# Patient Record
Sex: Female | Born: 1940
Health system: Southern US, Community
[De-identification: ages and names within clinical notes are randomized; demographics above are authoritative.]

## PROBLEM LIST (undated history)

## (undated) DIAGNOSIS — M1990 Unspecified osteoarthritis, unspecified site: Secondary | ICD-10-CM

## (undated) DIAGNOSIS — M109 Gout, unspecified: Secondary | ICD-10-CM

## (undated) DIAGNOSIS — I82409 Acute embolism and thrombosis of unspecified deep veins of unspecified lower extremity: Secondary | ICD-10-CM

## (undated) DIAGNOSIS — J45909 Unspecified asthma, uncomplicated: Secondary | ICD-10-CM

## (undated) DIAGNOSIS — I4891 Unspecified atrial fibrillation: Secondary | ICD-10-CM

## (undated) DIAGNOSIS — I639 Cerebral infarction, unspecified: Secondary | ICD-10-CM

## (undated) DIAGNOSIS — I739 Peripheral vascular disease, unspecified: Secondary | ICD-10-CM

## (undated) DIAGNOSIS — I219 Acute myocardial infarction, unspecified: Secondary | ICD-10-CM

## (undated) HISTORY — DX: Unspecified osteoarthritis, unspecified site: M19.90

## (undated) HISTORY — PX: FOOT SURGERY: SHX648

## (undated) HISTORY — DX: Peripheral vascular disease, unspecified: I73.9

## (undated) HISTORY — DX: Gout, unspecified: M10.9

## (undated) HISTORY — PX: JOINT REPLACEMENT: SHX530

## (undated) HISTORY — PX: SHOULDER SURGERY: SHX246

---

## 1981-12-26 HISTORY — PX: BREAST EXCISIONAL BIOPSY: SUR124

## 1998-04-16 ENCOUNTER — Encounter: Admission: RE | Admit: 1998-04-16 | Discharge: 1998-04-16 | Payer: Self-pay | Admitting: Obstetrics

## 1998-04-23 ENCOUNTER — Ambulatory Visit (HOSPITAL_COMMUNITY): Admission: RE | Admit: 1998-04-23 | Discharge: 1998-04-23 | Payer: Self-pay | Admitting: Obstetrics

## 1998-09-04 ENCOUNTER — Ambulatory Visit (HOSPITAL_COMMUNITY): Admission: RE | Admit: 1998-09-04 | Discharge: 1998-09-04 | Payer: Self-pay | Admitting: Obstetrics

## 1998-10-01 ENCOUNTER — Other Ambulatory Visit: Admission: RE | Admit: 1998-10-01 | Discharge: 1998-10-01 | Payer: Self-pay | Admitting: Obstetrics

## 1998-10-01 ENCOUNTER — Encounter: Admission: RE | Admit: 1998-10-01 | Discharge: 1998-10-01 | Payer: Self-pay | Admitting: Obstetrics

## 1998-10-01 ENCOUNTER — Ambulatory Visit (HOSPITAL_COMMUNITY): Admission: RE | Admit: 1998-10-01 | Discharge: 1998-10-01 | Payer: Self-pay | Admitting: Obstetrics

## 1998-11-05 ENCOUNTER — Encounter: Admission: RE | Admit: 1998-11-05 | Discharge: 1998-11-05 | Payer: Self-pay | Admitting: Obstetrics

## 1999-01-18 ENCOUNTER — Inpatient Hospital Stay (HOSPITAL_COMMUNITY): Admission: AD | Admit: 1999-01-18 | Discharge: 1999-01-20 | Payer: Self-pay | Admitting: Cardiology

## 1999-01-18 ENCOUNTER — Encounter: Payer: Self-pay | Admitting: Cardiology

## 1999-02-18 ENCOUNTER — Encounter: Admission: RE | Admit: 1999-02-18 | Discharge: 1999-02-18 | Payer: Self-pay | Admitting: Obstetrics

## 1999-03-04 ENCOUNTER — Encounter: Admission: RE | Admit: 1999-03-04 | Discharge: 1999-03-04 | Payer: Self-pay | Admitting: Obstetrics

## 1999-03-11 ENCOUNTER — Encounter: Admission: RE | Admit: 1999-03-11 | Discharge: 1999-03-11 | Payer: Self-pay | Admitting: Obstetrics

## 1999-03-18 ENCOUNTER — Encounter: Admission: RE | Admit: 1999-03-18 | Discharge: 1999-03-18 | Payer: Self-pay | Admitting: Obstetrics

## 1999-09-13 ENCOUNTER — Ambulatory Visit (HOSPITAL_COMMUNITY): Admission: RE | Admit: 1999-09-13 | Discharge: 1999-09-13 | Payer: Self-pay | Admitting: Obstetrics

## 1999-09-23 ENCOUNTER — Encounter: Admission: RE | Admit: 1999-09-23 | Discharge: 1999-09-23 | Payer: Self-pay | Admitting: Obstetrics

## 1999-10-21 ENCOUNTER — Encounter: Admission: RE | Admit: 1999-10-21 | Discharge: 1999-10-21 | Payer: Self-pay | Admitting: Obstetrics

## 1999-11-09 ENCOUNTER — Encounter: Payer: Self-pay | Admitting: Obstetrics

## 1999-11-09 ENCOUNTER — Ambulatory Visit (HOSPITAL_COMMUNITY): Admission: RE | Admit: 1999-11-09 | Discharge: 1999-11-09 | Payer: Self-pay | Admitting: Obstetrics

## 2000-03-09 ENCOUNTER — Encounter: Admission: RE | Admit: 2000-03-09 | Discharge: 2000-03-09 | Payer: Self-pay | Admitting: Obstetrics

## 2000-03-16 ENCOUNTER — Encounter: Admission: RE | Admit: 2000-03-16 | Discharge: 2000-03-16 | Payer: Self-pay | Admitting: Obstetrics

## 2000-03-23 ENCOUNTER — Encounter: Admission: RE | Admit: 2000-03-23 | Discharge: 2000-03-23 | Payer: Self-pay | Admitting: Obstetrics

## 2000-09-14 ENCOUNTER — Ambulatory Visit (HOSPITAL_COMMUNITY): Admission: RE | Admit: 2000-09-14 | Discharge: 2000-09-14 | Payer: Self-pay | Admitting: Obstetrics

## 2000-09-21 ENCOUNTER — Other Ambulatory Visit: Admission: RE | Admit: 2000-09-21 | Discharge: 2000-09-21 | Payer: Self-pay | Admitting: Obstetrics

## 2000-09-21 ENCOUNTER — Encounter: Admission: RE | Admit: 2000-09-21 | Discharge: 2000-09-21 | Payer: Self-pay | Admitting: Obstetrics

## 2000-10-26 ENCOUNTER — Encounter: Payer: Self-pay | Admitting: Obstetrics

## 2000-10-26 ENCOUNTER — Ambulatory Visit (HOSPITAL_COMMUNITY): Admission: RE | Admit: 2000-10-26 | Discharge: 2000-10-26 | Payer: Self-pay | Admitting: Obstetrics

## 2001-01-09 ENCOUNTER — Encounter: Admission: RE | Admit: 2001-01-09 | Discharge: 2001-01-09 | Payer: Self-pay | Admitting: Obstetrics & Gynecology

## 2001-02-27 ENCOUNTER — Ambulatory Visit (HOSPITAL_COMMUNITY): Admission: RE | Admit: 2001-02-27 | Discharge: 2001-02-27 | Payer: Self-pay | Admitting: Gastroenterology

## 2001-06-04 ENCOUNTER — Ambulatory Visit (HOSPITAL_COMMUNITY): Admission: RE | Admit: 2001-06-04 | Discharge: 2001-06-04 | Payer: Self-pay | Admitting: Cardiology

## 2001-06-04 ENCOUNTER — Encounter: Payer: Self-pay | Admitting: Cardiology

## 2001-09-18 ENCOUNTER — Ambulatory Visit (HOSPITAL_COMMUNITY): Admission: RE | Admit: 2001-09-18 | Discharge: 2001-09-18 | Payer: Self-pay | Admitting: Obstetrics

## 2001-10-04 ENCOUNTER — Encounter: Admission: RE | Admit: 2001-10-04 | Discharge: 2001-10-04 | Payer: Self-pay | Admitting: Obstetrics

## 2001-12-26 HISTORY — PX: BACK SURGERY: SHX140

## 2002-01-24 ENCOUNTER — Encounter: Admission: RE | Admit: 2002-01-24 | Discharge: 2002-01-24 | Payer: Self-pay | Admitting: Obstetrics

## 2002-02-01 ENCOUNTER — Ambulatory Visit (HOSPITAL_COMMUNITY): Admission: RE | Admit: 2002-02-01 | Discharge: 2002-02-01 | Payer: Self-pay | Admitting: *Deleted

## 2002-02-26 ENCOUNTER — Encounter: Payer: Self-pay | Admitting: Orthopedic Surgery

## 2002-03-01 ENCOUNTER — Inpatient Hospital Stay (HOSPITAL_COMMUNITY): Admission: EM | Admit: 2002-03-01 | Discharge: 2002-03-02 | Payer: Self-pay | Admitting: Orthopedic Surgery

## 2002-03-06 ENCOUNTER — Encounter: Admission: RE | Admit: 2002-03-06 | Discharge: 2002-06-04 | Payer: Self-pay | Admitting: Orthopedic Surgery

## 2002-06-05 ENCOUNTER — Encounter: Admission: RE | Admit: 2002-06-05 | Discharge: 2002-08-20 | Payer: Self-pay | Admitting: Orthopedic Surgery

## 2002-06-22 ENCOUNTER — Inpatient Hospital Stay (HOSPITAL_COMMUNITY): Admission: EM | Admit: 2002-06-22 | Discharge: 2002-06-25 | Payer: Self-pay | Admitting: General Surgery

## 2002-10-22 ENCOUNTER — Encounter: Admission: RE | Admit: 2002-10-22 | Discharge: 2002-11-18 | Payer: Self-pay | Admitting: Orthopedic Surgery

## 2003-01-27 ENCOUNTER — Encounter: Payer: Self-pay | Admitting: Obstetrics

## 2003-01-27 ENCOUNTER — Ambulatory Visit (HOSPITAL_COMMUNITY): Admission: RE | Admit: 2003-01-27 | Discharge: 2003-01-27 | Payer: Self-pay | Admitting: Obstetrics

## 2003-03-03 ENCOUNTER — Ambulatory Visit (HOSPITAL_COMMUNITY): Admission: RE | Admit: 2003-03-03 | Discharge: 2003-03-03 | Payer: Self-pay | Admitting: Cardiology

## 2003-03-03 ENCOUNTER — Encounter: Payer: Self-pay | Admitting: Cardiology

## 2003-03-17 ENCOUNTER — Encounter: Payer: Self-pay | Admitting: Orthopedic Surgery

## 2003-03-19 ENCOUNTER — Encounter: Payer: Self-pay | Admitting: Orthopedic Surgery

## 2003-03-19 ENCOUNTER — Inpatient Hospital Stay (HOSPITAL_COMMUNITY): Admission: RE | Admit: 2003-03-19 | Discharge: 2003-03-24 | Payer: Self-pay | Admitting: Orthopedic Surgery

## 2003-04-14 ENCOUNTER — Encounter: Admission: RE | Admit: 2003-04-14 | Discharge: 2003-06-06 | Payer: Self-pay | Admitting: Orthopedic Surgery

## 2003-09-30 ENCOUNTER — Encounter: Admission: RE | Admit: 2003-09-30 | Discharge: 2003-10-30 | Payer: Self-pay | Admitting: Orthopedic Surgery

## 2003-12-01 ENCOUNTER — Ambulatory Visit (HOSPITAL_COMMUNITY): Admission: RE | Admit: 2003-12-01 | Discharge: 2003-12-01 | Payer: Self-pay | Admitting: Cardiology

## 2004-01-13 ENCOUNTER — Inpatient Hospital Stay (HOSPITAL_COMMUNITY): Admission: RE | Admit: 2004-01-13 | Discharge: 2004-01-17 | Payer: Self-pay | Admitting: Orthopedic Surgery

## 2004-03-02 ENCOUNTER — Encounter: Admission: RE | Admit: 2004-03-02 | Discharge: 2004-05-31 | Payer: Self-pay | Admitting: Orthopedic Surgery

## 2004-07-07 ENCOUNTER — Ambulatory Visit (HOSPITAL_COMMUNITY): Admission: RE | Admit: 2004-07-07 | Discharge: 2004-07-07 | Payer: Self-pay | Admitting: Obstetrics

## 2004-07-28 ENCOUNTER — Ambulatory Visit (HOSPITAL_COMMUNITY): Admission: RE | Admit: 2004-07-28 | Discharge: 2004-07-28 | Payer: Self-pay | Admitting: Cardiology

## 2004-08-13 ENCOUNTER — Inpatient Hospital Stay (HOSPITAL_COMMUNITY): Admission: RE | Admit: 2004-08-13 | Discharge: 2004-08-25 | Payer: Self-pay | Admitting: Specialist

## 2005-02-17 ENCOUNTER — Encounter: Admission: RE | Admit: 2005-02-17 | Discharge: 2005-02-17 | Payer: Self-pay | Admitting: Specialist

## 2005-04-14 ENCOUNTER — Encounter: Admission: RE | Admit: 2005-04-14 | Discharge: 2005-04-14 | Payer: Self-pay | Admitting: Nephrology

## 2005-07-13 ENCOUNTER — Encounter: Admission: RE | Admit: 2005-07-13 | Discharge: 2005-07-13 | Payer: Self-pay | Admitting: Unknown Physician Specialty

## 2005-10-13 ENCOUNTER — Ambulatory Visit (HOSPITAL_COMMUNITY): Admission: RE | Admit: 2005-10-13 | Discharge: 2005-10-13 | Payer: Self-pay | Admitting: Obstetrics

## 2006-08-09 ENCOUNTER — Ambulatory Visit (HOSPITAL_COMMUNITY): Admission: RE | Admit: 2006-08-09 | Discharge: 2006-08-09 | Payer: Self-pay | Admitting: Obstetrics

## 2006-08-17 ENCOUNTER — Encounter: Admission: RE | Admit: 2006-08-17 | Discharge: 2006-08-17 | Payer: Self-pay | Admitting: Obstetrics

## 2006-11-13 ENCOUNTER — Ambulatory Visit: Admission: RE | Admit: 2006-11-13 | Discharge: 2006-11-13 | Payer: Self-pay | Admitting: Orthopedic Surgery

## 2007-05-11 ENCOUNTER — Ambulatory Visit: Payer: Self-pay | Admitting: Vascular Surgery

## 2007-05-11 ENCOUNTER — Ambulatory Visit: Admission: RE | Admit: 2007-05-11 | Discharge: 2007-05-11 | Payer: Self-pay | Admitting: Orthopedic Surgery

## 2007-06-22 ENCOUNTER — Ambulatory Visit (HOSPITAL_COMMUNITY): Admission: RE | Admit: 2007-06-22 | Discharge: 2007-06-22 | Payer: Self-pay | Admitting: Orthopedic Surgery

## 2007-08-03 ENCOUNTER — Ambulatory Visit (HOSPITAL_COMMUNITY): Admission: RE | Admit: 2007-08-03 | Discharge: 2007-08-03 | Payer: Self-pay | Admitting: Orthopedic Surgery

## 2007-10-12 ENCOUNTER — Ambulatory Visit (HOSPITAL_COMMUNITY): Admission: RE | Admit: 2007-10-12 | Discharge: 2007-10-12 | Payer: Self-pay | Admitting: Internal Medicine

## 2007-11-07 ENCOUNTER — Ambulatory Visit (HOSPITAL_COMMUNITY): Admission: RE | Admit: 2007-11-07 | Discharge: 2007-11-07 | Payer: Self-pay | Admitting: Internal Medicine

## 2007-11-26 ENCOUNTER — Inpatient Hospital Stay (HOSPITAL_COMMUNITY): Admission: RE | Admit: 2007-11-26 | Discharge: 2007-11-29 | Payer: Self-pay | Admitting: Orthopedic Surgery

## 2008-11-07 ENCOUNTER — Encounter: Admission: RE | Admit: 2008-11-07 | Discharge: 2008-11-07 | Payer: Self-pay | Admitting: Internal Medicine

## 2009-08-21 ENCOUNTER — Emergency Department (HOSPITAL_COMMUNITY): Admission: EM | Admit: 2009-08-21 | Discharge: 2009-08-21 | Payer: Self-pay | Admitting: Emergency Medicine

## 2009-11-25 ENCOUNTER — Encounter: Admission: RE | Admit: 2009-11-25 | Discharge: 2009-11-25 | Payer: Self-pay | Admitting: Internal Medicine

## 2009-12-22 ENCOUNTER — Inpatient Hospital Stay (HOSPITAL_BASED_OUTPATIENT_CLINIC_OR_DEPARTMENT_OTHER): Admission: RE | Admit: 2009-12-22 | Discharge: 2009-12-22 | Payer: Self-pay | Admitting: Cardiology

## 2010-07-30 ENCOUNTER — Emergency Department (HOSPITAL_COMMUNITY): Admission: EM | Admit: 2010-07-30 | Discharge: 2010-07-30 | Payer: Self-pay | Admitting: Emergency Medicine

## 2010-09-23 ENCOUNTER — Encounter
Admission: RE | Admit: 2010-09-23 | Discharge: 2010-12-15 | Payer: Self-pay | Source: Home / Self Care | Attending: Orthopedic Surgery | Admitting: Orthopedic Surgery

## 2010-12-17 ENCOUNTER — Ambulatory Visit (HOSPITAL_COMMUNITY)
Admission: RE | Admit: 2010-12-17 | Discharge: 2010-12-17 | Payer: Self-pay | Source: Home / Self Care | Attending: Obstetrics | Admitting: Obstetrics

## 2010-12-31 ENCOUNTER — Ambulatory Visit: Admit: 2010-12-31 | Payer: Self-pay | Attending: Vascular Surgery | Admitting: Vascular Surgery

## 2010-12-31 ENCOUNTER — Ambulatory Visit
Admission: RE | Admit: 2010-12-31 | Discharge: 2010-12-31 | Payer: Self-pay | Source: Home / Self Care | Attending: Vascular Surgery | Admitting: Vascular Surgery

## 2011-01-04 ENCOUNTER — Ambulatory Visit
Admission: RE | Admit: 2011-01-04 | Discharge: 2011-01-04 | Payer: Self-pay | Source: Home / Self Care | Attending: Vascular Surgery | Admitting: Vascular Surgery

## 2011-01-13 ENCOUNTER — Ambulatory Visit (HOSPITAL_COMMUNITY)
Admission: RE | Admit: 2011-01-13 | Discharge: 2011-01-14 | Payer: Self-pay | Source: Home / Self Care | Attending: Vascular Surgery | Admitting: Vascular Surgery

## 2011-01-16 ENCOUNTER — Encounter: Payer: Self-pay | Admitting: Obstetrics

## 2011-01-16 ENCOUNTER — Encounter: Payer: Self-pay | Admitting: Unknown Physician Specialty

## 2011-01-16 NOTE — Op Note (Addendum)
[name], [name]         ACCOUNT NO.:  [id number]  MEDICAL RECORD NO.:  [id number]          PATIENT TYPE:  OIB  LOCATION:  6531                         FACILITY:  [facility]  PHYSICIAN:  Fransisco Hertz, MD       DATE OF BIRTH:  [birth year]  DATE OF PROCEDURE:  01/13/2011 DATE OF DISCHARGE:                              OPERATIVE REPORT   PROCEDURES: 1. Left common femoral artery cannulation under ultrasound guidance. 2. Aortogram. 3. Bilateral leg runoff. 4. Third-order arterial selection. 5. Angioplasty x2 of the right tibioperoneal trunk with 1.5-mm x 20-mm     balloon and a 2.5-mm x 18-mm balloon.  PREOPERATIVE DIAGNOSIS:  Right greater than left toe ischemia.  POSTOPERATIVE DIAGNOSIS:  Right greater than left toe ischemia.  SURGEON:  Fransisco Hertz, MD  ANESTHESIA:  Conscious sedation.  CONTRAST:  185 mL.  FINDINGS: 1. Patent aorta. 2. Patent bilateral renal arteries with bilateral nephrograms. 3. Patent bilateral common internal and external iliac arteries. 4. Patent bilateral common superficial and profunda arteries and also     patent bilateral popliteal artery. 5. The left trifurcation was patent. 6. The left tibial peroneal trunk was patent. 7. The left anterior tibial artery had a greater than 90% stenosis at     approximately one-third of the way down from its takeoff. 8. The left posterior tibial and peroneal arteries continued onto the     foot as runoff, but are very small.  The dominant runoff is     posterior tibial. 9. The right anterior tibial artery occludes shortly after takeoff. 10.The right tibioperoneal trunk has greater than 90% stenosis     proximal to its bifurcation into the peroneal and posterior tibial     arteries. 11.The right peroneal and posterior tibial arteries continue onto the     foot with the posterior tibial being the dominant runoff, but these     vessels are very small. 12.Resolution of tibial peroneal trunk stenosis after  angioplasty x2.  INDICATIONS:  This is a 70 year old female who presents with right toe wound after toe nail removal that are not healing.  Additionally on the left side, she has some ischemic changes in one of her toes on the left side.  The patient was more symptomatic with the right foot, so we discussed intervening on her right leg first.  She is aware of the risks of these procedure including possible access complication, possible nephrotoxicity, possible embolization, and possible need for emergent surgical intervention. The patient is aware of these risks and agrees to proceed forward with the procedure.  DESCRIPTION OF OPERATION:  After full informed written consent had been obtained from the patient, she was brought back to the angio suite and placed supine upon the angio table.  She was connected to the monitoring equipment and then she was given conscious sedation, the amounts of which were documented in the chart.  She was then prepped and draped in a standard fashion for aortogram and bilateral leg runoff.  I turned first my attention to the left groin under ultrasound guidance.  I identified the left common femoral artery that was cannulated with 18 gauge  needle and a Bentson wire was passed up into the aorta.  The needle was then exchanged for a 5-French sheath.  Then, an Omni flush catheter was loaded over the wire up the level of L1.  The wire was removed.  The catheter was connected to power injector circuit, and a power injector aortogram was completed, the findings of which are listed above.  Based on these findings, I then pulled down the catheter down to just proximal to the iliac bifurcation. Bilateral leg runoffs were then completed in an automated fashion, findings of which are listed above.  Based on the findings, I felt that she does not have a surgical option in her right leg due to the small  size of any target vessels and due to the changes in her toes if  she did  not have an attempt at angioplasty of the tibioperoneal trunk she would possibly require amputation of the right foot, so at this point I decided to make an attempt the intervening on the right tibioperoneal trunk.  At this point, using a Glidewire and the Omni flush catheter I was able to navigate through the right iliac artery system and then I was able to advance the catheter to the level of the external iliac artery.  I, at this point, connected the catheter back to the power injector circuit and did a couple of power injector runs to conform the tibioperoneal findings. At this point, I then disconnected the catheter, put the wire back down, and advanced the catheter down into the superficial femoral artery.  At this point, the wire was exchanged for [facility] wire.  The catheter was removed.  The 5-French sheath was then exchanged for a 6-French Ansel sheath which was advanced down into the superficial femoral artery.   Please note that after placement of the Ansel sheath, I gave 6000 units  of heparin for a therapeutic bolus of anticoagulation.  The end-hole catheter then was loaded over the Rosen wire and advanced the Tarrant wire down to about the mid SFA.  I removed the Rosen wire exchanging it for a 0.018-inch Spartacore wire which I used to navigate down into the posterior tibial artery.  At this point, then I performed a hand injection to confirm the position of the tibioperoneal trunk stenosis.  Initially, I loaded a 1.5-mm x 20-mm angioplasty balloon and centered it around the area of stenosis and inflated it to 10 atmospheres for a minute.  Due to the length of the lesion, I had to do 2 inflations to treat the entirety of this lesion, deflated the balloon, and then repeated a hand injection at this area after pulling the balloon back.  It demonstrated greater than 75% residual stenosis in the proximal portion of this tibial peroneal trunk stenosis, so at this point I  obtained a 2.5-mm x 18-mm angioplasty balloon and advanced it down to the level of this tibioperoneal trunk stenosis and inflated this twice to cover the entire length of this lesion, also up to 10 atmospheres at a minute hold at each inflation.  I deflated the balloon, and completed another hand injection.  There was still greater than 30% stenosis in the proximal segment of this stenosis, so I centered around this area of stenotic tibioperoneal trunk, inflated it to 14 atmospheres, held it for 1 minute, and deflated the balloon, pulled it back. Hand injection of this point demonstrated complete resolution of this tibioperoneal trunk stenosis with no obvious signs of dissection.  Due to  the fragile nature of the tibial arteries and a relatively small size of the peroneal and posterior tibial, I did not think any further intervention was possible at this point.  I pulled out the wire and the balloon.  I pulled back the sheath up into the aorta and down into the external iliac artery.  At this point, we aspirated the sheath and there was no clot, flushed it with heparinized saline.  I planned to remove it in the holding area once anticoagulation had reversed.    COMPLICATIONS:  None.  CONDITION:  Stable.     Fransisco Hertz, MD     BLC/MEDQ  D:  01/13/2011  T:  01/14/2011  Job:  [id number]  Electronically Signed by Leonides Sake MD on 01/16/2011 05:45:57 PM

## 2011-01-17 LAB — POCT I-STAT, CHEM 8
HCT: 47 % — ABNORMAL HIGH (ref 36.0–46.0)
Hemoglobin: 16 g/dL — ABNORMAL HIGH (ref 12.0–15.0)
Sodium: 144 mEq/L (ref 135–145)
TCO2: 34 mmol/L (ref 0–100)

## 2011-01-17 LAB — BASIC METABOLIC PANEL
CO2: 30 mEq/L (ref 19–32)
Calcium: 9.2 mg/dL (ref 8.4–10.5)
Chloride: 106 mEq/L (ref 96–112)
Creatinine, Ser: 0.8 mg/dL (ref 0.4–1.2)
GFR calc non Af Amer: >60 mL/min (ref >60)
Glucose, Bld: 104 mg/dL — ABNORMAL HIGH (ref 70–99)
Potassium: 3.7 mEq/L (ref 3.5–5.1)
Sodium: 142 mEq/L (ref 135–145)

## 2011-01-17 LAB — CBC
HCT: 37.4 % (ref 36.0–46.0)
MCH: 28.3 pg (ref 26.0–34.0)
Platelets: 166 10*3/uL (ref 150–400)
RBC: 4.21 MIL/uL (ref 3.87–5.11)

## 2011-01-17 LAB — GLUCOSE, CAPILLARY
Glucose-Capillary: 79 mg/dL (ref 70–99)
Glucose-Capillary: 83 mg/dL (ref 70–99)

## 2011-01-20 LAB — POCT ACTIVATED CLOTTING TIME
Activated Clotting Time: 288 seconds
Activated Clotting Time: 181 seconds
Activated Clotting Time: 164 seconds
Activated Clotting Time: 152 seconds
Activated Clotting Time: 152 seconds

## 2011-01-25 ENCOUNTER — Ambulatory Visit
Admission: RE | Admit: 2011-01-25 | Discharge: 2011-01-25 | Payer: Self-pay | Source: Home / Self Care | Attending: Vascular Surgery | Admitting: Vascular Surgery

## 2011-01-26 NOTE — Assessment & Plan Note (Signed)
OFFICE VISIT  [name], [name] DOB:  01-06-1941                                       01/25/2011 ZOXWR#:[id number]  The patient was evaluated by me on January 10 for some pre-ischemic changes of the third and fourth toes of the right foot, referred by Dr. Leticia Penna.  She was felt to have primarily tibial occlusive disease.  She did undergo angiography by Dr. Leonides Sake on January 19 and was found to have primarily tibial occlusive disease.  She did have a 90% stenosis in the right tibioperoneal trunk, which was treated with primary angioplasty with a reasonably good result.  Since that time she states that the foot has felt better but she continues to have some discomfort in the right third and fourth toes.  She had no gangrene or drainage, she says.  No symptoms in the left foot at the present time.  CHRONIC MEDICAL PROBLEMS: 1. Coronary artery disease. 2. Hypertension. 3. Previous CVA with right hemiparesis, resolved. 4. Type 2 diabetes mellitus. 5. GERD.  SOCIAL HISTORY:  The patient denies tobacco or alcohol use.  REVIEW OF SYSTEMS:  Negative for chest pain, dyspnea on exertion, PND, orthopnea, chronic bronchitis.  PHYSICAL EXAMINATION:  Blood pressure 147/74, heart rate 77, respirations 20.  General:  She is an elderly female in no apparent distress, alert and oriented x3.  Lungs:  Clear to auscultation. Cardiovascular:  Regular rhythm, no murmurs.  Abdomen:  Soft, nontender, with no masses.  Lower extremity exam reveals 3+ femoral and popliteal pulses bilaterally.  The right third and fourth toes are chronically ischemic but no evidence of any gangrene or ulceration.  She was reassured about the fact that this was treated with balloon angioplasty and that we have no further options available because of her distal tibial disease and small vessels.  She will return to the care Dr. Leticia Penna.  We will follow her on a regular basis in the  vascular lab but no further intervention is anticipated for this patient with severe tibial disease.    [name], M.D. Electronically Signed  JDL/MEDQ  D:  01/25/2011  T:  01/26/2011  Job:  [id number]

## 2011-02-08 ENCOUNTER — Other Ambulatory Visit (HOSPITAL_COMMUNITY): Payer: Self-pay

## 2011-02-08 ENCOUNTER — Ambulatory Visit (HOSPITAL_COMMUNITY): Payer: Medicare Other

## 2011-02-08 ENCOUNTER — Other Ambulatory Visit: Payer: Self-pay | Admitting: Anesthesiology

## 2011-02-08 DIAGNOSIS — Z01812 Encounter for preprocedural laboratory examination: Secondary | ICD-10-CM | POA: Insufficient documentation

## 2011-02-08 LAB — BASIC METABOLIC PANEL
CO2: 33 mEq/L — ABNORMAL HIGH (ref 19–32)
Creatinine, Ser: 0.79 mg/dL (ref 0.4–1.2)
GFR calc Af Amer: >60 mL/min (ref >60)
Glucose, Bld: 90 mg/dL (ref 70–99)
Sodium: 142 mEq/L (ref 135–145)

## 2011-02-09 ENCOUNTER — Ambulatory Visit (HOSPITAL_COMMUNITY)
Admission: RE | Admit: 2011-02-09 | Discharge: 2011-02-09 | Disposition: A | Discharge status: Home / Self Care | Payer: Medicare Other | Source: Ambulatory Visit | Attending: Gastroenterology | Admitting: Gastroenterology

## 2011-02-09 ENCOUNTER — Other Ambulatory Visit: Payer: Self-pay | Admitting: Gastroenterology

## 2011-02-09 DIAGNOSIS — D126 Benign neoplasm of colon, unspecified: Secondary | ICD-10-CM | POA: Insufficient documentation

## 2011-02-09 DIAGNOSIS — Z1211 Encounter for screening for malignant neoplasm of colon: Secondary | ICD-10-CM | POA: Insufficient documentation

## 2011-02-09 DIAGNOSIS — I4891 Unspecified atrial fibrillation: Secondary | ICD-10-CM | POA: Insufficient documentation

## 2011-02-09 DIAGNOSIS — I2510 Atherosclerotic heart disease of native coronary artery without angina pectoris: Secondary | ICD-10-CM | POA: Insufficient documentation

## 2011-02-09 DIAGNOSIS — I739 Peripheral vascular disease, unspecified: Secondary | ICD-10-CM | POA: Insufficient documentation

## 2011-02-09 DIAGNOSIS — I69959 Hemiplegia and hemiparesis following unspecified cerebrovascular disease affecting unspecified side: Secondary | ICD-10-CM | POA: Insufficient documentation

## 2011-02-09 DIAGNOSIS — K219 Gastro-esophageal reflux disease without esophagitis: Secondary | ICD-10-CM | POA: Insufficient documentation

## 2011-02-09 DIAGNOSIS — I10 Essential (primary) hypertension: Secondary | ICD-10-CM | POA: Insufficient documentation

## 2011-02-09 DIAGNOSIS — I252 Old myocardial infarction: Secondary | ICD-10-CM | POA: Insufficient documentation

## 2011-02-09 DIAGNOSIS — E119 Type 2 diabetes mellitus without complications: Secondary | ICD-10-CM | POA: Insufficient documentation

## 2011-02-09 DIAGNOSIS — Z01812 Encounter for preprocedural laboratory examination: Secondary | ICD-10-CM | POA: Insufficient documentation

## 2011-03-28 LAB — POCT I-STAT GLUCOSE
Glucose, Bld: 108 mg/dL — ABNORMAL HIGH (ref 70–99)
Operator id: 141321

## 2011-03-28 LAB — PROTIME-INR: Prothrombin Time: 14.2 seconds (ref 11.6–15.2)

## 2011-04-02 LAB — GLUCOSE, CAPILLARY: Glucose-Capillary: 115 mg/dL — ABNORMAL HIGH (ref 70–99)

## 2011-05-10 NOTE — Discharge Summary (Signed)
[name], [name]         ACCOUNT NO.:  [id number]   MEDICAL RECORD NO.:  [id number]          PATIENT TYPE:  INP   LOCATION:  5006                         FACILITY:  [facility]   PHYSICIAN:  Almedia Balls. Ranell Patrick, M.D. DATE OF BIRTH:  12-02-1941   DATE OF ADMISSION:  11/26/2007  DATE OF DISCHARGE:  11/29/2007                               DISCHARGE SUMMARY   ADMISSION DIAGNOSIS:  End-stage osteoarthritis arthritis to the right  knee.   DISCHARGE DIAGNOSIS:  End-stage osteoarthritis arthritis to the right  knee,  status post total knee arthroplasty.   BRIEF HISTORY:  The patient is a did a 70 year old female with worsening  right knee pain.  Patient has elected have a right total knee  arthroplasty by Dr. Malon Kindle   PROCEDURE:  The patient had a right total knee arthroplasty using the  [employer] Rotating Platform System on November 26, 2007.   ATTENDING SURGEON:  Malon Kindle, M.D.   ASSISTANT:  Standley Dakins PA-C.   ANESTHESIA:  General anesthesia was used.   ESTIMATED BLOOD LOSS:  Minimal.   COMPLICATIONS.:  None.   HOSPITAL COURSE:  The patient was admitted on November 26, 2007  and  underwent the above-stated procedure, which she tolerated well. After  adequate time in the post anesthesia care unit she was transferred up to  5000.  Postop day #1 the patient complained about moderate pain to that  right knee.  She was able to work with a CPM but unable work very much  with physical therapy.  The drain was removed.  No signs of occult  infection.  Postop day #2 the patient had a dressing change.  She was  able to progress more with physical therapy.  She did have tough time  with touchdown weightbearing with basic partial weightbearing and the  whole reason, we kept her partial weightbearing was due to an arthrotomy  we had to complete during surgery for exposure. The patient was able to  continue working with physical therapy. On postop day #3 her labs were  within  acceptable limits in regards to blood loss anemia. Dressings were  changed daily.  No signs or symptoms of erythema, infection or rash.  She was intact distally.   DISCHARGE/PLAN:  The patient will be discharged to a skilled nursing  facility on November 29, 2007.  Her condition is stable.  Her diet is low  sodium.   ALLERGIES:  The patient has allergies to PENICILLIN.   DISCHARGE MEDICATIONS:  1. Tenormin 25 mg p.o. daily.  2. Colace 100 mg p.o. b.i.d.  3. Cymbalta 60 mg p.o. daily.  4. Iron 325 mg p.o. t.i.d.  5. Advair 1 puff inhaler b.i.d.  6. Lasix 20 mg p.o. daily.  7. Acular drops 1 drop to each eye b.i.d.  8. Robaxin 500 mg p.o. every 6 hours.  9. Zocor 20 mg p.o. daily.  10.Coumadin per pharmacy protocol.  11.Albuterol 1 puff inhaled every 6 hours p.r.n.  12.Meclizine 25 mg p.o. t.i.d.  13.Percocet 5 mg 1-2 tablets daily for 4-6 hours p.r.n. pain.  14.Ambien 5 mg p.o. q.h.s.  15.Phenergan 25 mg  p.o. every 4 hours p.r.n. nausea.   FOLLOW-UP:  The patient will follow back up with Dr. Malon Kindle in 2  weeks.   ACTIVITY:  The patient is to be touchdown weightbearing or partial  weightbearing on the right lower extremity with a rolling walker with  physical therapy and a CPM until we see her back.  CPM is to be set at 0  to 60 degrees and progressed to 90 degrees and increasing at about 5 to  10 degrees daily per tolerance.      [name] Nora, P.A.      Almedia Balls. Ranell Patrick, M.D.  Electronically Signed    TBD/MEDQ  D:  11/29/2007  T:  11/29/2007  Job:  [id number]

## 2011-05-10 NOTE — H&P (Signed)
NAMETHORA, [name] NO.:  [id number]   MEDICAL RECORD NO.:  [id number]          PATIENT TYPE:  OUT   LOCATION:  [facility]                          FACILITY:  [facility]   PHYSICIAN:  Ralene Ok, M.D.      DATE OF BIRTH:  [birth year]   DATE OF ADMISSION:  11/07/2007  DATE OF DISCHARGE:  11/07/2007                              HISTORY & PHYSICAL   CHIEF COMPLAINT:  Right knee pain.   HISTORY OF PRESENT ILLNESS:  The patient has been having this worsening  right knee pain secondary to osteoarthritis that has been refractory to  conservative treatment.  The patient is elected to have right total knee  arthroplasty by Dr. Malon Kindle.   PAST MEDICAL HISTORY:  Significant for:  1. CVA with hemiparesis.  2. Myocardial infarction x2.  3. Angina.  4. GERD.  5. Osteoarthritis.   FAMILY MEDICAL HISTORY:  Hypertension and coronary artery disease.   SOCIAL HISTORY:  A patient of Dr. Hyacinth Meeker.  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  Does not smoke or use alcohol.   ALLERGIES:  PENICILLIN.   CURRENT MEDICATIONS:  1. Tenormin 25 mg one tab p.o. daily.  2. Lasix 20 mg one tab p.o. daily.  3. Micro potassium 10 mEq one tab p.o. daily.  4. Aspirin 325 mg one tab p.o. daily.  5. Zegerid 40 mg one tab p.o. daily.  6. Cymbalta 60 mg daily.  7. Ascorbic acid 250 mg daily.  8. Vicodin 5 mg one to two every 4-6 hours p.r.n. pain.  9. Ketorolac Acular 2 drops left eye b.i.d.  10.Depakote 250 mg p.o. b.i.d.  11.Risperdal 0.5 mg b.i.d.  12.Albuterol inhaler p.r.n.  13.Zocor 20 mg p.o. daily.  14.Ambien 5 mg nightly.  15.Sublingual nitroglycerin 0.4 mg p.r.n. chest pain.  16.Advair Diskus p.r.n.  17.Phenergan 25 mg every 4-6 hours p.r.n. nausea.  18.Robaxin 500 mg p.o. every 6 hours.  19.Meclizine 25 mg one tab p.o. t.i.d. p.r.n. dizziness.   PHYSICAL EXAMINATION:  VITAL SIGNS:  Pulse 90, respirations 16, blood  pressure 138/88.  GENERAL:  The patient is a 70 year old female,  adequate mood today.  No  signs of distress.  NECK:  Full range of motion without any tenderness.  NEURO:  Cranial nerves II-XII grossly intact.  CHEST:  Active breath sounds bilaterally.  No wheezes, rhonchi, or  rales.  HEART:  Shows regular rate and rhythm.  No murmur.  ABDOMEN:  Nontender, nondistended with active bowel sounds.  EXTREMITIES:  Shows moderate tenderness of the right knee, especially in  the medial joint line with mild crepitus.  Does have some mild pedal  edema bilateral lower extremities.  Capillary refill is less than 2  seconds.  She is demonstrating an antalgic gait.  No signs of rashes.   X-RAYS:  Showed a right knee end-stage osteoarthritis.   IMPRESSION:  Right knee end-stage osteoarthritis.   PLAN:  Plan of action is a right total knee arthroplasty by Dr. Malon Kindle.      Thomas B. Ferne Coe.    ______________________________  Ralene Ok, M.D.    TBD/MEDQ  D:  11/15/2007  T:  11/16/2007  Job:  [id number]

## 2011-05-10 NOTE — Procedures (Signed)
LOWER EXTREMITY ARTERIAL DUPLEX   INDICATION:  Lower extremity ulcer.   HISTORY:  Diabetes:  Yes.  Cardiac:  MI in 1995.  Hypertension:  Yes.  Smoking:  No.  Previous Surgery:  No.   SINGLE LEVEL ARTERIAL EXAM                          RIGHT                LEFT  Brachial:               145                  142  Anterior tibial:        93                   134  Posterior tibial:       112                  141  Peroneal:  Ankle/Brachial Index:   0.77                 0.97.   LOWER EXTREMITY ARTERIAL DUPLEX EXAM   TOE BRACHIAL INDEX RIGHT:  0.46.   TOE BRACHIAL INDEX LEFT:  0.57   DUPLEX:  Patent right lower extremity arterial system with elevated  velocities noted in the posterior tibial artery of 243 cm/second.   IMPRESSION:  1. Moderately diseased right ankle brachial index and toe brachial      index.  2. Left ankle brachial index within normal limits.  3. Patent right arterial system with biphasic waveforms in the thigh      and monophasic waveforms in the calf.   ___________________________________________  Quita Skye. Hart Rochester, M.D.   EM/MEDQ  D:  12/31/2010  T:  12/31/2010  Job:  [id number]

## 2011-05-10 NOTE — Op Note (Signed)
[name], HE         ACCOUNT NO.:  [id number]   MEDICAL RECORD NO.:  [id number]          PATIENT TYPE:  AMB   LOCATION:  SDS                          FACILITY:  [facility]   PHYSICIAN:  Almedia Balls. Ranell Patrick, M.D. DATE OF BIRTH:  21-Dec-1941   DATE OF PROCEDURE:  08/03/2007  DATE OF DISCHARGE:  08/03/2007                               OPERATIVE REPORT   PREOPERATIVE DIAGNOSIS:  Right knee medial meniscus tear.   POSTOPERATIVE DIAGNOSIS:  Right knee lateral meniscus tear and  arthritis.   PROCEDURE PERFORMED:  Right knee arthroscopy with partial lateral  meniscectomy, abrasion chondroplasty lateral femoral condyle.   SURGEON:  Almedia Balls. Ranell Patrick, M.D.   ASSISTANT:  None.   Local anesthesia plus monitored anesthesia care was used.   ESTIMATED BLOOD LOSS:  Minimal.   FLUID REPLACEMENT:  1,200 mL crystalloid.   INSTRUMENT COUNT:  Correct.   No complications.   Preoperative antibiotics were given.   INDICATIONS:  The patient is a 70 year old female with a history of  right lateral and medial knee pain.  The patient was noted to have  finding consistent with a potential meniscus tear.  In the office, the  patient failed conservative management consisting of multiple  injections, presents now for operative treatment for meniscal tear.  Informed consent was obtained.   DESCRIPTION OF PROCEDURE:  After an adequate level of anesthesia was  achieved, the patient was positioned supine on the operating room table.  The lateral post was utilized.  The right leg was sterilely prepped and  draped in the usual manner.  After sterile prep and drape of the leg, we  went ahead and entered the knee arthroscopically with the standard  arthroscopic portals.  Superolateral outflow and anterior lateral scope  and anteromedial working portals were all created in a similar fashion  with infiltration of skin with 0.25% Marcaine with epinephrine followed  by incision with the 11 blade scalpel in  the direction of the cannula in  the joint using blunt obturators.  Diagnostic arthroscopy revealed a  torn lateral meniscus which was a complex tear, nonrepairable.  This was  debrided back to stable meniscal tissue involving removal of the  majority of the posterior horn of the meniscus.  There was a disruption  in the continuity of the meniscus both with the tear and of course with  the meniscectomy.  Abrasion chondroplasty was performed on several deep  areas of partial thickness chondromalacia grade 3.  There was also an  area of grade 4 chondromalacia on the tibial condyle.  We performed an  abrasion chondroplasty in the lateral compartment only.  We performed  extensive anterior synovectomy given there was a lot of hypertrophic  synovium as well as potentially impinging plaque lesion medially, we  removed that.  Upon further inspection, there was some deep trochlea  wear grade 3 chondromalacia present there with some chondroplasty  performed in that area.  Medially she actually looked pretty good with  minimal chondromalacia and no sign of obvious meniscus tear.  ACL was  degenerative but intact.  PCL was intact.  Following the lateral  meniscus tear and  abrasion chondroplasty in the lateral compartment and  patellofemoral compartment, we concluded surgery.  We sutured the wounds  with 3-0 nylon, followed by sterile dressing.  The patient tolerated the  surgery well.      Almedia Balls. Ranell Patrick, M.D.  Electronically Signed     SRN/MEDQ  D:  08/03/2007  T:  08/04/2007  Job:  [id number]

## 2011-05-10 NOTE — Discharge Summary (Signed)
NAMEALEXIS, REBER         ACCOUNT NO.:  [id number]   MEDICAL RECORD NO.:  [id number]          PATIENT TYPE:  INP   LOCATION:  5006                         FACILITY:  [facility]   PHYSICIAN:  Almedia Balls. Ranell Patrick, M.D. DATE OF BIRTH:  Mar 03, 1941   DATE OF ADMISSION:  11/26/2007  DATE OF DISCHARGE:  11/29/2007                               DISCHARGE SUMMARY   ADMISSION DIAGNOSIS:  End-stage arthritis, right knee.   DISCHARGE DIAGNOSIS:  End-stage arthritis, right knee.   PROCEDURE PERFORMED:  Right total knee replacement on November 26, 2007.   CONSULTING SERVICES:  Physical therapy, occupational therapy, and  discharge planning, and pharmacy consult.   HISTORY OF PRESENT ILLNESS:  The patient is a 70 year old female with  end-stage arthritis of the right knee.  The patient had significant  functional disability and worsening pain despite conservative  management.  The patient presents now for operative treatment to restore  function and eliminate pain.  For further details of the patient's past  medical history and physical examination, please see the medical record.   HOSPITAL COURSE:  Patient admitted orthopedic surgery and taken to  surgery on November 26, 2007 for a total knee arthroplasty on the right.  The patient did well postoperatively, remaining afebrile and tolerating  a regular diet.  She was kept on antibiotics for 24 hours.  She was  started on Coumadin per pharmacy protocol for DVT prophylaxis, and  management with mechanical devices.  She was up ambulating with physical  therapy prior to discharge.  She was weightbearing as tolerated.  Discharged on Percocet, Robaxin and Coumadin.  To follow up in  orthopedics in 2 weeks.  She was discharged over to skilled nursing  facility.      Almedia Balls. Ranell Patrick, M.D.  Electronically Signed     SRN/MEDQ  D:  12/17/2007  T:  12/18/2007  Job:  [id number]

## 2011-05-10 NOTE — Op Note (Signed)
NAMEDONALEE, GAUMOND         ACCOUNT NO.:  [id number]   MEDICAL RECORD NO.:  [id number]          PATIENT TYPE:  INP   LOCATION:  5006                         FACILITY:  [facility]   PHYSICIAN:  Almedia Balls. Ranell Patrick, M.D. DATE OF BIRTH:  1941/04/03   DATE OF PROCEDURE:  11/26/2007  DATE OF DISCHARGE:                               OPERATIVE REPORT   PREOPERATIVE DIAGNOSIS:  End-stage arthritis right knee.   POSTOPERATIVE DIAGNOSIS:  End-stage arthritis right knee.   PROCEDURE PERFORMED:  Right knee total knee replacement using DePuy  Sigma rotating platform prosthesis as well as tubercle osteotomy.   SURGEON:  Malon Kindle, M.d.   ASSISTANT:  Modesto Charon, [place]   ANESTHESIA:  General anesthesia plus femoral block anesthesia was used.   ESTIMATED BLOOD LOSS:  Minimal.   TOURNIQUET TIME:  2 hours and 7 minutes at 350 mmHg.   FLUIDS REPLACED:  1200 mL crystalloid.   URINE OUTPUT:  250 mL.   INDICATIONS:  The patient is a 70 year old female with history of prior  knee arthroscopy, indicating end-stage arthritis to her knee.  The  patient has failed conservative treatment including arthroscopy and has  had worsening pain to the point where it is affecting her activities of  daily living and her mobility.  She presents now for total knee  arthroplasty to remove pain and restore function and informed consent  was obtained.   DESCRIPTION OF PROCEDURE:  After adequate local anesthesia achieved,  patient placed supine on operating table.  Nonsterile tourniquet was  placed her proximal thigh. Right leg sterilely prepped and draped usual  manner.  We exsanguinated the limb using Esmarch bandage, elevated the  tourniquet to 350 mmHg.  Longitudinal incision was created with the knee  in flexion.  Median parapatellar arthrotomy was created.  The patient's  patella could not be  everted.  We attempted to sublux the patella and  because there appeared to be a little patella baja and  some tethering  despite releases the lateral patellofemoral ligament, we simply could  not gain adequate exposure.  Thus decision was made to performing  tubercle osteotomy using osteotomes and predrilling with a 3.2 drill bit  for 45 AO screw.  Once we performed the osteotomy, we were easily able  to access the distal femur, performed distal femoral cut removing 12 mm  distal femur.  We made additional 10 mm cut which did not get any of the  lateral femoral condyle.  We thus revised that to 2 mm additional  resection giving [place] 12 mm resection 5 degrees right. Then we went ahead  and cut our tibia, removing the ACL, PCL subluxing tibia anteriorly and  then performing a 90 degree perpendicular cut to the long axis of the  tibia.  We resected 2 to 4 mm off the affected side, checked with both  extension and flexion blocks and were happy with our resection, went  ahead and performed our 4 in 1 cut for the distal femur with both  anterior-posterior cuts.  We were nicely down on the anterior femoral  cortex with no notching and the posterior section appeared  appropriate.  Her flexion block was then checked and was appropriate 10 mm.  Balancing  with our extension block at 10, the chamfer cuts were made as well.  We  then went ahead and performed box cut for the posterior cruciate  substitute prosthesis and then sized our tibia to size 3, made our keel  punch and then went ahead and resurfaced our patella.  This was 25 mm  thick prior to resurfacing, we resected down to 15 and 16 and then  placed a 38 patellar button on, had nice patellar tracking and we had  removed all trial components.  We plugged the end of the femur with  available bone.  We then went ahead and cemented our components into  place, tibia first followed by the femur and followed by the patella.  We had resected posterior osteophytes off the distal femur and once the  cement was allowed to harden with the 10 mm insert in, we  trialed the 10  insert with the patella and we had excellent patellar tracking.  We went  ahead and removed the trial insert and inserted a real 10 posterior  stabilized size 3 polyethylene insert.  At this point we went ahead and  closed our tubercle left osteotomy back down and we secured that with  two 4.5 AO screws with washers, gaining good purchase.  We bone grafted  up onto the tray proximally where there was a little bit of gap between  the tubercle osteotomy.  Once that was done we went ahead and closed our  parapatellar arthrotomy with interrupted PDS suture #1 in a figure-of-  eight fashion followed by 0 Vicryl, then 2-0 Vicryl subcutaneous closure  and staples for the skin.  Sterile dressing applied followed by knee  immobilizer with knee in extension.  The patient tolerated surgery well.      Almedia Balls. Ranell Patrick, M.D.  Electronically Signed     SRN/MEDQ  D:  11/26/2007  T:  11/27/2007  Job:  [id number]

## 2011-05-10 NOTE — Consult Note (Signed)
NEW PATIENT CONSULTATION   [name], [name]  DOB:  [birth year]                                       01/04/2011  JWJXB#:[id number]   Patient is a 70 year old female referred by Dr. Illene Bolus for  evaluation of circulation in both lower extremities, right worse than  left.  This patient developed some discomfort in her toes (third and  fourth toe of the right foot) and had what she describes as removal of  the toenails of those 2 toes about 3-4 weeks ago in Dr. Illene Regulus  office.  This has not healed, and she has had increasing pain.  The  patient has never had a vascular evaluation and was referred to see if  she has adequate circulation to heal the toes.  She also had some mild  problem with the second toe of the left foot to a much lesser degree.  She does not ambulate much and denies severe claudication symptoms.   CHRONIC MEDICAL PROBLEMS:  1. Coronary artery disease, previous cardiac cath December 2010.  2. Hypertension.  3. Previous CVA with right hemiparesis, resolved.  4. Type 2 diabetes mellitus.  5. GERD.  6. Obesity.   SOCIAL HISTORY:  The patient denies tobacco or alcohol use.  No drug  abuse.  Lived in a nursing home in the past.  States she has moved out  of that now.   FAMILY HISTORY:  Negative for coronary artery disease, diabetes, and  stroke.   REVIEW OF SYSTEMS:  The patient had previous right hemiparesis, which  resolved, following her stroke in the past.  Does occasionally have  chest pain, dyspnea on exertion.  Denies any swallowing problems.  She  has had weight gain.  This is all of the review of systems that she  could recall.  All other systems are negative.   PHYSICAL EXAMINATION:  Blood pressure 141/82, heart rate 54,  respirations 14, temperature 97.9.  General:  She is an obese female  patient who is in no acute distress, alert and oriented x3.  HEENT:  Normal for age.  EOMs intact.  Pulmonary:  No rhonchi or  wheezing.  Cardiovascular:  Regular rhythm.  No murmurs.  Carotid pulses are 3+.  No bruits audible.  Abdomen:  Obese.  No palpable mass.  Musculoskeletal:  Free of major deformities.  Neurologic:  Normal.  Skin:  Free of rashes.  Lower extremity exam reveals 3+ femoral and  popliteal pulses.  There are no palpable distal pulses.  Right foot has  what appears to be removal of the third and fourth toenails with some  darkness in this area.  This is mildly tender.  There is no fluctuance  or cellulitis in the foot.  The left foot has some ischemic changes at  the tip of the second toe.   The patient had lower extremity arterial Doppler studies, which I  reviewed and interpreted.  She has a patent system on the right down to  the ankle with some high velocity in the distal posterior tibial artery.  ABI on the right is 0.77 with the toe brachial index of 0.46.  Left side  is 0.97 with a toe brachial index of 0.57.   I do not think this patient has any major occlusive disease with  primarily distal tibial disease and in the foot, and I  doubt that any  intervention will be helpful.  Because the toes are ischemic in  appearance, we will obtain an angiogram, which Dr. Imogene Burn will performed  on Thursday, the 19th of January, to see if she is a candidate for any  percutaneous intervention.  If not, then she will be referred back to  Dr. Leticia Penna for further follow-up.     [name], M.D.  Electronically Signed   JDL/MEDQ  D:  01/04/2011  T:  01/04/2011  Job:  [id number]   cc:   Sherlynn Stalls

## 2011-05-13 NOTE — Discharge Summary (Signed)
NAME:  [name], [name]                   ACCOUNT NO.:  [id number]   MEDICAL RECORD NO.:  [id number]                   PATIENT TYPE:  INP   LOCATION:  0457                                 FACILITY:  [facility]   PHYSICIAN:  Almedia Balls. Ranell Patrick, M.D.              DATE OF BIRTH:  10/09/1941   DATE OF ADMISSION:  03/19/2003  DATE OF DISCHARGE:  03/24/2003                                 DISCHARGE SUMMARY   ADMISSION DIAGNOSES:  1. Severe osteoarthritis of the left knee.  2. Hypertension.  3. History of myocardial infarction x2.  4. Coronary artery disease.  5. Anxiety (mild).  6. Gastroesophageal reflux disease.   DISCHARGE DIAGNOSES:  1. Severe osteoarthritis of the left knee.  2. Hypertension.  3. History of myocardial infarction x2.  4. Coronary artery disease.  5. Anxiety (mild).  6. Gastroesophageal reflux disease.  7. Mild postoperative anemia.   OPERATION:  On March 19, 2003 the patient underwent a left total knee  replacement arthroplasty, all three components cemented, Ollen Gross, M.D.  primary assistant, Clarene Reamer, P.A.-C. secondary assistant.   BRIEF HISTORY:  This is a 70 year old black female who has a long history of  left knee pain.  She has increased pain with activity but also pain with  rest.  We tried her on nonsteroidal anti-inflammatories as well as ice and  therapy as well as intra-articular cortisone injections and unfortunately  she has not responded to that treatment.  X-rays have shown a deteriorating  joint in the presence of osteoarthritis.  After much discussion and  consideration, it is felt this patient would benefit with surgical  intervention and was admitted for the above procedure.   HOSPITAL COURSE:  The patient tolerated the surgical procedure quite well.  She was placed on Coumadin protocol for postoperative prevention of DVT and  continued that during her hospitalization and will do so four weeks after  the date of surgery.  We  were able to discontinue the Hemovac on the first  postop day without difficulty.  Also weaned from PCA as well as discontinued  the Foley.  The patient progressed with physical therapy, eventually  achieving 85degrees in CPM machine, ambulating 200 feet in the hall.  We had  anticipated that she may have needed an inpatient rehabilitation stay,  consult was ordered; however, she was doing so well it was felt that she  could be maintained at home with a caregiver which she has arranged to stay  with her 24 hours a day for the next few days (a friend).  We will also  arrange for home health with [facility] with Mr. Albertine Patricia  coordinating those activities.   On the day of discharge, the patient was cheerful, had a minimal amount of  discomfort, p.o. meds were controlling her overall discomfort, wound was  dry, all staples were intact.  Neurovascularly intact to left lower  extremity and her calf was soft.  Negative Homan's.  She was afebrile.  She  had some mild anemia with her last blood count on March 27th with a  hemoglobin of 9.3, hematocrit was 27.6.  She had no dizziness.  She actually  was getting out of bed independently, using a rolling walker to go to the  bathroom and back to bed without assistance.   There will be some difficulty in her transportation.  Her daughter may be  able to take her home during the day; however, her daughter works until 6  p.m.  This is her primary mode of transportation and she will be discharged  when transportation available.  It might be noted that her surgery ended at  5:30 p.m. on the day of admission.   LABORATORY DATA:  Laboratory values in the hospital hematologically showed a  preoperative CBC of essentially within normal limits.  The RBC was slightly  high at 5.17.  The final hemoglobin was 9.3, hematocrit was 27.6.  Blood  chemistries remained stable.  Urinalysis negative for urinary tract  infection.   IMAGING STUDIES:   The preoperative chest x-ray showed mild cardiomegaly with  no active disease read by Ronaldo Miyamoto A. Maple Hudson, M.D.  Postoperative left knee films  showed left total knee arthroplasty without complication.  Electrocardiogram  showed normal sinus rhythm.   CONDITION ON DISCHARGE:  Improved, stable.   PLAN:  The patient is discharged to her home in the care of her family and  caregivers.  She is to have Home Health with [place].  Continue on Coumadin  protocol four weeks after the date of surgery.  Continue with home  medications and diet and we recommend that she contact her family physician.  Eduardo Osier. Sharyn Lull, M.D. is her cardiologist and Bing Neighbors. Clearance Coots, M.D. is  her gynecologist.   DISCHARGE MEDICATIONS FROM [place]:  1. Percocet 5/325 mg, dispense #50, 1-2 q.4-6 h. p.r.n. pain.  2. Robaxin 500 mg , dispense #30 with a refill, one q.6 h. p.r.n. muscle     spasm.  3. Vioxx 25 mg one daily, dispense #31 with no refill.  4. Restoril 15 mg, dispense #15, one h.s. p.r.n. sleep (I told her that we     would only give her about two weeks of this medication so that she will     not become dependent on it for sleep).  5. Coumadin per pharmacy.   FOLLOW UP:  Return to the center in approximately 2 weeks.   DISCHARGE INSTRUCTIONS:  Use dry dressing p.r.n., use ice p.r.n., may  continue weightbearing as tolerated with home physical therapy and  occupational therapy.  Call if any problems.     Dooley L. Cherlynn June.                 Almedia Balls. Ranell Patrick, M.D.    DLU/MEDQ  D:  03/24/2003  T:  03/24/2003  Job:  [id number]

## 2011-05-13 NOTE — Op Note (Signed)
[facility]  Patient:    [name], [name] Visit Number: [id number] MRN: [id number]          Service Type: SUR Location: 3W 0343 01 Attending Physician:  Delsa Bern Dictated by:   Lorne Skeens. Hoxworth, M.D. Proc. Date: 06/21/02 Admit Date:  06/21/2002                             Operative Report  PREOPERATIVE DIAGNOSIS:  Primary epigastric hernia.  POSTOPERATIVE DIAGNOSIS:  Primary epigastric hernia.  SURGICAL PROCEDURE:  Laparoscopic repair of ventral hernia.  SURGEON:  Lorne Skeens. Hoxworth, M.D.  ANESTHESIA:  General.  BRIEF HISTORY:  Ms. Levander Campion is a 70 year old female, over the past 5-6 years has had a gradually enlarging bulge in her upper abdomen.  She describes a knot which changes in size.  It gets quite large.  She has some occasional cramping abdominal pain associated with this that can become severe.  Physical exam has revealed a good-sized epigastric hernia, essentially diastasis recti but feeling more discrete, extending from just below the xiphoid to several centimeters above the umbilicus.  This is tender.  After discussion of options, we elected to proceed with repair.  The nature of the procedure, its indications, and risks of bleeding, infection, recurrence, and intestinal injury were discussed and understood.  She is now brought to the operating room for this procedure.  DESCRIPTION OF OPERATION:  The patient was brought to the operating room and placed in the supine position on the operating table, and general endotracheal anesthesia was induced.  She had been given preoperative broad-spectrum antibiotics.  The abdomen was sterilely prepped after placing of a Foley and orogastric tube.  Trocar sites were infiltrated with local anesthesia prior to the incisions.  A 1 cm incision was made in the left upper quadrant, dissection carried down to the anterior fascia which was elevated with a Kocher and the  abdomen entered with a Veress needle and pneumoperitoneum established easily.  The Optiview direct view trocar 12 mm was then placed through this site.  There was no bleeding.  The underlying omentum appeared normal.  Under direct vision, a 5 mm trocar was placed in the left flank and two 5 mm trocars in the right flank.  There appeared to be a fairly diffuse epigastric weakness as described above.  Using the harmonic scalpel, I took down the falciform ligament from the mid epigastrium up to the diaphragm to allow good seating of the mesh against the anterior abdominal wall.  I chose a piece of Bard dual layer mesh, measuring 22 x 17 cm, to widely cover the epigastrium from the xiphoid to just below the umbilicus.  Six sites on the anterior abdominal wall were marked for suture placement, corresponding to six 0 Prolene sutures placed circumferentially around the mesh.  The mesh was introduced into the abdomen through the initial trocar site and the trocar replaced, the mesh unfurled, and oriented.  Using the Endoclose device, each stitch was then brought up through the anterior abdominal wall, pulling the mesh out tautly in all directions, and the sutures were secured.  The mesh was then tacked around its periphery with the endo-tacker again, making sure the mesh was brought out tautly on all sides.  When this was accomplished with a nice placement of the mesh, the trocars were removed under direct vision.  The initial 12 mm trocar site was also closed with two interrupted 0  Vicryl sutures placed with the Endoclose.  All CO2 was evacuated from the peritoneal cavity.  Skin incisions were closed with interrupted subcuticular 4-0 Monocryl and Steri-Strips.  Sponge, needle, and instrument counts were correct.  Dry sterile dressings were applied and the patient taken to recovery in good condition. Dictated by:   Lorne Skeens. Hoxworth, M.D. Attending Physician:  Delsa Bern DD:   06/21/02 TD:  06/23/02 Job: [id number]

## 2011-05-13 NOTE — Discharge Summary (Signed)
NAME:  [name], [name]                   ACCOUNT NO.:  [id number]   MEDICAL RECORD NO.:  [id number]                   PATIENT TYPE:  INP   LOCATION:  0478                                 FACILITY:  [facility]   PHYSICIAN:  Almedia Balls. Ranell Patrick, M.D.              DATE OF BIRTH:  12-Mar-1941   DATE OF ADMISSION:  01/13/2004  DATE OF DISCHARGE:  01/17/2004                                 DISCHARGE SUMMARY   ADMISSION DIAGNOSES:  1. Left shoulder partial thickness rotator cuff tear.  2. Chronic impingement syndrome.  3. Hypertension.  4. Asthma.   DISCHARGE DIAGNOSES:  1. Left shoulder partial thickness rotator cuff tear.  2. Chronic impingement syndrome.  3. Hypertension.  4. Asthma.  5. Superior labia tear, anterior and posterior biceps tear.   PROCEDURES:  Left shoulder arthroscopy and biceps tendinotomy and open DCR.  Also a left knee arthrotomy and excision bone spurs laterally. The surgeon  was Almedia Balls. Ranell Patrick, M.D. The assistant was Standley Dakins, P.A. The  anesthesia was general and interscalene block on the left. Blood loss was  minimal. Hemovac to the left knee times one. The patient was given  perioperative antibiotics of Ancef.   BRIEF HISTORY:  The patient is a 70 year old female who presented to the  office with left shoulder and left knee complaints. The patient had lateral  gutter pain in the left knee following a total knee replacement that was  done on March 19, 2003.  The patient had x-rays which demonstrated  persistent bone spur present in the lateral aspect of the knee adjacent to  the lateral tibial tray. This was localized to where the patient was having  pain. The patient was otherwise doing fine regarding her knee replacement  with excellent motion and good point control; no effusions. She had no  fevers or chills. Her shoulders were bothering her as well. MRI scan of the  shoulders demonstrated a partial thickness rotator cuff tear versus full  thickness  cuff tear at the bleeding edge. The patient has a large  subacromial spur closing down the coracromial arch. The patient also had  advanced acromioclavicular joint arthritis. After failing conservative  management consisting of several injections, activity modification, and pain  medications, the patient desired surgical management to improve her quality  of life and decrease her pain.   LABORATORY DATA:  Initial chemistries postoperatively were all within normal  limits, except her glucose was elevated to 138. Otherwise, lab results were  negative.   HOSPITAL COURSE:  On January 03, 2004, patient was taken to the [facility]  operating room and underwent the procedures on her left shoulder and left  knee. She was than transferred to postanesthesia care unit and after  adequate time there and pain controlled, she was transferred up to 478-B.  The patient did well postoperatively. On postoperative day one, she was  complaining of about 8/10 pain. Physical therapy and occupational therapy  were begun.  On postoperative day two, she was complaining about 8/10 pain.  Her drain was pulled.  Physical therapy and occupational therapy stated that  she was improving and her range of motion was increasing, although her pain  still remained about the same.  Laboratory work was negative at this time.  Vital signs were stable. No fevers were noted. The wound site was healing  extremely well. No cellulitis or erythema. No numbness or tingling was  noted. Sensation was grossly intact. On postoperative day three, the patient  felt like she was doing a lot better. The pain was 3/10. Vital signs were  stable.  She was taking p.o. fluids and able to walk with assistance and a  knee immobilizer greater than 100 feet. Physical therapy and occupational  therapy released her at this point with all goals being met before stated  discharge. The patient had adequate range of motion of shoulder and of the  left  knee, and was under adequate pain control. The patient was discharged  on postoperative day four with no complications with home medications.  Again, on the last day, postoperative day four, wound site was healing well,  no cellulitis or erythema were noted, and vital signs were all stable with T-  max of 98.4.   DISCHARGE PLAN:  The patient will be discharged on January 17, 2004.   DISCHARGE DIAGNOSES:  Same as above.   DISCHARGE MEDICATIONS:  1. Percocet 5/325 mg as needed.  2. Robaxin 500 mg as needed.  3. She is to continue on home medications.   DIET:  Regular diet.   FOLLOWUP:  The patient is to call the office next week for a follow-up  appointment in seven to ten days.   ACTIVITY:  She is to be weightbearing as tolerated with an knee immobilizer  for her left knee and home health physical therapy.   DISPOSITION:  Home.   CONDITION ON DISCHARGE:  Improving.     [name] Nora, P.A.                     Almedia Balls. Ranell Patrick, M.D.    TBD/MEDQ  D:  01/20/2004  T:  01/20/2004  Job:  [id number]

## 2011-05-13 NOTE — Op Note (Signed)
NAMEASTIN, RAPE NO.:  [id number]   MEDICAL RECORD NO.:  [id number]          PATIENT TYPE:  AMB   LOCATION:  DFTL                         FACILITY:  [facility]   PHYSICIAN:  Madelynn Done, MD  DATE OF BIRTH:  03-28-1941   DATE OF PROCEDURE:  11/13/2006  DATE OF DISCHARGE:                               OPERATIVE REPORT   PREOPERATIVE DIAGNOSIS:  Left wrist de Quervain tenosynovitis.   POSTOPERATIVE DIAGNOSIS:  Left wrist de Quervain tenosynovitis.   ATTENDING SURGEON:  Madelynn Done, M.D., who was scrubbed and  present for the entire procedure.   ASSISTANT:  None.   PROCEDURE:  Left wrist first dorsal compartment release, tendon sheath  incision.   SURGICAL INDICATIONS:  Ms. Harless Litten is a 70 year old female with  persistent pain over the dorsal aspect of her left wrist.  Signs and  symptoms and objective findings consistent with de Quervain  tenosynovitis.  The patient has failed nonsurgical management to include  injection and splinting.  The patient elected to undergo the above  procedure.  The risks, benefits and alternatives were discussed in  detail with the patient, and signed informed consent was obtained.   INTRAOPERATIVE FINDINGS:  The patient did have separate multiple slips  of the APL and a separate subsheath to the EPB.   ANESTHESIA:  Local with IV sedation, MAC.   DESCRIPTION OF PROCEDURE:  The patient was properly identified in the  preoperative holding area and a mark with a permanent marker was made on  the left wrist to indicate the correct operative site.  The patient was  then brought back to the operating room, placed supine on the anesthesia  room table where IV sedation was administered.  The patient received  preoperative antibiotics, clindamycin secondary to a penicillin allergy.  Marcaine 0.25% and 1% lidocaine local block was then performed over the  first dorsal compartment.  She tolerated this procedure well, 10  mL  total.  A forearm tourniquet was then placed and the left upper  extremity was then prepped with Hibiclens and then sterilely draped.  A  several centimeter longitudinal incision was then made directly over the  first dorsal compartment.  Dissection was carried down through the skin  and the subcutaneous tissue.  The small subcutaneous veins were  cauterized using the bipolar.  The superficial radial nerve was  identified and protected throughout the entire case, just radial to the  incision.  The blunt dissection was carried down through to the  overlying first dorsal compartment where it was identified, and sharply  incised under direct vision.  The APL tendon was identified and then  just radially along the radial styloid the second compartment of the EPB  was identified.  These were both incised both proximally and distally  for a release of the first dorsal compartment.  The wound was then  thoroughly irrigated.  Small amounts of tenosynovium were then debrided  off the tendons, as well as a small thickening of the dorsal compartment  was debrided.  The wound was irrigated once again and then the skin was  then closed with  5-0 nylon horizontal mattress sutures.  A sterile  compressive dressing was then applied.  The tourniquet was deflated with  good perfusion of the digits.  The patient was then taken to the  recovery room in good condition.   POSTOPERATIVE PLAN:  The patient will follow up in the office in about  nine days.  She is to keep her bandage on until I see her back in the  office.  I will take the stitches out and then allow her to begin her  activity as tolerated.  Depending on how she does the first postop  visit, we may send her down to therapy.      Madelynn Done, MD  Electronically Signed     FWO/MEDQ  D:  11/13/2006  T:  11/14/2006  Job:  [id number]

## 2011-05-13 NOTE — Discharge Summary (Signed)
NAME:  [name], [name]                   ACCOUNT NO.:  [id number]   MEDICAL RECORD NO.:  [id number]                   PATIENT TYPE:  INP   LOCATION:  5007                                 FACILITY:  [facility]   PHYSICIAN:  Kerrin Champagne, M.D.                DATE OF BIRTH:  1941/06/24   DATE OF ADMISSION:  08/13/2004  DATE OF DISCHARGE:  08/25/2004                                 DISCHARGE SUMMARY   ADMISSION DIAGNOSES:  1.  L5-S1 painful degenerative spondylolisthesis with bilateral L4 and      bilateral L5 nerve root entrapment.  2.  History of asthma.  3.  History of myocardial infarction x2.  4.  Obesity.  5.  Hyperlipidemia.  6.  Anxiety and depression.  7.  History of angina.  8.  History of migraine headaches.  9.  History of cerebrovascular accident.  10. Status post open reduction and internal fixation of the right hip, 1989.   DISCHARGE DIAGNOSES:  1.  L5-S1 painful degenerative spondylolisthesis with bilateral L4 and      bilateral L5 nerve root entrapment as well as right L4-5 dural tear,      less than 3 mm in length, repaired with suture and fibrin glue.  2.  History of asthma.  3.  History of myocardial infarction x2.  4.  Obesity.  5.  Hyperlipidemia.  6.  Anxiety and depression.  7.  History of angina.  8.  History of migraine headaches.  9.  History of cerebrovascular accident.  10. Status post open reduction and internal fixation of the right hip, 1989.  11. Posthemorrhagic shock and anemia requiring transfusion during hospital      stay.  12. Ventilator-dependent respiratory failure.  13. Postoperative hypotension.  14. Elevated cardiac enzymes postoperatively, felt to be not related to      cardiac event, no ischemia on electrocardiogram.  15. Postoperative fever and mild leukocytosis, treated with Tequin x5 days.  16. Urinary tract infection with Escherichia coli 100,000 colonies, treated      with Tequin.  17. Hyponatremia, resolved.  18.  Deconditioning.  19. Constipation.   PROCEDURE:  On August 13, 2004, the patient underwent central decompressive  laminectomy at L4-5, right L4-5 facetectomy, repair of right L4-5 dural tear  using suture and fibrin glue technique.  Right iliac crest bone graft  harvested through a separate fascial incision.  Right L4-5 DePuy Leopard  Care T-LIF and posterolateral fusion, L4-5, utilizing combination of local  and Symphony bone graft and instrumentation of L4-5 using pedicle screws and  rod fixation.  Those procedures were performed by Kerrin Champagne, M.D.,  assisted by Wende Neighbors, P.A.-C., under general anesthesia.   CONSULTATIONS:  1.  Michael B. Sherene Sires, M.D., pulmonary medicine.  2.  Ellwood Dense, M.D., physical medicine and rehabilitation.   BRIEF HISTORY:  The patient is a 70 year old female who has been  experiencing discomfort in her back with radiation  into her right leg since  spring of this year.  She relates this to a fall that happened in the  spring.  She notes worsening with standing and ambulation with weakness in  her right lower extremity.  Studies have indicated spondylolisthesis at the  L4-5 level with nerve root entrapment of the L4 nerve roots.  Notable on x-  ray was a significant increase in shift at the L4 segment, reducing with  extension and lying down. The patient was felt to require surgical  intervention for definitive treatment.  She was admitted for the procedure  as stated above.   BRIEF HOSPITAL COURSE:  The patient tolerated the procedure under general  anesthetic.  Postoperatively she did develop hypotension and was unable to  be extubated.  It was noted that the patient did have a difficult  intubation.  Pulmonary consult was obtained to assist with ventilation-  dependent respiratory failure.  She was placed in the ICU and monitored on  ventilator for two days.  She was noted to have hypotension most likely due  to hemorrhagic shock.   Extensive blood loss occurred during the surgery.  The patient did receive two units of Cell Saver blood intraoperatively.  Her  total EBL was noted to be 1500.  She required another two units of packed  red blood cells postoperatively following notation of a hemoglobin of 8.6.  She returned with a value of 10.3.  The patient was noted to have elevated  cardiac enzymes postoperatively.  These were monitored throughout the  hospitalization and noted to be trending downward.  No obvious signs of  ischemia.  The patient placed on Protonix for GI prophylaxis.  DVT  prophylaxis was treated with PAS hose and mobilization as soon as patient  was stable.  The patient was able to be extubated on August 14, 2004.  Following extubation she required no sedation and was placed on oxygen.  The  patient was noted to have some upper airway wheezing and did have a history  of asthma.  She was treated with inhalers.  The patient did require NG tube  during the time in which she was intubated and in the ICU.  She developed  hypokalemia, which was repleted with oral supplementation.  Once the NG tube  was discontinued, she was able to begin a clear diet and advance slowly to  diet as tolerated.  Once she was stable from a medical standpoint, she was  transferred to the orthopedic unit, at which time she was started on  physical therapy for ambulation and gait training.  Her Hemovac drain was  discontinued on the second postoperative day.  Dressing changes were done  daily thereafter, and her wound was noted to be healing well.  The patient  complained of mild tenderness in the epigastric area but began tolerating a  regular diet soon after the discontinuation of her NG tube.  She did have an  episode of chest pain during the hospital stay, which was quickly relieved  with nitroglycerin.  Once physical therapy was initiated, the patient was very slow with her progress.  A brace was applied at bedside seated.   She  was found a couple of times to be ambulating without her brace and was  instructed in strict use of the brace when out of bed.  She slowly was able  to ambulate in the hallway with the physical therapist.  A walker was  utilized.  The patient received a rehab consult,  initially was thought to be  possible suitable candidate for inpatient rehabilitation; however, she began  to progress quite well with her physical therapy and was ambulating as much  as 120 feet with close supervision and a rolling walker.  She was also  tolerating her brace better.  She also received occupational therapy for  ADLs.  The patient was able to do much of her ADLs independently.  The  patient had some difficulty with pain control.  Initially IV PCA morphine  was utilized in addition to OxyContin and OxyIR.  Her OxyContin was  increased and her pain was better controlled once the IV morphine was  discontinued.  Muscle relaxers were also used.  The patient was felt to be  stable for discharge to her home on August 23, 2004. Unfortunately, a social  situation arose in which the patient was advised by her roommate that she  would not be allowed to return to her home.  The patient had no other  relatives or place to reside and certainly was not independent enough or  well enough to be out seeking a suitable home or moving.  At that time she  was seen by the discharge planning nurse for nursing home placement.  The  patient was progressing fairly well with physical therapy.  She was  tolerating p.o. analgesics.  The patient was tolerating a regular diet.  The  patient's hyponatremia was resolved, and she was instructed in increasing  her p.o. intake.  She was saturating 100% on room air.  Hemoglobin and  hematocrit at discharge were 10.6 and 32.7.  Fortunately, a bed was  available at [facility] on August 25, 2004, and she was stable for  transfer there for continued care.   Pertinent laboratory values:   The patient required blood transfusion through  the hospital stay.  She dropped to the lowest value during the hospital stay  of 8.4.  At discharge, hemoglobin and hematocrit were 10.6 and 32.7,  respectively.  The patient had hyponatremia and hypokalemia through much of  the hospital stay.  At discharge sodium was 137 with potassium 4.2.  Cardiac  enzymes were elevated postoperatively but trending downward during the  hospital stay.  On August 15, 2004, CK 823, CK-MB 4.0, and relative index  0.5, as well as troponin I  0.01 with no indication of myocardial injury.  The patient did have a urinary tract infection postoperatively.  Cultures  showed E. coli UTI, treated with Tequin.  Chest x-ray on August 15, 2004,  showed mild residual right basilar atelectasis post extubation with no  evidence of acute disease otherwise.  EKG on August 16 showed normal sinus rhythm, cannot rule out old anterior infarct, no significant change since  the last tracing.  Repeat on August 24 showed normal sinus rhythm,  nonspecific T-wave abnormality.   PLAN:  The patient is being discharged to [facility].  There she  should continue to receive physical therapy on a daily basis.  The patient  should have ambulation training.  She does have an Aspen LSO brace, and this  should be applied while seated at the bedside.  She should have the brace on  at all times when she is out of bed, including when she is in the seated  position in a chair.  She is only allowed the brace off when she is sleeping  at night in her bed.  The patient should also undergo strengthening  exercises for the lower extremities.  She may also have strengthening  exercises for the upper extremities.  No range of motion of the back.  No  lifting over five pounds.  No bending, squatting, or stooping.  She should  receive occupational therapy for ADLs until she is independent with such.  The patient will continue on a regular diet.  She is  encouraged to have good  p.o. intake.  Dressing change may be done daily on the back.  She does have  Steri-Strips, and these should be allowed to remove spontaneously.  The  patient has been dealing with constipation at the latter part of her  hospital stay, and Dulcolax p.o. and suppository has been tried.  Should she  continue with constipation, she should also receive an enema.  She should be  on a stool softener daily.   Other medications at discharge include:  1.  Protonix 40 mg p.o. daily.  2.  Trinsicon one p.o. daily.  3.  OxyContin 10 mg three tablets p.o. q.12h.  4.  Potassium chloride 20 mEq p.o. b.i.d.  5.  OxyIR one to two every four to six hours as needed for pain.  6.  Senokot one p.o. b.i.d.  7.  Albuterol inhaler q.6h.  8.  Robaxin 500 mg one every eight hours as needed for pain.  9.  Nitroglycerin 0.4 mg sublingual every five minutes x3 for chest pain.   The patient should follow up with Dr. Otelia Sergeant in the office in approximately  two weeks.  Arrangements will need to be made for the patient to be  transferred via ambulance, and the appointment may be made by calling [phone].  The patient should be allowed to shower at the nursing facility with  no coverage of her sound.  The Steri-Strips may be removed two weeks from  the date of surgery if there is no further drainage, and dressing changes  may be stopped at that time.  If there are questions or concerns regarding  the patient's orthopedic status, please notify Dr. Barbaraann Faster office.  The  patient's primary care physician is usually Dr. Alwyn Ren.  Her cardiologist is  Dr. Sharyn Lull.  Should  medical problems arise, she may be seen by these physicians; otherwise, she  will be cared for by the staff physician at the nursing facility.   CONDITION ON DISCHARGE:  Stable.      Wende Neighbors, P.A.                    Kerrin Champagne, M.D.   SMV/MEDQ  D:  08/25/2004  T:  08/25/2004  Job:  [id number]

## 2011-05-13 NOTE — Op Note (Signed)
[facility]  Patient:    [name], [name] Visit Number: [id number] MRN: [id number]          Service Type: SUR Location: 4W [id number] [id number] Attending Physician:  Verlee Rossetti. Dictated by:   [name], M.D. Proc. Date: 02/28/02 Admit Date:  02/28/2002                             Operative Report  PREOPERATIVE DIAGNOSIS:  Right shoulder rotator cuff tear.  POSTOPERATIVE DIAGNOSES:  1. Right shoulder rotator cuff tear.  2. Right shoulder superior labrum anterior posterior tear.  3. Right shoulder subscapularis tear.  PROCEDURE:  Right shoulder arthroscopy with debridement of flap lesion followed by open acromioplasty, rotator cuff repair and subscapularis repair.  ATTENDING SURGEON:  [name], M.D.  ASSISTANTJill Side P. Mahar, P.A.  ANESTHESIA:  General.  ESTIMATED BLOOD LOSS:  Minimal.  FLUID REPLACEMENT:   1800 cc.  INSTRUMENT COUNT:  Correct.  COMPLICATIONS:  None.  Perioperative antibiotics were given.  INDICATIONS FOR PROCEDURE:  The patient is a 70 year old female who presents with plenty of significant right shoulder pain. She sustained an injury to her shoulder in November 2002. She complained of significant pain in her right shoulder preventing her from sleeping on that side and also preventing her from participating in overhead activities. The patient has failed trials of conservative management to include activity modifications, physical therapy, and subacromial injections. She continues to have persistent impingement type symptoms. Her findings on physical examination included significant weakness to internal and external rotation with her arm at her side and positive impingement test.  DESCRIPTION OF PROCEDURE:   After an adequate level of general anesthesia was achieved and 1 gm of Ancef was given preoperatively, the patient was positioned in the modified beach chair position. All of her neurovascular structures  were padded appropriately. Examination under anesthesia was performed and this revealed forward flexion to 120 degrees, abduction to 100 degrees, external rotation to 50 degrees and internal rotation to 60 degrees. Negative sulcus, negative anterior posterior drawer. After completion of the UA, the right shoulder was prepped and draped in its entirety in the usual sterile fashion. Diagnostic arthroscopy was performed through standard posterior and anterior arthroscopic portals. These were created in a similar fashion with infiltration of skin with 0.5% Marcaine with epinephrine followed by incision with an 11 blade scalpel in the direction of the cannula in the joint using blunt obturator. Diagnostic arthroscopy revealed fraying and tearing of the superior labrum at its junction with the glenoid. There was on instability of the biceps anchor. There was normal anterior inferior labrum, the subscapularis was notably torn. There were no intact fibers of the subscapularis visualized in addition there was a large supraspinatus rotator cuff tear which may have been involving some of the infraspinatus insertion. The teres minor was normal. Glenohumeral articular cartilage was normal. The pouch was normal in size with no loose bodies. The posterior labrum was intact. A 4-5 full radius resector was used to debride the superior labral tear back to stable labral tissue. Again there was no evidence of instability of the biceps anchor and the biceps tendon appeared normal in its entirety. After this, the scope was removed. A saber incision was created just lateral to the [facility] joint extending anteriorly in laying her skin lines. This was done using a 10 blade scalpel. The dissection was carried sharply down to the subcutaneous tissue. The deltotrapezial fascia was  identified. This was divided in line with the distal clavicle and extending over the acromion and then down the raphe between the anterior and  middle portions of the deltoid. Dissection was carried sharply down through this tissue utilizing a needle-tip Bovie such that full-thickness flaps were elevated and subperiosteal dissection was performed in the distal clavicle and the acromion. A completely ossified CA ligament was identified and this was excised utilizing a rongeur and Cobb elevator. One centimeter of distal clavicle was removed. The disk joint was noted to be arthritic on preoperative films with significant inferior osteophytes, osteolysis and joint space narrowing. An anterior acromioplasty was then performed followed by a standard anterior to posterior acromioplasty to flatten the acromion creating a type 1 acromion and decompressing the subacromial space. The biceps was noted to be in its groove and in good shape, however, the ______ and subscapularis tendons were completely torn. These tendons were identified and freed up from the surrounding tissue and mobilized and then repaired back to their anatomic footprints utilizing a combination of 5-0 cork screw anchors and bone tunnels created with an awl. Two such anchors were utilized for the rotator cuff repair (supraspinatus). A single anchor was utilized for the superior portion of the subscapularis. There was noted to be approximately a third of the ______. Suture anchors were brought up through the rotator cuff tissue and tied in a mattress fashion. A single Mason-Allen suture was placed in the leading edge of the supraspinatus tendon and brought out through bone tunnels which were created using an awl and the supraspinatus was sutured to the subscapularis over the top of the biceps closing the rotator interval and providing additional strength to the repair site. An anatomic repair of the rotator cuff was thus performed. The shoulder was taken through a full range of motion, no impingement was noted. The deltotrapezial interval was closed utilizing #0 Ethibond  pop-off sutures in a figure-of-eight fashion. After thorough irrigation, 2-0 silk closure was performed followed by 4-0 running  monocryl which was also used to close the posterior portal. Sterile dressings applied followed by a shoulder immobilizer. The patient was awakened and taken to the PACU in stable condition having tolerated the surgery well. Dictated by:   [name], M.D. Attending Physician:  [name] R. DD:  03/04/02 TD:  03/04/02 Job: [id number]

## 2011-05-13 NOTE — Discharge Summary (Signed)
[facility]  Patient:    [name], [name] Visit Number: [id number] MRN: [id number]          Service Type: SUR Location: 3W [id number] [id number] Attending Physician:  Delsa Bern Dictated by:   Lorne Skeens. Hoxworth, M.D. Admit Date:  06/21/2002 Disc. Date: 06/25/02                             Discharge Summary  DISCHARGE DIAGNOSIS: Primary ventral abdominal hernia.  SURGICAL PROCEDURE: Laparoscopic repair of ventral hernia on June 21, 2002.  HISTORY OF PRESENT ILLNESS: The patient is a 70 year old black female, who over the past 5 or 6 years has had gradually increasing bulge and discomfort in her epigastrium. She describes a knot which changes in size and occasionally get quite large. This is associated with some crampy abdominal pain that is occasionally severe. No nausea, vomiting or other GI systems. Bowel movements are regular. She has not had any previous abdominal surgery.  PAST MEDICAL HISTORY: She is followed by Dr. Clearance Coots and Dr. Sharyn Lull. She is treated for hypertension and mild coronary artery disease. Also followed for anxiety/depression, osteoarthritis, mild asthma, and GERD.  MEDICATIONS:  1. Atenolol 25 mg a day.  2. Vioxx 25 mg a day.  3. Nexium 40 mg daily.  4. ______ XL 30 mg a day.  5. Paxil 20 mg daily.  6. Skelaxin 400 mg a day.  7. Nitrol tab 0.4 sublingual p.r.n.  8. Temazepam 30 mg daily.  9. Isosorbide mononitrates 60 mg a day. 10. Sucralfate 1 g a.c. and h.s. 11. Lasix 40 mg a day. 12. Albuterol inhaler p.r.n.  ALLERGIES: She is allergic to PENICILLIN.  For social history, family history, and review of systems, see detailed history and physical.  PERTINENT PHYSICAL EXAMINATION:  VITAL SIGNS: She is 5 feet 4 inches, 232 pounds, afebrile. Vital signs all within normal limits.  ABDOMEN: Examination was unremarkable except for the abdomen which revealed a good sized hernia in the epigastrium extending from  below the xiphoid to several centimeters below the umbilicus. This is essentially a diastasis recti but more discrete and definitely tender on examination.  ADMISSION LABORATORY DATA: Potassium was low at 3.2, electrolytes otherwise normal. CBC normal.  HOSPITAL COURSE: The patient was admitted on the morning of her procedure and underwent laparoscopic repair with a large piece of mesh placed in the upper abdomen. Postoperatively, she had expected incisional pain but otherwise did well. She was started on a clear liquid diet immediately postoperatively and was able to be advanced to a regular diet without difficulty and tolerated oral medications. Pain control gradually improved over 24-72 hours. She was felt ready for discharge on June 24, 2002, but placement was an issue as she had recently lost her apartment and had essentially no where to go. Therefore, social work became involved to find assisted living situation for her for discharge, and she is felt ready for discharge to this care on June 25, 2002.  DISCHARGE MEDICATIONS:  1. Atenolol 25 mg a day.  2. Vioxx 25 mg a day.  3. Nexium 40 mg daily.  4. ______ XL 30 mg a day.  5. Paxil 20 mg daily.  6. Skelaxin 400 mg a day.  7. Nitrol tab 0.4 sublingual p.r.n.  8. Temazepam 30 mg daily.  9. Isosorbide mononitrates 60 mg a day. 10. Sucralfate 1 g a.c. and h.s. 11. Lasix 40 mg a day. 12. Albuterol inhaler p.r.n.  13. Tylox as needed for pain.  The abdomen is benign. The wound is healing well.  ACTIVITY: To include no heavy lifting or strenuous activity. She may bathe or shower.  FOLLOWUP: To be in my office in approximately two weeks. Dictated by:   Lorne Skeens. Hoxworth, M.D. Attending Physician:  Delsa Bern DD:  06/25/02 TD:  06/25/02 Job: [id number]

## 2011-05-13 NOTE — Op Note (Signed)
NAME:  [name], [name]                   ACCOUNT NO.:  [id number]   MEDICAL RECORD NO.:  [id number]                   PATIENT TYPE:  INP   LOCATION:  0457                                 FACILITY:  [facility]   PHYSICIAN:  Almedia Balls. Ranell Patrick, M.D.              DATE OF BIRTH:  04-Sep-1941   DATE OF PROCEDURE:  03/19/2003  DATE OF DISCHARGE:                                 OPERATIVE REPORT   PREOPERATIVE DIAGNOSIS:  Left knee osteoarthritis, severe.   POSTOPERATIVE DIAGNOSIS:  Left knee osteoarthritis, severe.   PROCEDURE:  Left total knee replacement.   SURGEON:  Almedia Balls. Ranell Patrick, M.D.   FIRST ASSISTANT:  Ollen Gross, M.D.   SECOND ASSISTANT:  Clarene Reamer, P.A.-C.   ANESTHESIA:  Spinal anesthesia plus MAC.   ESTIMATED BLOOD LOSS:  500 mL.   FLUID REPLACEMENT:  2000 mL LR.   INSTRUMENT COUNT:  Correct.   COMPLICATIONS:  None.   URINE OUTPUT:  250 mL.   INDICATIONS:  The patient is a 70 year old female who presents with  increasing left knee pain.  The patient has failed conservative management  consisting of activity modification, corticosteroid injections, and pain  medications.  The patient presents with chronic pain to include pain with  activity, weightbearing, as well as pain at night.  The patient has advanced  osteoarthritis in the lateral compartment as well as the patellofemoral  compartment and has a valgus deformity to her knee.  After discussing  options with the patient, she would like to proceed with knee replacement  surgery.  Informed consent was obtained.   DESCRIPTION OF PROCEDURE:  After an adequate level of anesthesia was  achieved, the patient was positioned supine on the operating table.  A  nonsterile tourniquet was placed on the left proximal leg.  The left leg was  then prepped and draped in its entirety in the usual sterile fashion.   After exsanguination of the limb and elevation of the tourniquet to 350  mmHg, a longitudinal skin  incision was created overlying the knee.  This was  done using a 10 blade scalpel.  Dissection was carried sharply down through  the subcutaneous tissues using Bovie electrocautery.  The patellar  retinaculum was identified, and a medial parapatellar arthrotomy was created  utilizing a knife.  The knee was then flexed and the patella everted.  Patellofemoral ligaments were released to allow easy patellar eversion.  Some of the fat pad was removed as well.  The cruciate ligaments were  excised as well as menisci.   At this point, a step-cut drill was used to cannulate the distal femur for  introduction of an intramedullary distal femoral guide.  A five degree left  setting was selected with 10 mm of resection of the distal femur, and this  was done with an oscillating saw.  Next, the distal femur was sized to a  size 3 and marked, referencing off the epicondylar axis.  Next,  the distal  four-in-one cutting guide was applied to the distal femur and utilized to  resect anterior and posterior bone.  Chamfer cuts were left for later.  Posterior osteophytes were then removed as well as the posterior aspect of  the meniscus.  A trochlear elevator was placed behind the tibia and used to  sublux the tibia forward in order to gain excellent access to the proximal  tibia.   At this point, an external alignment jig was used on the tibia to resect 4  mm off the involved side.  Once this was done, trial reduction was performed  which was noted to be too tight.  Thus, an additional 4 mm of resection was  performed, doing the external alignment guide as well.  Again, a trial  reduction was performed.  Excellent soft tissue balancing and alignment were  noted as well as appropriate tension.  At this point, the remainder of the  tibia was prepared including the recessed portion for the keel of the tibial  prosthesis.  Alignment was marked with the trial in place.  The posterior  cruciate substituting box  cut was then made followed by chamfer cuts,  utilizing the guide for the distal femur.  The patella was resurfaced.  This  measured 25 mm in thickness, and 10 mm were resected to be replaced by 10 mm  prosthesis, and this was sized to a size 38 mm oval dome __________ patella.   At this point, trial components were removed.  Cement was mixed on the back  table, and then the real components which were size 3 DePuy PFC Sigma  rotating platform components, both size 3 on the femur, size 3 on the tibia,  were cemented into place with care taken towards removing excess cement.  In  addition, the 38 mm patellar button was cemented into place as well.  Once  the cement was allowed to harden, a trial reduction was performed.  The 12.5  mm tibial poly trial was noted to have the best soft tissue balance and  tension.  We were able to get full extension and full flexion and hands-free  patellar retracting without any subluxation.  No lateral release was  performed.  The trial poly was removed, and the 12.5 real poly was inserted  with the posterior cruciate substituting post.  The knee was reduced and  taken through a full range of motion.  Outstanding soft tissue balance was  obtained.   At this point, a drain was placed in the knee joint, and the medial  parapatellar arthrotomy was closed using #1 PDS suture in an interrupted  figure-of-eight fashion, followed by 2-0 Vicryl for the subcu, and the skin  with staples.  Sterile dressing was applied followed by a knee immobilizer.   The patient was taken to the recovery room in stable condition.                                               Almedia Balls. Ranell Patrick, M.D.    SRN/MEDQ  D:  03/20/2003  T:  03/20/2003  Job:  [id number]

## 2011-05-13 NOTE — Op Note (Signed)
NAME:  [name], [name]                   ACCOUNT NO.:  [id number]   MEDICAL RECORD NO.:  [id number]                   PATIENT TYPE:  INP   LOCATION:  0484                                 FACILITY:  [facility]   PHYSICIAN:  Almedia Balls. Ranell Patrick, M.D.              DATE OF BIRTH:  [birth year]   DATE OF PROCEDURE:  01/13/2004  DATE OF DISCHARGE:                                 OPERATIVE REPORT   PREOPERATIVE DIAGNOSES:  Left shoulder partial thickness rotator cuff tear,  chronic impingement syndrome, subacromial spur formation and  acromioclavicular joint arthritis. Left knee pain following total knee  replacement with bone spur.   POSTOPERATIVE DIAGNOSES:  Left shoulder partial thickness rotator cuff tear,  chronic impingement syndrome, subacromial spur formation and  acromioclavicular joint arthritis. Left knee pain following total knee  replacement with bone spur.  Superior labral tear anterior posterior and  biceps tear.   PROCEDURE:  Left shoulder arthroscopy with extensive arthroscopic  debridement followed by arthroscopic biceps tenotomy and debridement of  superior labral tear anterior posterior, arthroscopic subacromial  decompression, bursectomy followed by open distal clavicle excision.  Additionally left knee arthrotomy performed with excision of bone spurs in  the lateral gutter.   SURGEON:  Almedia Balls. Ranell Patrick, M.D.   ASSISTANT:  Alphonsa Overall, P.A.-C.   ANESTHESIA:  General plus interscalene block anesthesia used.   ESTIMATED BLOOD LOSS:  Minimal.   FLUIDS REPLACED:  2100 mL crystalloid.   COMPLICATIONS:  None.   ANTIBIOTICS:  Perioperative antibiotics were given.   INDICATIONS FOR PROCEDURE:  The patient is a 70 year old female who presents  for left shoulder and left knee complaints. The patient had lateral gutter  pain in the left knee following total knee replacement that was done on  March 19, 2003. The patient has x-rays demonstrating a persistent bone spur  present in the lateral aspect of the knee adjacent to the lateral tibial  tray. This was localized to the patient's pain.  The patient otherwise was  doing fine regarding her knee replacement, with excellent motion and good  point control, no effusions. She had no fevers or chills. Her shoulders were  bothering her as well.  MRI scan demonstrates partial thickness rotator cuff  tear versus full thickness cuff tear bleeding edge. The patient has a large  subacromial spur formed closing down the coracoacromial arch. The patient  also has advanced AC joint arthritis.  After failing conservative management  consisting of serial injections, activity modification and pain medications,  the patient desires surgical management to improve her quality of life and  decrease her pain.   DESCRIPTION OF PROCEDURE:  Initial operation would be a knee surgery.  The  nonsterile tourniquet was placed on the left proximal thigh, the left leg  was sterilely prepped and draped.  After exsanguination of the limb using an  Esmarch bandage and elevating the tourniquet to 350 mmHg, the bottom portion  of the patient's midline incision was  opened up using a 10 blade scalpel,  dissection was carried sharply down to the patellar tendon and patellar  retinaculum, a lateral patellar arthrotomy was performed revealing  persistent bone left adjacent to the lateral tibial tray and poly.  This was  removed subperiosteally down to the level of the base plate and 90 degree  tibial cut. This was done all the way down to the posterolateral corner, x-  rays were done in the operating room to assure appropriate bone removal.  Once this was completed, the Hemovac drain was placed in the joint, lateral  parapatellar arthrotomy was closed using interrupted #1 PDS suture followed  by 2-0 Vicryl subcutaneous and staples for the skin. A sterile dressing was  applied, tourniquet was deflated. Attention was directed towards the   shoulder, the patient was positioned into the modified beach chair position,  all neurovascular structures were padded appropriately.  The patient was  secured appropriately to the table.  The patient's shoulder was examined  under anesthesia and there was noted to be no undue stiffness with forward  elevation of 180 degrees, abduction 120, external rotation 60, internal  rotation well behind her back, no evidence of adhesive capsulitis. After  this, the shoulder was sterilely prepped and draped in the usual sterile  fashion. Diagnostic arthroscopy was performed through standard arthroscopic  portals.  An incision was created in a similar fashion with infiltration of  the skin with 0.25% Marcaine with epinephrine followed by incision  ___________ cannula. There was noted to be extensive tearing in the superior  labrum and biceps anchor with partial detachment of biceps and damage to the  biceps tendon. It was decided that this biceps was the cause of this  patient's shoulder pain. Based on its extensive degeneration, it was decided  to proceeded tenotomy.  This was done arthroscopically with the ArthroCare  unit. The remainder of the torn superior labrum was debrided, anterior  inferior labrum, posterior labrum were normal. The rotator cuff was noted to  be completely torn in the region of the supraspinatus and this was well  retracted and not repairable.  The free edges were debrided back, the scope  was placed in the subacromial space and a thorough bursectomy was performed  followed by acromioplasty and removed a large anterior hook to the acromion  which was converging on the bleeding edge of the rotator cuff.  The  patient's subscapularis had been noted to be normal.  At this point  following removal of the patient's acromial spur, final inspection of the  joint was performed. Again the cuff tear was noted anteriorly at the region of the supraspinatus tendon, infraspinatus and teres  normal and tendons  appeared normal.  Thus the left shoulder was felt to be well balanced with a  nice subscapularis and good posterior cuff.  Now she has a nice smooth  acromial arch.  The CA ligament was recessed but not resected. At this  point, a small sabre incision was created overlying the distal clavicle at  the [facility] joint.  Subcutaneous dissection was performed using Bovie  electrocautery.  Subperiosteal dissection in the distal clavicle was  performed and the distal 1 cm was removed using the oscillating saw.  The [facility]  interval was inspected, the degenerative disk was removed. The patient's  cartilage was noted to be completely arthritic.  At this point, the wound  was clearly irrigated, bone wax applied to the distal end of the clavicle  and the deltotrapezial fascia  repaired using interrupted #0 Ethibond suture  with buried knots.  2-0 Vicryl was used to close the subcutaneous layer and  4-0 running Monocryl for the skin and the portals. A sterile dressing was  applied followed by a shoulder splint. The patient was taken to the recovery  room in stable condition. Therapy will start tomorrow.                                               Almedia Balls. Ranell Patrick, M.D.    SRN/MEDQ  D:  01/13/2004  T:  01/13/2004  Job:  [id number]

## 2011-05-13 NOTE — H&P (Signed)
NAME:  [name], [name]                   ACCOUNT NO.:  [id number]   MEDICAL RECORD NO.:  [id number]                   PATIENT TYPE:  INP   LOCATION:  NA                                   [facility]ACILITY:  [facility]   PHYSICIAN:  Almedia Balls. Ranell Patrick, M.D.              DATE OF BIRTH:  April 22, 1941   DATE OF ADMISSION:  DATE OF DISCHARGE:                                HISTORY & PHYSICAL   HISTORY:  Ms. [name] is a 70 year old African-American female who comes to the  [facility] complaining about continued left shoulder pain and  left knee pain laterally.  This has been going on for sometime.  The patient  has failed conservative management, and we discussed surgical approach to  this.  Radiographs have been obtained and shown that she has a left rotator  cuff and also, continued left lateral knee pain that was not alleviated by  any physical therapy.   PAST MEDICAL HISTORY:  Ms. [name] has a history of hypertension,  coronary artery disease, hypercholesterolemia, hyperlipidemia, morbid  obesity, CVA.   PRIMARY CARE PHYSICIAN:  [name], M.D.  He performed a medical  clearance on her on December 01, 2003, which showed a normal Cardiolite with  no evidence of ischemia or infarct with ejection fraction of 62% at that  time.   CURRENT MEDICATIONS:  1. Atenolol 25 mg 1 tab daily.  2. Imdur 60 mg 1 tab daily.  3. Enteric-coated aspirin, 1 tab daily.  There are several other medications which the patient brought with her to  exam, are in the office notes also.  This list will be provided.   SOCIAL HISTORY:  Negative.   FAMILY HISTORY:  History of high blood pressure and coronary artery disease  in her family.   PHYSICAL EXAMINATION:  VITAL SIGNS:  Temperature not obtained, respirations  18 per minute, blood pressure 132/92, pulse 78 and regular.  GENERAL:  Healthy, obese African-American female with pleasant affect and  mood today.  Glasgow coma scale 15.  HEENT:  Eyes  normal to inspection, PERRL, no lymphadenopathy, pharynx  normal.  LUNGS:  Breath sounds were normal in bilateral lungs.  CHEST:  Nontender.  CARDIOVASCULAR:  Regular rate and rhythm. No murmur, gallop, S3 or S4, no  friction rub.  ABDOMEN:  Nontender in all quadrants, bowel sounds active in all quadrants.  No tenderness, guarding or rebound. No hepatosplenomegaly or splenomegaly  noted.  SKIN:  No obvious rash, color is good, no diaphoresis or pallor, warm and  dry.  EXTREMITIES:  Patient does have mild pedal edema bilaterally that is not  pitting.  Capillary refill is less than 2 seconds. Sensation is equal  bilaterally, both in the hands and the feet. Reflexes were 2+ and brisk,  both at the Achilles and at the brachioradialis. Full range of motion  without any difficulty.  Patient does have some decreased range of motion in  her left shoulder and also  some mild crepitus. Also, she has left lateral  knee pain with palpation, but does have full range of motion with mild  crepitus noted on exam.  Left knee is stable x4 with ligament.  McMurray's  was negative with good range of motion preoperatively.  NEUROLOGIC:  She has a pleasant affect, no weakness or sensory loss noted to  any extremity, right compared to left, both upper and lower extremities.   LABORATORY DATA:  Preoperative labs obtained:  CMET with only pertinent  positive of sodium 149, glucose 130, otherwise negative.  Urinalysis  essentially negative.  Complete blood count with differential showed mildly  elevated hemoglobin and hematocrit of 15.2 and 46.1 respectively.  Otherwise, preoperative labs were negative.   DIAGNOSTIC IMPRESSION:  Surgical approach and management of her torn left  rotator cuff, which will be followed by an open subacromial decompression  with distal clavicle resection with rotator cuff repair.  Also, there will  be a small lateral arthrotomy of her left knee to explore the lateral joint,  seen  on the anteroposterior radiographs in the office, which may be causing  her some impingement in her lateral gutter.  We will be doing both of these  procedures during the same sitting.      [name] Nora, P.A.                     Almedia Balls. Ranell Patrick, M.D.    TBD/MEDQ  D:  01/07/2004  T:  01/07/2004  Job:  [id number]

## 2011-05-13 NOTE — Op Note (Signed)
[name], [name]                   ACCOUNT NO.:  [id number]   MEDICAL RECORD NO.:  [id number]                   PATIENT TYPE:  INP   LOCATION:  2112                                 FACILITY:  [facility]   PHYSICIAN:  Kerrin Champagne, M.D.                DATE OF BIRTH:  Oct 02, 1941   DATE OF PROCEDURE:  08/13/2004  DATE OF DISCHARGE:                                 OPERATIVE REPORT   PREOPERATIVE DIAGNOSIS:  L5-S1 painful degenerative spondylolisthesis with  bilateral L4 and bilateral L5 nerve root entrapment.   POSTOPERATIVE DIAGNOSIS:  1. L5-S1 painful degenerative spondylolisthesis with bilateral L4 and     bilateral L5 nerve root entrapment.  2. Right L4-5 dural tear less than 3 mm in length.  Repaired with three     Nurolon sutures and Tisseel fibrin glue.   OPERATION/PROCEDURE:  1. Central decompressive laminectomy at L4-5.  2. Right L4-5 facetectomy.  3. Repair of right L4-5 dural tear using suture and fibrin glue technique.  4. Right iliac crest bone graft harvested through a separate fascial     incision.  5. Right L4-5 Depuy leopard cage T lift and posterolateral fusion L4-5     utilizing combination of local and Symphony bone graft material,     instrumentation of L4-5 using pedicle screws and rods fixation.   SURGEON:  Kerrin Champagne, M.D.   ASSISTANT:  Wende Neighbors, P.A.-C.   ANESTHESIA:  General orotracheal, Dr. Gypsy Balsam.   ESTIMATED BLOOD LOSS:  1500 mL.   DRAINS:  1. Foley to straight drain.  2. Hemovac x1.   FLUIDS REPLACED:  The patient received two units of Cell Saver blood.  She  has additionally autogenous blood available if necessary.   HISTORY OF PRESENT ILLNESS:  This patient is a 70 year old female who has  been experiencing discomfort and pain in her back with radiation into her  right leg.  This has been present since a fall in the spring of this year.  She was seen and evaluated.  The pain is worsening with standing and  ambulation  with weakness into her right lower extremity.  Studies have  indicated a spondylolisthesis at the L4-5 level with a nerve root entrapment  of the L4 nerve roots.  With flexion there is significant increase in shift  at the L4-5 segment reducing with extension and lying down.  The patient is  brought to the operating room to undergo decompressive laminectomy at L4-5  and 360-degree fusion.   INTRAOPERATIVE FINDINGS:  The patient was found to have a grade 1  spondylolisthesis alignment, sagittally was restored using a T lift  anteriorly measuring 10 mm with posterolateral fusion using pedicle screw  and rod instrumentation, obtaining compression over the construct.   DESCRIPTION OF PROCEDURE:  After adequate general anesthesia, the patient  had pedicle screw monitor system applied to the lower extremities with some  difficulty.  The Foley catheter was placed.  Standard preoperative  antibiotics.  The patient was rolled to a prone position with chest rolls on  pressure points well padded, TED hose to both lower extremities.  The  [facility] fracture table was used for radiolucent effect and the ability to  use C-arms.  The patient was well padded over all pressure areas.  Standard  prep with DuraPrep solution in the lower dorsal spine in the S2-3 level,  draped in the usual manner.  Vi-Drape was used.   Incision in the midline at expected S1 level carried cranially approximately  10-12 cm.  Skin and subcutaneous layers down to the lumbodorsal fascia.  The  area of subcutaneous fat nearly four to five inches in depth.  Self-  retaining retractors used.  Large cerebellar for exposure.  Incision made  over both sides of the spinous process at the expected L4 level.  This was  found to be L5 on the lateral radiograph.  Several radiographs were  necessary to identify the segment and eventually, even with the radiographs,  exposure was made distally, caudally to the L5-S1 level identifying the   sacrum and its segment first.  The L5 spinous process found to be the marked  level.  An additional clamp was placed on the L4 spinous process  posteriorly.  Cobb used to elevate the paralumbar muscles bilaterally.  Exposure then carried out laterally over the lateral aspect of the facet at  the L3-4 level, identifying the L4 transverse process bilaterally, preserved  the L3-4 facet capsule.  Additionally at the L4-5 level, the facet capsule  was resected and exposure obtained out to the tips of the transverse  processes bilaterally.  Some paralumbar muscle was debrided for exposure and  visualization of the transverse processes and posterior eminence to allow  for pedicle screw insertion.  The Leksell rongeur was used to remove the  spinous process of L4 centrally.  Then a central portion of the lamina of L4  carried this superiorly.  About 50% of the spinous process of L5 and the  lower 50% of L3 was resected in addition to that bone over the posterior  aspect of L4.  This was morselized to be used for later bone graft using  Symphony allograft material mixture and platelet-rich Symphony material over  the posterolateral regions infusion site.  A Viper retractor was then  inserted.  Osteotome then used to resect the inferior articular process of  L4, left side and to resect portions of the superior articular process of L5  on the right side, performing lateral recess decompression  foraminotomy  bilateral at the L5.   The Kerrisons were felt to be causing some tearing of bone with this removal  and a small neural bleb occurred over the superior aspect of the lamina of  L5.  This is in an area of hypertrophic bone formation adding to the  foraminal entrapment of the L5 nerve root, right side.  With resection of  the bone, the bleb was found to be present.  It was felt to be secondary to  the small spike of bone occurring with removal of bone using the Kerrison. These Kerrisons were passed  off.  Different Kerrisons were used for the  remainder of the case.   Central laminectomy was carried up to the L3-4 level.  Here the ligamentous  flavum was resected bilaterally, decompressing the recess of both right and  left sides.  This central portion of the canal at the L3-4 level as well  as  L4-5.  Foraminotomies were performed over the L4 nerve roots on both sides  and L4-5 carried out to resect the ligamentous flavum over the medial aspect  of the facet at L4-5, both sides, and resecting additional joint to allow  for complete decompression of the lateral recess at the medial aspect of the  facet at L4-5 to the level of the L5 pedicle bilaterally.  This then was  found to be adequate decompression.  Gelfoam placed over the laminotomy  region.  The right L4-5 facet was nearly completely resected in its entirety  and decompression in the right L4 nerve root at this level.  Exposure then  further obtained over the transverse process at L4 and L5 on the right side.  C-arm fluoroscopy was brought into the field.  An awl used to make an  initial entry point into the area of the intersection of the transverse  process and lateral portion of the superior articular process of L4, at this  entry point and into the pedicle and pedicle finder then passed using C-arm  fluoroscopy to ascertain correct position of the alignment of this pedicle  finder.  Ball tip probe then passed to insure the cortices remained intact.  Tapping was performed using a 5.5 tap.  A 40 mm, 6.25 screw then placed on  the right side of L4.  Similarly this was done at the L5.  Note that  decortication of the transverse process was carried out following the  tapping of the pedicle and Symphony bone graft then placed over the  transverse process prior to placement of the patient's Monarch pedicle  screw.  The __________ at the L5 level on the right side. The left side  pedicle screws were then placed similarly again  exposing the transverse  process of L4-5 lateral aspect of the superior articular process of L4 and  L5.  Additional entry point of the pedicle using an awl and ball tip probe  used to additionally probe the pedicle channel, plus pedicle channel was  made using a pedicle finder.  In each case, a ball tip probe showed that the  probing and pedicle was complete without evidence of broach of the cortex.  Tapping was performed using 5.5 tap and 6.25 screws were placed on the left  side at L4 and at L5.  Curved 45 mm rods were then placed within the pedicle  screws.  Decompression of the spiral canal was then carried out on the right  side of the L4-5 level, thecal sac retracted medially and dural veins  cauterized over the right disk space at L4-5.  A 15-blade scalpel was used  to incise the disk on the right side.  Pituitary rongeurs then used to  resect the disk material.  Laminar spreader used to carefully spread between the spinous process of L3 and the superior aspect of L5.  Dilators were then  inserted in the disk space of L4-5 up to a size 11 mm dilator which provided  an excellent fit; 10 mm was felt to be a better size for this patient's back  as the 11 was felt to be too tight, would require too much nerve retraction.  Bleeders were controlled using bipolar electrocautery in addition to the  Surgicel and thrombin-soaked Gelfoam.  With this then, a trial was performed  using a 10 mm trial, this provided excellent tilt at the disc space at L4-5.  Additional preparation of the disk space was carried out using  pituitary  rongeurs.  Care was taken not to enter too deeply.   Curettage carried out over the superior aspect of the disk space of the  inferior end plate of L4, over the superior end plate of L5; curettaging the  cartilaginous endplates as such.  This was debrided using pituitary  rongeurs.  Then ring curets were used additionally debride the end plates  down to bleeding bone  end plates.  Care was taken to preserve the end plates  at this point.  This completed, 10 mm cage was impacted with bone graft that  was harvested from an incision superficial to be fascia of the area over  lumbodorsal fascia to the right iliac crest, incision on the right iliac  crest using electrocautery and subperiosteal dissection carried lateral and  medial exposing the crest posteriorly.  The 3/4-inch osteotome used to  resect tricortical and bicortical bone off the posterior aspect of the  posterior superior iliac spine and then gouges used to resect cancellous  bone graft that was used for the TLIF cage as well as packed into the  interbody region on the right side.  The TLIF impactors were used to  additionally impact bone graft prior to their insertion.  Once the TLIF was  impacted and subset at least 3 or 4 mm anterior to the posterior aspect of  the disk space, intraoperative floor was obtained to ascertain position and  alignment of the graft and appeared to be quite nice.  Note the bone graft  was placed into the intervertebral disk space prior to placing the leopard  cage.  Cancellous bone graft as well as corticocancellous bone graft was  placed anterior to the expected region of the cage and impacted in place  using a kicker for the cage.  This completed, the cage was impacted in  placed, kicked across the midline into a transverse orientation.  This  completed, the laminar spreader was removed.  Compression was obtained.  The  crossed 45 mm rods are placed through the fasteners and pedicle screws at  the L4 and L5 levels.  Caps were placed appropriately.  These were then  tightened to 80 foot pounds, first at the L4 level and then compression  obtained between L4 and L5 cross rod and the L5 fastener then connected the  rod at 80 foot pounds torsion.   This completed, irrigation was performed.  Tisseel was then placed over the dural tear repair site on the right side of  L4 and this completed the repair  quite nicely.  Valsalva at the end of the suture repair demonstrated no  further leak present.  Tisseel was an additional closure to prevent any leak  postoperatively and allow for immediate physical therapy.   This completed, the platelet-poor Symphony material was then sprayed over  the surgical incision site over the right iliac crest as well.  Thrombin-  soaked Gelfoam was placed in the right iliac crest bone harvest site region.  Irrigation performed.  The lumbodorsal fascia approximated to be proximal  gluteal fascia over the iliac crest posteriorly on the right side using  interrupted and a running stitch of #1 Vicryl.  A subcu stitch used to close  the dead space from the midline to the iliac crest.  The incision was  drained with a single medium Hemovac drain out of the right lower lumbar  region.  The paralumbar muscles were approximated at midline with  interrupted 4-0 Vicryl sutures.  Lumbodorsal fascia approximated  midline  with interrupted #1 figure-of-eight Vicryl sutures.  Deep subcutaneous  layers approximated with interrupted #1, more superficial layers with  interrupted 0 Vicryl sutures.  The most superficial layers with interrupted  2-0 Vicryl sutures and skin closed with running subcuticular stitch of 4-0  Vicryl.   Note that permanent C-arm images were obtained prior to closure of the  incision site.  These were kept for permanent documentation purposes.  The  final incision was closed using a running subcuticular stitch of 4-0 Vicryl,  tincture of Benzoin and Steri-Strips applied, 4 x 4s, ABD affixed to the  skin with Hypafix tape.  The patient then returned to a supine position and  she was returned to the recovery room intubated as she was quite large and  there was difficulty extubating her immediately postoperatively.  The  patient was in satisfactory condition when brought to the recovery room.                                                Kerrin Champagne, M.D.    Myra Rude  D:  08/13/2004  T:  08/14/2004  Job:  [id number]

## 2011-05-13 NOTE — Procedures (Signed)
[place]  Patient:    CRESCENT, GOTHAM                  MRN: [id number] Proc. Date: 02/27/01 Attending:  Anselmo Rod, M.D. CC:         Eduardo Osier. Sharyn Lull, M.D.   Procedure Report  DATE OF BIRTH:  August 16, 1941.  PROCEDURE:  Colonoscopy.  ENDOSCOPIST:  Anselmo Rod, M.D.  INSTRUMENT USED:  Olympus video colonoscope.  INDICATION FOR PROCEDURE:  A 70 year old African-American female with significant abdominal discomfort and a personal history of polyps.  Rule out recurrent polyps.  PREPROCEDURE PREPARATION:  Informed consent was procured from the patient. The patient was fasted for eight hours prior to the procedure and prepped with a bottle of magnesium citrate and a gallon of NuLytely the night prior to the procedure.  PREPROCEDURE PHYSICAL:  VITAL SIGNS:  The patient had stable vital signs.  NECK:  Supple.  CHEST:  Clear to auscultation.  S1, S2 regular.  ABDOMEN:  Soft with normal abdominal bowel sounds.  DESCRIPTION OF PROCEDURE:  The patient was placed in the left lateral decubitus position and sedated with 40 mg of Demerol and 4 mg of Versed intravenously.  Once the patient was adequately sedate and maintained on low-flow oxygen and continuous cardiac monitoring, the Olympus video colonoscope was advanced from the rectum to the cecum with difficulty secondary to a large amount of residual stool in the colon.  Small internal and external hemorrhoids were appreciated on retroflexion and anal inspection. No masses or polyps were seen.  Small lesions could have been missed secondary to inadequate prep.  The procedure was completed to the cecum.  The cecal base was not completely visualized because of a large amount of stool in the colon.  IMPRESSION: 1. Inadequate prep. 2. Small internal and external hemorrhoids.  RECOMMENDATIONS:  The patient has been advised to increase the fluid and fiber in her diet.  Further  workup for her abdominal pain will be done on an outpatient basis. DD:  02/27/01 TD:  02/27/01 Job: [id number]

## 2011-05-13 NOTE — H&P (Signed)
NAME:  [name], [name]                   ACCOUNT NO.:  [id number]   MEDICAL RECORD NO.:  [id number]                   PATIENT TYPE:  INP   LOCATION:  NA                                   [facility]ACILITY:  [facility]   PHYSICIAN:  Almedia Balls. Ranell Patrick, M.D.              DATE OF BIRTH:  [birth year]   DATE OF ADMISSION:  03/19/2003  DATE OF DISCHARGE:                                HISTORY & PHYSICAL   CHIEF COMPLAINT:  Left knee pain.   HISTORY OF PRESENT ILLNESS:  The patient is a 70 year old female with a long  history of left knee pain.  She is complaining of swelling in her left knee  and pain at night as well as pain at rest.  She is taking Vioxx and she has  also undergone an intra-articular cortisone injection.  Neither have helped  with the resolution of the symptoms.  Total knee arthroplasty has been  discussed with the patient and patient feels that it is best to go ahead and  get the arthroplasty done for definitive treatment of her knee.  The risks  and benefits of the surgery have been discussed with the patient and patient  wishes to proceed.   PAST MEDICAL HISTORY:  1. Myocardial infarction x2.  2. Hypertension.  3. Anxiety.  4. Coronary artery disease.   PAST SURGICAL HISTORY:  1. Right rotator cuff repair.  2. Right toe surgery x2.  3. Hiatal hernia repair.  4. Right hip surgery.   MEDICATIONS:  1. Nexium 40 mg one p.o. daily.  2. Furosemide 40 mg one p.o. daily.  3. Isosorbide 60 mg one p.o. daily.  4. Atenolol 25 mg one p.o. daily.  5. Vioxx 25 mg one p.o. daily.  6. Robaxin 500 mg one p.o. q.8h. p.r.n.  7. Vicodin one p.o. q.4-6h. p.r.n.  8. Risperdal 1 mg one p.o. q.h.s.  9. Ambien 10 mg one p.o. q.h.s.  10.      Paxil CR 25 mg one p.o. daily.  11.      _____ oyster shell calcium 500 mg.  12.      Aspirin which she is instructed to take.  13.      Nitroglycerin tablet p.r.n.  14.      Ventolin inhaler p.r.n.   ALLERGIES:  PENICILLIN causes nausea.   SOCIAL HISTORY:  The patient admits to social alcohol, occasional tobacco  use.  She lives in a [place] house with no steps.   FAMILY HISTORY:  Mother deceased, breast cancer.  Father unremarkable.   REVIEW OF SYSTEMS:  GENERAL:  Denies fevers, chills, night sweats, bleeding  tendencies.  CNS:  Positive headache.  Denies blurry/double vision,  seizures, headaches, paralysis.  RESPIRATORY:  Positive shortness of breath  with anxiety and exertion.  Denies productive cough, hemoptysis.  CARDIOVASCULAR:  Denies chest pain, angina, orthopnea.  GASTROINTESTINAL:  Denies nausea, vomiting, diarrhea, constipation, melena, bloody stools.  GENITOURINARY:  Denies dysuria, hematuria,  discharge.  MUSCULOSKELETAL:  Positive tingling in bilateral upper and lower extremities.   PHYSICAL EXAMINATION:  VITAL SIGNS:  Blood pressure 130/75, pulse 92,  respirations 12.  GENERAL:  Well-developed, well-nourished 70 year old female.  HEENT:  Normocephalic, atraumatic.  Pupils are equal, round, and reactive to  light.  NECK:  Supple.  No carotid bruit noted.  CHEST:  Clear to auscultation bilaterally.  No wheezes or crackles.  HEART:  Regular rate and rhythm.  No murmurs, rubs, or gallops.  ABDOMEN:  Soft, nontender, nondistended.  Positive bowel sounds x4.  EXTREMITIES:  Increased valgus alignment of left knee.  Range of motion from  0 to 95 degrees with pain at 95 degrees.  She has lateral greater than  medial joint line tenderness.  She has good distal pulses and  neurovascularly intact distally.  SKIN:  No rashes or lesions.   LABORATORIES:  X-ray reveals significant lateral compartment osteoarthritis  with moderate anterior compartment osteoarthritis with a 10-12 degree valgus  deformity.   IMPRESSION:  1. Osteoarthritis left knee.  2. Hypertension.  3. Myocardial infarction x2.  4. Coronary artery disease.  5. Anxiety.   PLAN:  The patient will be admitted to [facility] on March 19, 2003 to undergo a left total knee arthroplasty by Almedia Balls. Ranell Patrick, M.D.     Clarene Reamer, P.A.-C.                   Almedia Balls. Ranell Patrick, M.D.    SW/MEDQ  D:  03/13/2003  T:  03/13/2003  Job:  [id number]

## 2011-06-15 ENCOUNTER — Encounter (INDEPENDENT_AMBULATORY_CARE_PROVIDER_SITE_OTHER): Payer: Medicare Other

## 2011-06-15 DIAGNOSIS — I70229 Atherosclerosis of native arteries of extremities with rest pain, unspecified extremity: Secondary | ICD-10-CM

## 2011-06-15 DIAGNOSIS — Z48812 Encounter for surgical aftercare following surgery on the circulatory system: Secondary | ICD-10-CM

## 2011-06-16 NOTE — Procedures (Unsigned)
LOWER EXTREMITY ARTERIAL DUPLEX  INDICATION:  Right lower extremity angioplasty.  HISTORY: Diabetes:  Yes Cardiac:  Yes Hypertension:  Yes Smoking:  No Previous Surgery:  Right tibioperoneal trunk angioplasty on 01/13/2011.  SINGLE LEVEL ARTERIAL EXAM                         RIGHT                LEFT Brachial:               158                  160 Anterior tibial:        129                  168 Posterior tibial:       175                  196 Peroneal:               168 Ankle/Brachial Index:   1.09                 1.23  LOWER EXTREMITY ARTERIAL DUPLEX EXAM  DUPLEX: 1. Triphasic Doppler wave forms noted throughout the right lower     extremity arterial system. 2. There is a velocity of 221 cm/sec. noted in the right proximal     posterior tibial artery.  IMPRESSION: 1. Patent right tibioperoneal trunk angioplasty site with velocity of     221 cm/sec. noted in the right proximal posterior tibial artery. 2. Bilateral ankle brachial indices and Doppler waveforms suggest     normal perfusion of the bilateral lower extremities.  There is a     significant increase in the right ankle brachial index when     compared to the previous exam on 12/31/2010 with the left ankle     brachial index remaining stable.  ___________________________________________ Quita Skye. Hart Rochester, M.D.  CH/MEDQ  D:  06/16/2011  T:  06/16/2011  Job:  [id number]

## 2011-08-03 ENCOUNTER — Other Ambulatory Visit: Payer: Self-pay | Admitting: Obstetrics

## 2011-08-03 DIAGNOSIS — N644 Mastodynia: Secondary | ICD-10-CM

## 2011-08-17 ENCOUNTER — Ambulatory Visit
Admission: RE | Admit: 2011-08-17 | Discharge: 2011-08-17 | Disposition: A | Discharge status: Home / Self Care | Payer: Medicare Other | Source: Ambulatory Visit | Attending: Obstetrics | Admitting: Obstetrics

## 2011-08-17 DIAGNOSIS — N644 Mastodynia: Secondary | ICD-10-CM

## 2011-09-08 ENCOUNTER — Emergency Department (HOSPITAL_COMMUNITY): Payer: Medicare Other

## 2011-09-08 ENCOUNTER — Emergency Department (HOSPITAL_COMMUNITY)
Admission: EM | Admit: 2011-09-08 | Discharge: 2011-09-08 | Disposition: A | Discharge status: Other Acute Inpatient Hospital | Payer: Medicare Other | Source: Home / Self Care | Attending: Emergency Medicine | Admitting: Emergency Medicine

## 2011-09-08 ENCOUNTER — Inpatient Hospital Stay (HOSPITAL_COMMUNITY)
Admission: EM | Admit: 2011-09-08 | Discharge: 2011-09-13 | DRG: 026 | Disposition: A | Discharge status: Skilled Nursing Facility | Payer: Medicare Other | Attending: Surgery | Admitting: Surgery

## 2011-09-08 DIAGNOSIS — F329 Major depressive disorder, single episode, unspecified: Secondary | ICD-10-CM | POA: Diagnosis present

## 2011-09-08 DIAGNOSIS — W1809XA Striking against other object with subsequent fall, initial encounter: Secondary | ICD-10-CM | POA: Diagnosis present

## 2011-09-08 DIAGNOSIS — R079 Chest pain, unspecified: Secondary | ICD-10-CM | POA: Insufficient documentation

## 2011-09-08 DIAGNOSIS — F411 Generalized anxiety disorder: Secondary | ICD-10-CM | POA: Diagnosis present

## 2011-09-08 DIAGNOSIS — Z9181 History of falling: Secondary | ICD-10-CM

## 2011-09-08 DIAGNOSIS — Z8673 Personal history of transient ischemic attack (TIA), and cerebral infarction without residual deficits: Secondary | ICD-10-CM

## 2011-09-08 DIAGNOSIS — K219 Gastro-esophageal reflux disease without esophagitis: Secondary | ICD-10-CM | POA: Diagnosis present

## 2011-09-08 DIAGNOSIS — S139XXA Sprain of joints and ligaments of unspecified parts of neck, initial encounter: Secondary | ICD-10-CM | POA: Diagnosis present

## 2011-09-08 DIAGNOSIS — Z96649 Presence of unspecified artificial hip joint: Secondary | ICD-10-CM

## 2011-09-08 DIAGNOSIS — I739 Peripheral vascular disease, unspecified: Secondary | ICD-10-CM | POA: Diagnosis present

## 2011-09-08 DIAGNOSIS — M25569 Pain in unspecified knee: Secondary | ICD-10-CM | POA: Insufficient documentation

## 2011-09-08 DIAGNOSIS — S065X0A Traumatic subdural hemorrhage without loss of consciousness, initial encounter: Principal | ICD-10-CM | POA: Diagnosis present

## 2011-09-08 DIAGNOSIS — J4489 Other specified chronic obstructive pulmonary disease: Secondary | ICD-10-CM | POA: Diagnosis present

## 2011-09-08 DIAGNOSIS — M1990 Unspecified osteoarthritis, unspecified site: Secondary | ICD-10-CM | POA: Diagnosis present

## 2011-09-08 DIAGNOSIS — E785 Hyperlipidemia, unspecified: Secondary | ICD-10-CM | POA: Diagnosis present

## 2011-09-08 DIAGNOSIS — Z96659 Presence of unspecified artificial knee joint: Secondary | ICD-10-CM

## 2011-09-08 DIAGNOSIS — Z9849 Cataract extraction status, unspecified eye: Secondary | ICD-10-CM

## 2011-09-08 DIAGNOSIS — Z87891 Personal history of nicotine dependence: Secondary | ICD-10-CM

## 2011-09-08 DIAGNOSIS — S065X9A Traumatic subdural hemorrhage with loss of consciousness of unspecified duration, initial encounter: Secondary | ICD-10-CM

## 2011-09-08 DIAGNOSIS — I2510 Atherosclerotic heart disease of native coronary artery without angina pectoris: Secondary | ICD-10-CM | POA: Diagnosis present

## 2011-09-08 DIAGNOSIS — F3289 Other specified depressive episodes: Secondary | ICD-10-CM | POA: Diagnosis present

## 2011-09-08 DIAGNOSIS — E119 Type 2 diabetes mellitus without complications: Secondary | ICD-10-CM | POA: Diagnosis present

## 2011-09-08 DIAGNOSIS — M109 Gout, unspecified: Secondary | ICD-10-CM | POA: Diagnosis present

## 2011-09-08 DIAGNOSIS — R296 Repeated falls: Secondary | ICD-10-CM | POA: Insufficient documentation

## 2011-09-08 DIAGNOSIS — I252 Old myocardial infarction: Secondary | ICD-10-CM

## 2011-09-08 DIAGNOSIS — I10 Essential (primary) hypertension: Secondary | ICD-10-CM | POA: Diagnosis present

## 2011-09-08 DIAGNOSIS — J449 Chronic obstructive pulmonary disease, unspecified: Secondary | ICD-10-CM | POA: Diagnosis present

## 2011-09-08 DIAGNOSIS — Z880 Allergy status to penicillin: Secondary | ICD-10-CM

## 2011-09-08 DIAGNOSIS — D62 Acute posthemorrhagic anemia: Secondary | ICD-10-CM | POA: Diagnosis present

## 2011-09-08 DIAGNOSIS — Z79899 Other long term (current) drug therapy: Secondary | ICD-10-CM

## 2011-09-08 LAB — ABO/RH: ABO/RH(D): O POS

## 2011-09-08 LAB — DIFFERENTIAL
Lymphocytes Relative: 27 % (ref 12–46)
Lymphs Abs: 1.4 10*3/uL (ref 0.7–4.0)
Neutro Abs: 3.3 10*3/uL (ref 1.7–7.7)
Neutrophils Relative %: 61 % (ref 43–77)

## 2011-09-08 LAB — APTT: aPTT: 39 seconds — ABNORMAL HIGH (ref 24–37)

## 2011-09-08 LAB — PROTIME-INR: INR: 1.11 (ref 0.00–1.49)

## 2011-09-08 LAB — GLUCOSE, CAPILLARY: Glucose-Capillary: 136 mg/dL — ABNORMAL HIGH (ref 70–99)

## 2011-09-08 LAB — BASIC METABOLIC PANEL
BUN: 11 mg/dL (ref 6–23)
CO2: 31 mEq/L (ref 19–32)
Calcium: 10.2 mg/dL (ref 8.4–10.5)
Creatinine, Ser: 0.51 mg/dL (ref 0.50–1.10)

## 2011-09-08 LAB — CBC
HCT: 36.9 % (ref 36.0–46.0)
Hemoglobin: 11.8 g/dL — ABNORMAL LOW (ref 12.0–15.0)
MCV: 88.1 fL (ref 78.0–100.0)
RBC: 4.19 MIL/uL (ref 3.87–5.11)
WBC: 5.4 10*3/uL (ref 4.0–10.5)

## 2011-09-08 NOTE — H&P (Signed)
NAMEMASSA, PE NO.:  [id number]  MEDICAL RECORD NO.:  [id number]  LOCATION:  3107                         FACILITY:  [facility]  PHYSICIAN:  Juanetta Gosling, MDDATE OF BIRTH:  1941/07/31  DATE OF ADMISSION:  09/08/2011 DATE OF DISCHARGE:                             HISTORY & PHYSICAL   HISTORY OF PRESENT ILLNESS:  This is a 70 year old female who had a fall while sitting on a toilet today and hit the back of her head on a toilet.  She complains of headache and she could not concentrate, this occurred about 1 p.m.  She does have some amnesia to the event.  She was eventually presented to [facility] where she was noted to have a subdural hematoma and she was then transferred over to [facility] where she complains of headache and some neck pain to me.  PAST MEDICAL HISTORY:  History of an MI in 1997, history of a stroke with no residual deficit present, GERD, osteoarthritis, diabetes mellitus, hypertension, peripheral vascular disease.  PAST SURGICAL HISTORY:  Right total hip arthroplasty, bilateral total knee arthroplasties, and lumbar surgery as well as a lower extremity arteriogram.  SOCIAL HISTORY:  Denies any drugs.  She has not smoked in 8 years.  She does drink occasional alcohol.  ALLERGIES:  Are to PENICILLIN.  Medications are Pradaxa, atenolol, clonazepam, Cymbalta, Lasix, hydrocodone, Lamisil, lisinopril, metformin, Prilosec, simvastatin.  REVIEW OF SYSTEMS:  Otherwise negative.  She denies any chest pain recently.  PHYSICAL EXAMINATION:  VITAL SIGNS:  98.0, 81, 18, 150/97. HEENT:  She has tenderness over occiput.  She is alert and oriented x3 right now, although she does seem somewhat confused when she is speaking.  Pupils equal, round, and reactive.  Her face does not show any lesions, edema, ecchymosis.  She has no obvious oral trauma. NECK:  She is tender to lower C-spine, I have left a C-collar in place. PULMONARY:   Her lungs are clear bilaterally. HEART:  Regular rate and rhythm.  Her peripheral pulses are weak in her bilateral feet.  She does not have any murmurs that I can hear. ABDOMEN:  Soft, nontender, nondistended. PELVIS:  Shows no lesions and nontender. MUSCULOSKELETAL:  She has some tenderness in her right hip and as well as her right knee with multiple well-healed incisions on her lower extremities.  There is otherwise no deformity.  There is no deficits in a strength except due to some of her pain. BACK:  Shows no lesions or tenderness. NEUROLOGICALLY:  She has a GCS of 15.  She does not have any focal deficits.  She does however have some amnesia to her event.  LABORATORY EVALUATION:  Shows her to have a creatinine of 0.51, PTT of 39, and normal PT and INR.  CBC with a white blood cell count of 5.04, hematocrit 36.9, platelets are 175.  Her x-ray evaluation shows a right knee film with interval healing of an anterior proximal tibial fracture and her right total knee arthroplasty.  Her pelvis shows no fracture.  A chest x-ray shows no acute findings.  A CT of her head shows about a large acute right frontal parietal subdural hematoma with about 8 mL of right-to-left  subfalcine herniation.  ASSESSMENT:  Subdural hematoma.  PLAN:  Pradaxa reversal protocol, CT neck due to tenderness and urgent Neurosurgery consult for evaluation and likely need to go to the operating room.     Juanetta Gosling, MD     MCW/MEDQ  D:  09/08/2011  T:  09/08/2011  Job:  [id number]  Electronically Signed by Emelia Loron MD on 09/08/2011 09:53:36 PM

## 2011-09-09 ENCOUNTER — Inpatient Hospital Stay (HOSPITAL_COMMUNITY): Payer: Medicare Other

## 2011-09-09 LAB — GLUCOSE, CAPILLARY
Comment 1: 202141
Glucose-Capillary: 120 mg/dL — ABNORMAL HIGH (ref 70–99)
Glucose-Capillary: 80 mg/dL (ref 70–99)
Glucose-Capillary: 110 mg/dL — ABNORMAL HIGH (ref 70–99)
Glucose-Capillary: 101 mg/dL — ABNORMAL HIGH (ref 70–99)

## 2011-09-09 LAB — CBC
HCT: 36 % (ref 36.0–46.0)
MCH: 28.2 pg (ref 26.0–34.0)
MCHC: 32.8 g/dL (ref 30.0–36.0)
MCV: 85.9 fL (ref 78.0–100.0)
Platelets: 173 10*3/uL (ref 150–400)
RDW: 14.5 % (ref 11.5–15.5)
WBC: 8.5 10*3/uL (ref 4.0–10.5)

## 2011-09-09 LAB — BASIC METABOLIC PANEL
BUN: 8 mg/dL (ref 6–23)
Calcium: 9 mg/dL (ref 8.4–10.5)
Glucose, Bld: 112 mg/dL — ABNORMAL HIGH (ref 70–99)
Potassium: 3.4 mEq/L — ABNORMAL LOW (ref 3.5–5.1)

## 2011-09-09 LAB — CROSSMATCH
Unit division: 0
Unit division: 0

## 2011-09-09 LAB — PROTIME-INR
INR: 0.9 (ref 0.00–1.49)
Prothrombin Time: 12.3 seconds (ref 11.6–15.2)

## 2011-09-09 LAB — MRSA PCR SCREENING: MRSA by PCR: NEGATIVE

## 2011-09-10 LAB — CBC
HCT: 34.8 % — ABNORMAL LOW (ref 36.0–46.0)
MCH: 28.4 pg (ref 26.0–34.0)
MCHC: 33 g/dL (ref 30.0–36.0)
MCV: 85.9 fL (ref 78.0–100.0)
Platelets: 163 10*3/uL (ref 150–400)
RDW: 14.4 % (ref 11.5–15.5)
WBC: 7.8 10*3/uL (ref 4.0–10.5)

## 2011-09-10 LAB — GLUCOSE, CAPILLARY: Glucose-Capillary: 114 mg/dL — ABNORMAL HIGH (ref 70–99)

## 2011-09-11 LAB — GLUCOSE, CAPILLARY: Glucose-Capillary: 112 mg/dL — ABNORMAL HIGH (ref 70–99)

## 2011-09-11 LAB — BASIC METABOLIC PANEL
BUN: 11 mg/dL (ref 6–23)
CO2: 32 mEq/L (ref 19–32)
Calcium: 9.3 mg/dL (ref 8.4–10.5)
GFR calc non Af Amer: >60 mL/min (ref >60)
Glucose, Bld: 109 mg/dL — ABNORMAL HIGH (ref 70–99)
Sodium: 134 mEq/L — ABNORMAL LOW (ref 135–145)

## 2011-09-11 LAB — CBC
HCT: 35.2 % — ABNORMAL LOW (ref 36.0–46.0)
Hemoglobin: 11.5 g/dL — ABNORMAL LOW (ref 12.0–15.0)
MCH: 28.1 pg (ref 26.0–34.0)
MCHC: 32.7 g/dL (ref 30.0–36.0)
MCV: 86.1 fL (ref 78.0–100.0)
RBC: 4.09 MIL/uL (ref 3.87–5.11)

## 2011-09-11 NOTE — Op Note (Signed)
[name], PROFFIT NO.:  [id number]  MEDICAL RECORD NO.:  [id number]  LOCATION:                                 FACILITY:  PHYSICIAN:  Cristi Loron, M.D.DATE OF BIRTH:  [birth year]  DATE OF PROCEDURE:  09/08/2011 DATE OF DISCHARGE:                              OPERATIVE REPORT   BRIEF HISTORY:  The patient is a 70 year old black female who has taken several falls over last week.  She has developed a persistent headache. She came to the emergency department and worked up with a head CT and a neck CT, which demonstrated that the patient had a right subdural hematoma with significant midline shift.  The patient was on Pradaxa and this was reversed.  I discussed the situation with the patient and her sister.  I recommend she undergo surgery.  She has weighed the risks, benefits, and alternatives of the surgery and decided to proceed with the operation.  PREOPERATIVE DIAGNOSIS:  Right subdural hematoma.  POSTOPERATIVE DIAGNOSIS:  Right subdural hematoma.  PROCEDURE:  Right frontotemporal craniectomy for evacuation of subdural hematoma.  SURGEON:  Cristi Loron, MD  ASSISTANT:  None.  ANESTHESIA:  General endotracheal.  ESTIMATED BLOOD LOSS:  75 mL.  SPECIMENS:  None.  DRAINS:  None.  COMPLICATIONS:  None.  DESCRIPTION OF PROCEDURE:  The patient was brought to the operating room by the anesthesia team.  General endotracheal anesthesia was induced. The patient was remained in supine position.  Her calvarium was supported with the Mayfield three-point headrest.  Her head was turned to the left exposing her right scalp.  Scalp was then shaved with clippers and prepared with Betadine scrub and Betadine solution. Sterile drapes were applied.  I then injected the area to be incised with Marcaine with epinephrine solution.  I then used a scalpel to make a linear midline incision in the frontotemporal region.  I used a cerebellar retractor  for exposure exposing the underlying temporalis fascia and muscle.  I incised the temporalis fascia muscle with electrocautery.  The muscle was quite thick.  I exposed the lateral calvarium with the periosteal elevators.  I then used high-speed drill to create a temporal bur hole.  I then used a footplate device to create a craniotomy flap.  I elevated the flap with a Penfield #3 exposing underlying dura.  I then incised the dura in a cruciate fashion and encountered a subdural membrane.  I then pierced the membranes with bipolar electrocautery.  This will allow the subdural hematoma fluid under high pressure to be released.  It was more less of subacute subdural hematoma with some semi-solid flap, but mainly liquid.  I then irrigated subdural space with saline-bacitracin solution.  We encountered a deeper membrane just superficial to the brain.  We coagulated this and used Metzenbaum scissors to connect the subdural spaces.  We then continued to irrigated the wound out with bacitracin solution until the irrigant came back crystal clear.  We did not identify any active bleeding points.  At this point, we were satisfied with hemostasis.  We then reapproximated the patient's dura with interrupted and running 4-0 Nurolon suture.  I then laid a piece of Gelfoam over exposed dura.  We then removed the cerebellar retractor and then reapproximated the patient's temporalis fascia and muscle with interrupted 2-0 Vicryl suture, subcutaneous tissue with 2-0 Vicryl suture, and then skin with stainless steel staples.  The wound was then coated with bacitracin ointment.  Sterile dressing applied.  Drapes were removed.  The patient was subsequently extubated by the anesthesia team and transported to postanesthesia care unit in stable condition.  All sponge, instrument, and needle counts were correct in this case.     Cristi Loron, M.D.     JDJ/MEDQ  D:  09/08/2011  T:  09/09/2011  Job:   [id number]  Electronically Signed by Tressie Stalker M.D. on 09/11/2011 09:14:00 AM

## 2011-09-11 NOTE — Consult Note (Signed)
[name], [name] NO.:  [id number]  MEDICAL RECORD NO.:  [id number]  LOCATION:  [facility]                         FACILITY:  [facility]  PHYSICIAN:  Cristi Loron, M.D.DATE OF BIRTH:  [birth year]  DATE OF CONSULTATION:  09/08/2011 DATE OF DISCHARGE:                                CONSULTATION   CHIEF COMPLAINT:  Falls, headache.  HISTORY OF PRESENT ILLNESS:  The patient is a 70 year old black female who has taken multiple falls over the last week, at least three to her count.  The patient complains of headaches, some neck pain.  She was seen at the [facility] Emergency Department where she was worked up with a head CT which demonstrated she had a right frontal subdural hematoma and a neurosurgical consultation was requested.  The patient was subsequently transported to [facility] for further evaluation and management.  Presently, the patient is accompanied by her sister.  She complains of some neck soreness.  She also complains of headaches.  Of note, the patient has a history of coronary artery disease and took Pradaxa and this has been reversed via the protocol.  PAST MEDICAL HISTORY:  Positive for coronary artery disease, cerebrovascular disease.  She has had a myocardial infarction approximately in 1998.  Her cardiologist is Dr. Sharyn Lull.  She had cerebrovascular accident in 1989.  She fractured her right hip.  She has knee osteoarthritis, cataracts, hypertension and diabetes mellitus, gastroesophageal reflux disease, depression, asthma, hyperlipidemia.  PAST SURGICAL HISTORY:  She has had bilateral total knee replacements, right hip replacement, bilateral cataracts.  PRESENT MEDICATIONS:  Atenolol, clonazepam, Cymbalta, furosemide, hydrocodone, Lamisil, lisinopril, metformin, omeprazole.  DRUG ALLERGIES:  PENICILLIN.  FAMILY MEDICAL HISTORY:  Noncontributory.  SOCIAL HISTORY:  The patient is divorced.  She has three children.  She lives  in [place].  She is retired.  She quit smoking tobacco 6 years ago.  She denies drug use.  She drinks approximately six beers per day.  REVIEW OF SYSTEMS:  Negative except as above.  PHYSICAL EXAMINATION:  GENERAL:  A pleasant, somewhat unkempt 70 year old black female, in no apparent distress, in a cervical collar. HEENT:  Head normocephalic.  Her pupils are equal, round, and reactive to light, somewhat miotic.  She had bilateral lens implants. Extraocular muscles intact.  Oropharynx benign.  She was wearing dentures. NECK:  Supple without masses, deformities, tracheal deviation, jugular venous distention. THORAX:  Symmetric. ABDOMEN:  Soft. EXTREMITIES:  The patient has bilateral well-healed total knee replacement incisions. BACK:  Unremarkable. NEUROLOGIC:  The patient is alert and oriented x3.  Cranial nerves II through XII were examined bilaterally and grossly normal.  Vision and hearing grossly normal bilaterally except she has some left-sided presbycusis.  Motor strength is 5/5 in biceps, triceps, hand grip, quadriceps, gastrocnemius, dorsiflexors.  Her deep tendon reflexes are symmetric but diminished.  Cerebellar functions intact and rapid alternating movements of the upper extremities bilaterally.  Sensory function is intact to light touch sensation in all tested dermatomes bilaterally.  IMAGING STUDIES:  I reviewed the patient's head CT performed without contrast at [facility] on September 08, 2011.  It demonstrates she has a right frontal subdural hematoma with moderate midline shift.  ASSESSMENT  AND PLAN: 1. Right frontal subdural hematoma.  I have discussed the situation     with the patient and her sister.  She has a moderate size subdural     hematoma with midline shift.  Her Pradaxa has been reversed.  I     discussed various treatments including observation versus     craniotomy to evacuate subdural hematoma.  I explained the     procedure.  We  discussed the risks of surgery including risks of     anesthesia, hemorrhage, infection, seizures, stroke, recurrent     subdural hematoma, medical risks, etc. I have answered all the     patient's and her sister's questions.  She has weighed the risks,     benefits, and alternatives of surgery and would like to proceed     with the operation. 2. Neck pain.  We are going to get a neck CT given her history of     falls. 3. Multiple medical problems etc. as noted.     Cristi Loron, M.D.     JDJ/MEDQ  D:  09/08/2011  T:  09/08/2011  Job:  [id number]  cc:   Eduardo Osier. Sharyn Lull, M.D.  Electronically Signed by Tressie Stalker M.D. on 09/11/2011 09:13:35 AM

## 2011-09-12 DIAGNOSIS — S065X9A Traumatic subdural hemorrhage with loss of consciousness of unspecified duration, initial encounter: Secondary | ICD-10-CM

## 2011-09-12 LAB — CROSSMATCH
ABO/RH(D): O POS
Unit division: 0

## 2011-09-12 LAB — BASIC METABOLIC PANEL
Chloride: 98 mEq/L (ref 96–112)
GFR calc Af Amer: >60 mL/min (ref >60)
GFR calc non Af Amer: >60 mL/min (ref >60)
Glucose, Bld: 109 mg/dL — ABNORMAL HIGH (ref 70–99)
Potassium: 3.4 mEq/L — ABNORMAL LOW (ref 3.5–5.1)
Sodium: 137 mEq/L (ref 135–145)

## 2011-09-12 LAB — GLUCOSE, CAPILLARY: Glucose-Capillary: 110 mg/dL — ABNORMAL HIGH (ref 70–99)

## 2011-09-13 ENCOUNTER — Inpatient Hospital Stay (HOSPITAL_COMMUNITY): Payer: Medicare Other

## 2011-09-13 LAB — GLUCOSE, CAPILLARY
Glucose-Capillary: 103 mg/dL — ABNORMAL HIGH (ref 70–99)
Glucose-Capillary: 104 mg/dL — ABNORMAL HIGH (ref 70–99)
Glucose-Capillary: 125 mg/dL — ABNORMAL HIGH (ref 70–99)

## 2011-09-26 ENCOUNTER — Telehealth: Payer: Self-pay | Admitting: Internal Medicine

## 2011-09-29 NOTE — Discharge Summary (Signed)
NAMESHANTELLE, ALLES NO.:  [id number]  MEDICAL RECORD NO.:  [id number]  LOCATION:  3012                         FACILITY:  [facility]  PHYSICIAN:  Gabrielle Dare. Janee Morn, M.D.DATE OF BIRTH:  1941-10-23  DATE OF ADMISSION:  09/08/2011 DATE OF DISCHARGE:  09/13/2011                        DISCHARGE SUMMARY - REFERRING   DISCHARGE DIAGNOSES: 1. Fall. 2. Right subdural hematoma. 3. Acute blood loss anemia. 4. Diabetes. 5. Hypertension. 6. Coronary artery disease. 7. Status post cerebrovascular accident, on Pradaxa. 8. Anxiety disorder, not otherwise specified. 9. Gastroesophageal reflux disease. 10.Dyslipidemia. 11.Occasional alcohol use.  CONSULTANTS:  Dr. Lovell Sheehan for Neurosurgery.  PROCEDURES:  Craniotomy by Dr. Lovell Sheehan.  HISTORY OF PRESENT ILLNESS:  This is a 70 year old black female, who was trying to sit down on the toilet when she lost her balance and fell striking the back of her head.  She had a bad headache and could not concentrate.  She called EMS and she was brought in as a non-trauma code for evaluation.  In the emergency department, the patient's head CT showed a large right frontal subdural hematoma with mass effect and midline shift.  Neurosurgery was consulted and she was taken emergently to the operating room for craniotomy.  She was then transferred to the intensive care unit for further care.  The patient did undergo Pradaxa reversal based on the anticoagulation reversal protocol.  HOSPITAL COURSE:  The patient did quite well considering the extent of her injury and the surgery.  On the following day, she was alert and oriented and complaining only of a headache.  Physical and occupational therapy were consulted and began to work with the patient.  She had some mild acute blood loss anemia, but this did not require any transfusion. Unfortunately, physical therapy found some significant deficits and no one in her family was able to provide the  supervision and assistance she needed at home.  Therefore, a skilled nursing facility was sought out and obtained.  An early postoperative head CT showed expected postsurgical evolutionary changes, but because the patient continued to complain of headache throughout her entire stay, another followup head CT was scheduled to be performed on the day of discharge.  As long as Neurosurgery did not see anything concerning on that scan, she should be able to discharge to SNF in good condition to continue her convalescence and rehab.  DISCHARGE MEDICATIONS:  Percocet 5/325 take 1 to 2 p.o. q.4 h p.r.n. pain, a courtesy prescription #36 was given to the facility. She may resume the following home medications: 1. Advair Diskus 100/50 one inhalation twice daily. 2. Allopurinol 200 mg every morning. 3. Atenolol 50 mg one and half tablets every morning. 4. Clonazepam 0.5 mg by mouth every morning and a courtesy     prescription #3 was given to the facility. 5. Colace 100 mg by mouth daily at bedtime as needed for constipation.6. Cymbalta 1 capsule by mouth every morning. 7. Furosemide 20 mg by mouth every morning. 8. Lisinopril 10 mg by mouth every morning. 9. Metformin 500 mg by mouth twice daily. 10.Omeprazole 20 mg by mouth every morning. 11.Simvastatin 20 mg by mouth daily. 12.Ventolin inhaler 2 puffs inhaled every morning. 13.She is to stop taking her aspirin and  Pradaxa.  FOLLOWUP:  The patient will need to follow up with Dr. Lovell Sheehan and the facility should set up an appointment.  She will need to follow up with her primary care provider within the next 4 weeks, where a frank discussion on the risks and benefits of anticoagulation can be had with the patient, especially in light of her recent history of falls.  Follow up with the Trauma Service will be on an as-needed basis.     Earney Hamburg, P.A.   ______________________________ Gabrielle Dare. Janee Morn, M.D.    MJ/MEDQ  D:   09/13/2011  T:  09/13/2011  Job:  [id number]  cc:   Cristi Loron, M.D.  Electronically Signed by Charma Igo P.A. on 09/23/2011 03:18:49 PM Electronically Signed by Violeta Gelinas M.D. on 09/29/2011 56:21:30 PM

## 2011-10-03 LAB — CBC
HCT: 30.8 — ABNORMAL LOW
HCT: 35.8 — ABNORMAL LOW
Hemoglobin: 10.1 — ABNORMAL LOW
MCHC: 32.4
MCHC: 32.8
MCHC: 33.4
MCV: 85.2
MCV: 87.9
Platelets: 138 — ABNORMAL LOW
Platelets: 141 — ABNORMAL LOW
Platelets: 168
RDW: 14.2
RDW: 14.2
WBC: 9.6

## 2011-10-03 LAB — BASIC METABOLIC PANEL
BUN: 11
BUN: 16
BUN: 18
CO2: 28
CO2: 30
CO2: 34 — ABNORMAL HIGH
Calcium: 8.2 — ABNORMAL LOW
Calcium: 8.6
Chloride: 102
Chloride: 98
Creatinine, Ser: 0.82
Creatinine, Ser: 0.85
GFR calc non Af Amer: 59 — ABNORMAL LOW
Glucose, Bld: 120 — ABNORMAL HIGH
Glucose, Bld: 125 — ABNORMAL HIGH
Potassium: 4.8
Potassium: 5.1
Sodium: 138

## 2011-10-03 LAB — PROTIME-INR
INR: 1.9 — ABNORMAL HIGH
Prothrombin Time: 12.9
Prothrombin Time: 22.3 — ABNORMAL HIGH

## 2011-10-04 ENCOUNTER — Other Ambulatory Visit: Payer: Self-pay | Admitting: Neurosurgery

## 2011-10-04 DIAGNOSIS — S065X9A Traumatic subdural hemorrhage with loss of consciousness of unspecified duration, initial encounter: Secondary | ICD-10-CM

## 2011-10-04 LAB — BASIC METABOLIC PANEL
CO2: 32
Chloride: 101
GFR calc non Af Amer: >60
Glucose, Bld: 103 — ABNORMAL HIGH
Potassium: 4.3
Sodium: 140

## 2011-10-04 LAB — CBC
HCT: 42.8
Hemoglobin: 13.7
MCHC: 32.1
MCV: 87.1
RDW: 14.7

## 2011-10-04 LAB — DIFFERENTIAL
Basophils Absolute: 0
Eosinophils Absolute: 0.2
Eosinophils Relative: 3
Lymphocytes Relative: 25
Monocytes Absolute: 0.8

## 2011-10-04 LAB — URINALYSIS, ROUTINE W REFLEX MICROSCOPIC
Glucose, UA: NEGATIVE
Ketones, ur: NEGATIVE
pH: 8.5 — ABNORMAL HIGH

## 2011-10-06 ENCOUNTER — Ambulatory Visit
Admission: RE | Admit: 2011-10-06 | Discharge: 2011-10-06 | Disposition: A | Discharge status: Home / Self Care | Payer: Medicare Other | Source: Ambulatory Visit | Attending: Neurosurgery | Admitting: Neurosurgery

## 2011-10-06 ENCOUNTER — Other Ambulatory Visit: Payer: Medicare Other

## 2011-10-06 DIAGNOSIS — S065X9A Traumatic subdural hemorrhage with loss of consciousness of unspecified duration, initial encounter: Secondary | ICD-10-CM

## 2011-10-10 LAB — CBC
HCT: 40.4
Hemoglobin: 13.1
WBC: 6.9

## 2011-10-10 LAB — COMPREHENSIVE METABOLIC PANEL
Alkaline Phosphatase: 84
BUN: 10
Chloride: 102
GFR calc non Af Amer: >60
Glucose, Bld: 108 — ABNORMAL HIGH
Potassium: 4.5
Total Bilirubin: 0.5

## 2011-10-21 ENCOUNTER — Other Ambulatory Visit: Payer: Self-pay | Admitting: Neurosurgery

## 2011-10-21 DIAGNOSIS — I6200 Nontraumatic subdural hemorrhage, unspecified: Secondary | ICD-10-CM

## 2011-10-24 ENCOUNTER — Ambulatory Visit
Admission: RE | Admit: 2011-10-24 | Discharge: 2011-10-24 | Disposition: A | Discharge status: Home / Self Care | Payer: PRIVATE HEALTH INSURANCE | Source: Ambulatory Visit | Attending: Neurosurgery | Admitting: Neurosurgery

## 2011-10-24 DIAGNOSIS — I6200 Nontraumatic subdural hemorrhage, unspecified: Secondary | ICD-10-CM

## 2011-10-24 MED ORDER — IOHEXOL 300 MG/ML  SOLN
75.0000 mL | Freq: Once | INTRAMUSCULAR | Status: AC | PRN
  Administered 2011-10-24: 75 mL via INTRAVENOUS

## 2011-11-09 ENCOUNTER — Other Ambulatory Visit (HOSPITAL_COMMUNITY): Payer: Self-pay | Admitting: Internal Medicine

## 2011-11-09 DIAGNOSIS — Z1231 Encounter for screening mammogram for malignant neoplasm of breast: Secondary | ICD-10-CM

## 2011-11-14 ENCOUNTER — Other Ambulatory Visit: Payer: Self-pay | Admitting: Neurosurgery

## 2011-11-14 DIAGNOSIS — S065X9A Traumatic subdural hemorrhage with loss of consciousness of unspecified duration, initial encounter: Secondary | ICD-10-CM

## 2011-12-02 ENCOUNTER — Ambulatory Visit: Payer: PRIVATE HEALTH INSURANCE

## 2011-12-08 ENCOUNTER — Ambulatory Visit
Admission: RE | Admit: 2011-12-08 | Discharge: 2011-12-08 | Disposition: A | Discharge status: Home / Self Care | Payer: PRIVATE HEALTH INSURANCE | Source: Ambulatory Visit | Attending: Neurosurgery | Admitting: Neurosurgery

## 2011-12-08 DIAGNOSIS — S065X9A Traumatic subdural hemorrhage with loss of consciousness of unspecified duration, initial encounter: Secondary | ICD-10-CM

## 2011-12-14 ENCOUNTER — Other Ambulatory Visit: Payer: Self-pay | Admitting: Neurosurgery

## 2011-12-14 DIAGNOSIS — M542 Cervicalgia: Secondary | ICD-10-CM

## 2011-12-16 ENCOUNTER — Encounter (INDEPENDENT_AMBULATORY_CARE_PROVIDER_SITE_OTHER): Payer: PRIVATE HEALTH INSURANCE | Admitting: *Deleted

## 2011-12-16 DIAGNOSIS — Z48812 Encounter for surgical aftercare following surgery on the circulatory system: Secondary | ICD-10-CM

## 2011-12-16 DIAGNOSIS — I739 Peripheral vascular disease, unspecified: Secondary | ICD-10-CM

## 2011-12-16 NOTE — Procedures (Unsigned)
LOWER EXTREMITY ARTERIAL DUPLEX  INDICATION:  HISTORY: Diabetes:  Yes. Cardiac:  Yes. Hypertension:  Yes. Smoking:  No. Previous Surgery:  Right tibial-peroneal trunk angioplasty on 01/13/2011.  SINGLE LEVEL ARTERIAL EXAM                         RIGHT                LEFT Brachial:               156                  155 Anterior tibial:        171                  142 Posterior tibial:       180                  185 Peroneal: Ankle/Brachial Index:   1.15                 1.19  LOWER EXTREMITY ARTERIAL DUPLEX EXAM  DUPLEX:  Triphasic Doppler waveforms noted throughout the right lower extremity arterial system.  IMPRESSION: 1. Patent right tibial-peroneal trunk angioplasty site with a velocity     of 200 cm/s noted in the right proximal posterior tibial artery. 2. Bilateral ankle brachial indices and Doppler waveforms suggest     normal perfusion of the bilateral lower extremities.  ___________________________________________ [name], M.D.  EM/MEDQ  D:  12/16/2011  T:  12/16/2011  Job:  [id number]

## 2011-12-21 ENCOUNTER — Ambulatory Visit (HOSPITAL_COMMUNITY)
Admission: RE | Admit: 2011-12-21 | Discharge: 2011-12-21 | Disposition: A | Discharge status: Home / Self Care | Payer: PRIVATE HEALTH INSURANCE | Source: Ambulatory Visit | Attending: Internal Medicine | Admitting: Internal Medicine

## 2011-12-21 ENCOUNTER — Ambulatory Visit
Admission: RE | Admit: 2011-12-21 | Discharge: 2011-12-21 | Disposition: A | Discharge status: Home / Self Care | Payer: PRIVATE HEALTH INSURANCE | Source: Ambulatory Visit | Attending: Neurosurgery | Admitting: Neurosurgery

## 2011-12-21 DIAGNOSIS — Z1231 Encounter for screening mammogram for malignant neoplasm of breast: Secondary | ICD-10-CM

## 2011-12-21 DIAGNOSIS — M542 Cervicalgia: Secondary | ICD-10-CM

## 2012-01-04 ENCOUNTER — Other Ambulatory Visit: Payer: Self-pay | Admitting: *Deleted

## 2012-01-04 DIAGNOSIS — I739 Peripheral vascular disease, unspecified: Secondary | ICD-10-CM

## 2012-01-04 DIAGNOSIS — Z48812 Encounter for surgical aftercare following surgery on the circulatory system: Secondary | ICD-10-CM

## 2012-01-10 ENCOUNTER — Encounter: Payer: Self-pay | Admitting: Vascular Surgery

## 2012-07-10 ENCOUNTER — Other Ambulatory Visit: Payer: Self-pay | Admitting: Cardiology

## 2012-08-02 ENCOUNTER — Encounter: Payer: Self-pay | Admitting: Vascular Surgery

## 2012-09-04 ENCOUNTER — Encounter (HOSPITAL_COMMUNITY): Payer: Self-pay

## 2012-09-04 ENCOUNTER — Emergency Department (HOSPITAL_COMMUNITY)
Admission: EM | Admit: 2012-09-04 | Discharge: 2012-09-04 | Disposition: A | Discharge status: Home / Self Care | Payer: Medicare HMO | Attending: Emergency Medicine | Admitting: Emergency Medicine

## 2012-09-04 ENCOUNTER — Emergency Department (HOSPITAL_COMMUNITY): Payer: Medicare HMO

## 2012-09-04 DIAGNOSIS — M542 Cervicalgia: Secondary | ICD-10-CM | POA: Insufficient documentation

## 2012-09-04 DIAGNOSIS — S0990XA Unspecified injury of head, initial encounter: Secondary | ICD-10-CM | POA: Insufficient documentation

## 2012-09-04 DIAGNOSIS — E119 Type 2 diabetes mellitus without complications: Secondary | ICD-10-CM | POA: Insufficient documentation

## 2012-09-04 DIAGNOSIS — W108XXA Fall (on) (from) other stairs and steps, initial encounter: Secondary | ICD-10-CM | POA: Insufficient documentation

## 2012-09-04 DIAGNOSIS — J45909 Unspecified asthma, uncomplicated: Secondary | ICD-10-CM | POA: Insufficient documentation

## 2012-09-04 DIAGNOSIS — Z8673 Personal history of transient ischemic attack (TIA), and cerebral infarction without residual deficits: Secondary | ICD-10-CM | POA: Insufficient documentation

## 2012-09-04 DIAGNOSIS — I252 Old myocardial infarction: Secondary | ICD-10-CM | POA: Insufficient documentation

## 2012-09-04 HISTORY — DX: Cerebral infarction, unspecified: I63.9

## 2012-09-04 HISTORY — DX: Acute myocardial infarction, unspecified: I21.9

## 2012-09-04 HISTORY — DX: Unspecified asthma, uncomplicated: J45.909

## 2012-09-04 MED ORDER — HYDROCODONE-ACETAMINOPHEN 5-325 MG PO TABS: 1.0000 | ORAL_TABLET | ORAL | Status: AC | PRN

## 2012-09-04 MED ORDER — METHOCARBAMOL 500 MG PO TABS: 500.0000 mg | ORAL_TABLET | Freq: Two times a day (BID) | ORAL | Status: AC

## 2012-09-04 NOTE — ED Notes (Signed)
Pt here for fall backwards off bus, when [employer] was picking her up for md appt.

## 2012-09-04 NOTE — ED Notes (Signed)
Pt on stretcher and to ct.

## 2012-09-04 NOTE — ED Provider Notes (Signed)
History     CSN: [id number]  Arrival date & time 09/04/12  9528   First MD Initiated Contact with Patient 09/04/12 [phone]      Chief Complaint  Patient presents with  . Fall    (Consider location/radiation/quality/duration/timing/severity/associated sxs/prior treatment) HPI Pt states her knee gave out while boarding a bus and fell striking her head and neck. No LOC. She had mild blurred vision. She now c/o posterior cervical pain. No focal weakness. No numbness.  Past Medical History  Diagnosis Date  . Stroke   . Acute MI   . Asthma   . Diabetes mellitus     Past Surgical History  Procedure Date  . Joint replacement     No family history on file.  History  Substance Use Topics  . Smoking status: Not on file  . Smokeless tobacco: Not on file  . Alcohol Use: No    OB History    Grav Para Term Preterm Abortions TAB SAB Ect Mult Living                  Review of Systems  Constitutional: Negative for fever and chills.  HENT: Positive for neck pain. Negative for neck stiffness.   Respiratory: Negative for shortness of breath.   Cardiovascular: Negative for chest pain.  Gastrointestinal: Negative for nausea, vomiting and abdominal pain.  Musculoskeletal: Negative for back pain.  Skin: Negative for rash and wound.  Neurological: Negative for dizziness, syncope, weakness, light-headedness, numbness and headaches.    Allergies  Penicillins  Home Medications   Current Outpatient Rx  Name Route Sig Dispense Refill  . HYDROCODONE-ACETAMINOPHEN 5-500 MG PO TABS Oral Take 1 tablet by mouth every 6 (six) hours as needed. For pain    . METFORMIN HCL 500 MG PO TABS Oral Take 500 mg by mouth 2 (two) times daily with a meal.    . HYDROCODONE-ACETAMINOPHEN 5-325 MG PO TABS Oral Take 1 tablet by mouth every 4 (four) hours as needed for pain. 10 tablet 0  . METHOCARBAMOL 500 MG PO TABS Oral Take 1 tablet (500 mg total) by mouth 2 (two) times daily. 20 tablet 0    BP 135/79   Pulse 66  Temp 97.5 F (36.4 C) (Oral)  Resp 16  SpO2 96%  Physical Exam  Nursing note and vitals reviewed. Constitutional: She is oriented to person, place, and time. She appears well-developed and well-nourished. No distress.  HENT:  Head: Normocephalic and atraumatic.  Mouth/Throat: Oropharynx is clear and moist.  Eyes: EOM are normal. Pupils are equal, round, and reactive to light.  Neck:       c-collar in place  Cardiovascular: Normal rate and regular rhythm.   Pulmonary/Chest: Effort normal and breath sounds normal. No respiratory distress. She has no wheezes. She has no rales.  Abdominal: Soft. Bowel sounds are normal. There is no tenderness. There is no rebound and no guarding.  Musculoskeletal: Normal range of motion. She exhibits no edema and no tenderness.  Neurological: She is alert and oriented to person, place, and time.       5/5 motor, sensation intact  Skin: Skin is warm and dry. No rash noted. No erythema.  Psychiatric: She has a normal mood and affect. Her behavior is normal.    ED Course  Procedures (including critical care time)  Labs Reviewed - No data to display Ct Head Wo Contrast  09/04/2012  *RADIOLOGY REPORT*  Clinical Data:  Fall, hit back of head.  CT HEAD  WITHOUT CONTRAST CT CERVICAL SPINE WITHOUT CONTRAST  Technique:  Multidetector CT imaging of the head and cervical spine was performed following the standard protocol without intravenous contrast.  Multiplanar CT image reconstructions of the cervical spine were also generated.  Comparison:  12/08/2011  CT HEAD  Findings: Prior right frontal craniectomy. There is atrophy and chronic small vessel disease changes.  Old left occipital infarct, stable. No acute intracranial abnormality.  Specifically, no hemorrhage, hydrocephalus, mass lesion, acute infarction, or significant intracranial injury.  No acute calvarial abnormality. Visualized paranasal sinuses and mastoids clear.  Orbital soft tissues  unremarkable.  IMPRESSION: No acute intracranial abnormality.  Atrophy, chronic microvascular disease.  CT CERVICAL SPINE  Findings: Diffuse degenerative disc disease and facet disease. Large flowing anterior osteophytes in the mid and upper cervical spine.  Normal alignment.  Prevertebral soft tissues are normal. No fracture.  No epidural or paraspinal hematoma.  IMPRESSION: Advanced degenerative changes.  No acute findings.   Original Report Authenticated By: Cyndie Chime, M.D.    Ct Cervical Spine Wo Contrast  09/04/2012  *RADIOLOGY REPORT*  Clinical Data:  Fall, hit back of head.  CT HEAD WITHOUT CONTRAST CT CERVICAL SPINE WITHOUT CONTRAST  Technique:  Multidetector CT imaging of the head and cervical spine was performed following the standard protocol without intravenous contrast.  Multiplanar CT image reconstructions of the cervical spine were also generated.  Comparison:  12/08/2011  CT HEAD  Findings: Prior right frontal craniectomy. There is atrophy and chronic small vessel disease changes.  Old left occipital infarct, stable. No acute intracranial abnormality.  Specifically, no hemorrhage, hydrocephalus, mass lesion, acute infarction, or significant intracranial injury.  No acute calvarial abnormality. Visualized paranasal sinuses and mastoids clear.  Orbital soft tissues unremarkable.  IMPRESSION: No acute intracranial abnormality.  Atrophy, chronic microvascular disease.  CT CERVICAL SPINE  Findings: Diffuse degenerative disc disease and facet disease. Large flowing anterior osteophytes in the mid and upper cervical spine.  Normal alignment.  Prevertebral soft tissues are normal. No fracture.  No epidural or paraspinal hematoma.  IMPRESSION: Advanced degenerative changes.  No acute findings.   Original Report Authenticated By: Cyndie Chime, M.D.      1. Closed head injury       MDM          Loren Racer, MD 09/04/12 1037

## 2012-09-20 ENCOUNTER — Ambulatory Visit: Payer: PRIVATE HEALTH INSURANCE | Admitting: Neurosurgery

## 2012-10-01 ENCOUNTER — Encounter: Payer: Self-pay | Admitting: Neurosurgery

## 2012-10-02 ENCOUNTER — Ambulatory Visit: Payer: PRIVATE HEALTH INSURANCE | Admitting: Neurosurgery

## 2012-10-15 ENCOUNTER — Encounter: Payer: Self-pay | Admitting: Neurosurgery

## 2012-10-16 ENCOUNTER — Ambulatory Visit: Payer: PRIVATE HEALTH INSURANCE | Admitting: Neurosurgery

## 2012-11-20 ENCOUNTER — Other Ambulatory Visit: Payer: Self-pay | Admitting: Obstetrics

## 2012-11-20 DIAGNOSIS — Z1231 Encounter for screening mammogram for malignant neoplasm of breast: Secondary | ICD-10-CM

## 2012-12-24 ENCOUNTER — Ambulatory Visit (HOSPITAL_COMMUNITY)
Admission: RE | Admit: 2012-12-24 | Discharge: 2012-12-24 | Disposition: A | Discharge status: Home / Self Care | Payer: Medicare HMO | Source: Ambulatory Visit | Attending: Obstetrics | Admitting: Obstetrics

## 2012-12-24 DIAGNOSIS — Z1231 Encounter for screening mammogram for malignant neoplasm of breast: Secondary | ICD-10-CM

## 2012-12-31 ENCOUNTER — Other Ambulatory Visit: Payer: Self-pay | Admitting: Obstetrics

## 2012-12-31 DIAGNOSIS — R928 Other abnormal and inconclusive findings on diagnostic imaging of breast: Secondary | ICD-10-CM

## 2013-01-04 ENCOUNTER — Ambulatory Visit
Admission: RE | Admit: 2013-01-04 | Discharge: 2013-01-04 | Disposition: A | Discharge status: Home / Self Care | Payer: Medicare HMO | Source: Ambulatory Visit | Attending: Obstetrics | Admitting: Obstetrics

## 2013-01-04 DIAGNOSIS — R928 Other abnormal and inconclusive findings on diagnostic imaging of breast: Secondary | ICD-10-CM

## 2013-02-05 ENCOUNTER — Other Ambulatory Visit: Payer: Self-pay | Admitting: Internal Medicine

## 2013-02-05 DIAGNOSIS — Z780 Asymptomatic menopausal state: Secondary | ICD-10-CM

## 2013-02-14 ENCOUNTER — Other Ambulatory Visit: Payer: Medicare HMO

## 2013-03-15 ENCOUNTER — Other Ambulatory Visit: Payer: Self-pay | Admitting: Gastroenterology

## 2013-08-01 ENCOUNTER — Other Ambulatory Visit: Payer: Self-pay | Admitting: *Deleted

## 2013-08-01 DIAGNOSIS — Z48812 Encounter for surgical aftercare following surgery on the circulatory system: Secondary | ICD-10-CM

## 2013-08-01 DIAGNOSIS — I739 Peripheral vascular disease, unspecified: Secondary | ICD-10-CM

## 2013-08-06 ENCOUNTER — Other Ambulatory Visit: Payer: Self-pay | Admitting: Internal Medicine

## 2013-08-06 DIAGNOSIS — E2839 Other primary ovarian failure: Secondary | ICD-10-CM

## 2013-10-04 ENCOUNTER — Inpatient Hospital Stay (HOSPITAL_COMMUNITY): Admission: RE | Admit: 2013-10-04 | Payer: Medicare HMO | Source: Ambulatory Visit

## 2013-12-13 ENCOUNTER — Other Ambulatory Visit: Payer: Self-pay | Admitting: Obstetrics

## 2013-12-13 DIAGNOSIS — Z1231 Encounter for screening mammogram for malignant neoplasm of breast: Secondary | ICD-10-CM

## 2014-01-07 ENCOUNTER — Ambulatory Visit (HOSPITAL_COMMUNITY): Payer: Medicare HMO | Attending: Obstetrics

## 2014-01-07 ENCOUNTER — Ambulatory Visit (HOSPITAL_COMMUNITY): Payer: Medicare HMO

## 2014-03-06 ENCOUNTER — Encounter (HOSPITAL_COMMUNITY): Payer: Medicaid Other

## 2014-03-06 ENCOUNTER — Other Ambulatory Visit (HOSPITAL_COMMUNITY): Payer: Self-pay | Admitting: Orthopedic Surgery

## 2014-03-06 DIAGNOSIS — M25569 Pain in unspecified knee: Secondary | ICD-10-CM

## 2014-03-07 ENCOUNTER — Ambulatory Visit (HOSPITAL_COMMUNITY)
Admission: RE | Admit: 2014-03-07 | Discharge: 2014-03-07 | Disposition: A | Discharge status: Home / Self Care | Payer: Medicare HMO | Source: Ambulatory Visit | Attending: Orthopedic Surgery | Admitting: Orthopedic Surgery

## 2014-03-07 DIAGNOSIS — M25569 Pain in unspecified knee: Secondary | ICD-10-CM

## 2014-03-07 DIAGNOSIS — M79609 Pain in unspecified limb: Secondary | ICD-10-CM | POA: Diagnosis present

## 2014-03-07 NOTE — Progress Notes (Addendum)
*  Preliminary Results* Right lower extremity venous duplex completed. Visualized veins of the right lower extremity are negative for deep vein thrombosis. There is no evidence of right Baker's cyst.  Attempted to call preliminary results in to Dr.Norris' office, however Claiborne Billings from the office was unable to find anyone to receive results. The patient will be discharged and can be reached by phone if necessary.  03/07/2014 9:19 AM  Maudry Mayhew, RVT, RDCS, RDMS

## 2014-03-13 ENCOUNTER — Ambulatory Visit (HOSPITAL_COMMUNITY)
Admission: RE | Admit: 2014-03-13 | Discharge: 2014-03-13 | Disposition: A | Discharge status: Home / Self Care | Payer: Medicare HMO | Source: Ambulatory Visit | Attending: Obstetrics | Admitting: Obstetrics

## 2014-03-13 DIAGNOSIS — Z1231 Encounter for screening mammogram for malignant neoplasm of breast: Secondary | ICD-10-CM | POA: Insufficient documentation

## 2014-03-18 ENCOUNTER — Encounter: Payer: Self-pay | Admitting: Obstetrics

## 2014-03-18 ENCOUNTER — Ambulatory Visit (INDEPENDENT_AMBULATORY_CARE_PROVIDER_SITE_OTHER): Payer: Medicare HMO | Admitting: Obstetrics

## 2014-03-18 VITALS — BP 144/89 | HR 75 | Wt 235.0 lb

## 2014-03-18 DIAGNOSIS — Z124 Encounter for screening for malignant neoplasm of cervix: Secondary | ICD-10-CM

## 2014-03-18 DIAGNOSIS — N926 Irregular menstruation, unspecified: Secondary | ICD-10-CM

## 2014-03-18 DIAGNOSIS — N760 Acute vaginitis: Secondary | ICD-10-CM | POA: Insufficient documentation

## 2014-03-18 DIAGNOSIS — N939 Abnormal uterine and vaginal bleeding, unspecified: Secondary | ICD-10-CM | POA: Insufficient documentation

## 2014-03-18 DIAGNOSIS — Z780 Asymptomatic menopausal state: Secondary | ICD-10-CM | POA: Insufficient documentation

## 2014-03-18 LAB — POCT URINALYSIS DIPSTICK
Bilirubin, UA: NEGATIVE
Blood, UA: NEGATIVE
Glucose, UA: NEGATIVE
Ketones, UA: NEGATIVE
Nitrite, UA: NEGATIVE
PH UA: 5
PROTEIN UA: NEGATIVE
SPEC GRAV UA: 1.015
UROBILINOGEN UA: NEGATIVE

## 2014-03-18 NOTE — Progress Notes (Signed)
Subjective:     [name] is a 73 y.o. female here for a routine exam.  Current complaints: pt states that she has noticed vaginal bleeding over the past couple weeks.  Pt states that it hasn't been heavy but has been clotting. Pt states that she has had some lower pelvic pain.  Pt would like to discuss osteoporosis and bone density. Pt states that she is in need of pap smear.  Pt also states that she is having a strong smell to her odor.  Personal health questionnaire reviewed: yes.   Gynecologic History No LMP recorded. Patient is postmenopausal. Contraception: post menopausal status Last Pap: unsure Last mammogram: 2015. Results were: results are pending  Obstetric History OB History  No data available     The following portions of the patient's history were reviewed and updated as appropriate: allergies, current medications, past family history, past medical history, past social history, past surgical history and problem list.  Review of Systems Pertinent items are noted in HPI.    Objective:    General appearance: alert and no distress Breasts: normal appearance, no masses or tenderness Abdomen: normal findings: soft, non-tender Pelvic: cervix normal in appearance, external genitalia normal, no adnexal masses or tenderness, no cervical motion tenderness, rectovaginal septum normal, uterus normal size, shape, and consistency and vagina normal without discharge Extremities: extremities normal, atraumatic, no cyanosis or edema    Assessment:    Postmenopausal bleeding.    Plan:    Education reviewed: calcium supplements, self breast exams and postmenopausal blleding. Follow up in: 2 weeks. Ultrasound ordered.

## 2014-03-19 ENCOUNTER — Encounter: Payer: Self-pay | Admitting: Obstetrics

## 2014-03-19 LAB — PAP IG W/ RFLX HPV ASCU

## 2014-03-19 LAB — WET PREP BY MOLECULAR PROBE
CANDIDA SPECIES: NEGATIVE
Gardnerella vaginalis: POSITIVE — AB
Trichomonas vaginosis: NEGATIVE

## 2014-04-01 ENCOUNTER — Ambulatory Visit (HOSPITAL_COMMUNITY): Admission: RE | Admit: 2014-04-01 | Payer: Medicare HMO | Source: Ambulatory Visit

## 2014-04-01 ENCOUNTER — Ambulatory Visit (INDEPENDENT_AMBULATORY_CARE_PROVIDER_SITE_OTHER): Payer: Medicare HMO | Admitting: Obstetrics

## 2014-04-01 DIAGNOSIS — N926 Irregular menstruation, unspecified: Secondary | ICD-10-CM

## 2014-04-01 DIAGNOSIS — N760 Acute vaginitis: Secondary | ICD-10-CM

## 2014-04-01 DIAGNOSIS — N939 Abnormal uterine and vaginal bleeding, unspecified: Secondary | ICD-10-CM

## 2014-04-01 DIAGNOSIS — M1990 Unspecified osteoarthritis, unspecified site: Secondary | ICD-10-CM

## 2014-04-01 DIAGNOSIS — M129 Arthropathy, unspecified: Secondary | ICD-10-CM

## 2014-04-01 NOTE — Progress Notes (Signed)
Pt is in office today for f/u.  Pt was to have u/s performed and didn't get.  Pt would still like to see Dr Jodi Mourning today to discuss treatment and f/u. Pt states that she is no longer having any bleeding today.

## 2014-04-02 ENCOUNTER — Encounter: Payer: Self-pay | Admitting: Obstetrics

## 2014-04-07 ENCOUNTER — Encounter: Payer: Self-pay | Admitting: Obstetrics

## 2014-04-07 DIAGNOSIS — M1990 Unspecified osteoarthritis, unspecified site: Secondary | ICD-10-CM | POA: Insufficient documentation

## 2014-04-07 NOTE — Progress Notes (Signed)
Subjective:     [name] is a 73 y.o. female here for follow up of vaginal bleeding..  Current complaints: arthritis.    Personal health questionnaire:  Is patient Ashkenazi Jewish, have a family history of breast and/or ovarian cancer: no Is there a family history of uterine cancer diagnosed at age < 73, gastrointestinal cancer, urinary tract cancer, family member who is a Field seismologist syndrome-associated carrier: no Is the patient overweight and hypertensive, family history of diabetes, personal history of gestational diabetes or PCOS: no Is patient over 72, have PCOS,  family history of premature CHD under age 78, diabetes, smoke, have hypertension or peripheral artery disease:  no  The HPI was reviewed and explored in further detail by the provider. Gynecologic History No LMP recorded. Patient is postmenopausal. Contraception: post menopausal status Last Pap: 2014. Results were: normal Last mammogram: 2014. Results were: normal  Obstetric History OB History  No data available     The following portions of the patient's history were reviewed and updated as appropriate: allergies, current medications, past family history, past medical history, past social history, past surgical history and problem list.  Review of Systems Pertinent items are noted in HPI.    Objective:    No exam performed today, Consult only.    100% of 10 min visit spent on counseling and coordination of care.   Assessment:    Postmenopausal bleeding.  Stable.     Plan:    Education reviewed: Management of postmenopausal bleeding. Follow up in: a few weeks. Ultrasound ordered

## 2014-04-14 ENCOUNTER — Other Ambulatory Visit: Payer: Self-pay | Admitting: *Deleted

## 2014-04-14 ENCOUNTER — Encounter: Payer: Self-pay | Admitting: Obstetrics

## 2014-04-14 DIAGNOSIS — N760 Acute vaginitis: Secondary | ICD-10-CM

## 2014-04-14 MED ORDER — METRONIDAZOLE 500 MG PO TABS: 500.0000 mg | ORAL_TABLET | Freq: Two times a day (BID) | ORAL | Status: DC

## 2014-04-16 ENCOUNTER — Ambulatory Visit (HOSPITAL_COMMUNITY): Admission: RE | Admit: 2014-04-16 | Payer: Medicare HMO | Source: Ambulatory Visit

## 2014-04-17 ENCOUNTER — Ambulatory Visit (HOSPITAL_COMMUNITY)
Admission: RE | Admit: 2014-04-17 | Discharge: 2014-04-17 | Disposition: A | Discharge status: Home / Self Care | Payer: Medicare HMO | Source: Ambulatory Visit | Attending: Obstetrics | Admitting: Obstetrics

## 2014-04-17 DIAGNOSIS — Z13820 Encounter for screening for osteoporosis: Secondary | ICD-10-CM | POA: Insufficient documentation

## 2014-05-08 ENCOUNTER — Emergency Department (HOSPITAL_COMMUNITY)
Admission: EM | Admit: 2014-05-08 | Discharge: 2014-05-08 | Disposition: A | Discharge status: Home / Self Care | Payer: Medicare HMO | Attending: Emergency Medicine | Admitting: Emergency Medicine

## 2014-05-08 ENCOUNTER — Encounter (HOSPITAL_COMMUNITY): Payer: Self-pay | Admitting: Emergency Medicine

## 2014-05-08 DIAGNOSIS — M1990 Unspecified osteoarthritis, unspecified site: Secondary | ICD-10-CM

## 2014-05-08 DIAGNOSIS — I252 Old myocardial infarction: Secondary | ICD-10-CM | POA: Insufficient documentation

## 2014-05-08 DIAGNOSIS — M7989 Other specified soft tissue disorders: Secondary | ICD-10-CM

## 2014-05-08 DIAGNOSIS — J45909 Unspecified asthma, uncomplicated: Secondary | ICD-10-CM | POA: Insufficient documentation

## 2014-05-08 DIAGNOSIS — E119 Type 2 diabetes mellitus without complications: Secondary | ICD-10-CM | POA: Insufficient documentation

## 2014-05-08 DIAGNOSIS — Z9181 History of falling: Secondary | ICD-10-CM | POA: Insufficient documentation

## 2014-05-08 DIAGNOSIS — Z8673 Personal history of transient ischemic attack (TIA), and cerebral infarction without residual deficits: Secondary | ICD-10-CM | POA: Insufficient documentation

## 2014-05-08 DIAGNOSIS — IMO0002 Reserved for concepts with insufficient information to code with codable children: Secondary | ICD-10-CM | POA: Insufficient documentation

## 2014-05-08 DIAGNOSIS — Z79899 Other long term (current) drug therapy: Secondary | ICD-10-CM | POA: Insufficient documentation

## 2014-05-08 DIAGNOSIS — E669 Obesity, unspecified: Secondary | ICD-10-CM | POA: Insufficient documentation

## 2014-05-08 DIAGNOSIS — Z87891 Personal history of nicotine dependence: Secondary | ICD-10-CM | POA: Insufficient documentation

## 2014-05-08 DIAGNOSIS — Z880 Allergy status to penicillin: Secondary | ICD-10-CM | POA: Insufficient documentation

## 2014-05-08 DIAGNOSIS — Z8781 Personal history of (healed) traumatic fracture: Secondary | ICD-10-CM | POA: Insufficient documentation

## 2014-05-08 DIAGNOSIS — R609 Edema, unspecified: Secondary | ICD-10-CM | POA: Insufficient documentation

## 2014-05-08 LAB — CBC WITH DIFFERENTIAL/PLATELET
BASOS ABS: 0 10*3/uL (ref 0.0–0.1)
Basophils Relative: 0 % (ref 0–1)
EOS PCT: 5 % (ref 0–5)
Eosinophils Absolute: 0.3 10*3/uL (ref 0.0–0.7)
HCT: 40.3 % (ref 36.0–46.0)
Hemoglobin: 12.5 g/dL (ref 12.0–15.0)
Lymphocytes Relative: 34 % (ref 12–46)
Lymphs Abs: 2 10*3/uL (ref 0.7–4.0)
MCH: 28.2 pg (ref 26.0–34.0)
MCHC: 31 g/dL (ref 30.0–36.0)
MCV: 91 fL (ref 78.0–100.0)
Monocytes Absolute: 0.4 10*3/uL (ref 0.1–1.0)
Monocytes Relative: 7 % (ref 3–12)
NEUTROS ABS: 3.1 10*3/uL (ref 1.7–7.7)
Neutrophils Relative %: 54 % (ref 43–77)
PLATELETS: 154 10*3/uL (ref 150–400)
RBC: 4.43 MIL/uL (ref 3.87–5.11)
RDW: 14.2 % (ref 11.5–15.5)
WBC: 5.7 10*3/uL (ref 4.0–10.5)

## 2014-05-08 LAB — BASIC METABOLIC PANEL
BUN: 27 mg/dL — ABNORMAL HIGH (ref 6–23)
CALCIUM: 9.9 mg/dL (ref 8.4–10.5)
CO2: 28 mEq/L (ref 19–32)
Chloride: 103 mEq/L (ref 96–112)
Creatinine, Ser: 0.84 mg/dL (ref 0.50–1.10)
GFR calc Af Amer: 79 mL/min — ABNORMAL LOW (ref >90)
GFR calc non Af Amer: 68 mL/min — ABNORMAL LOW (ref >90)
Glucose, Bld: 117 mg/dL — ABNORMAL HIGH (ref 70–99)
POTASSIUM: 4 meq/L (ref 3.7–5.3)
Sodium: 142 mEq/L (ref 137–147)

## 2014-05-08 NOTE — ED Notes (Signed)
TED Hose applied to both legs

## 2014-05-08 NOTE — ED Notes (Signed)
Per EMS- Patient was sent from [facility] with possible DVT. Patient reported that she has had left leg swelling x 1 year after a fall, but swelling is worse now.

## 2014-05-08 NOTE — ED Provider Notes (Signed)
CSN: [id number]     Arrival date & time 05/08/14  1030 History   First MD Initiated Contact with Patient 05/08/14 1042     Chief Complaint  Patient presents with  . Leg Swelling     (Consider location/radiation/quality/duration/timing/severity/associated sxs/prior Treatment) Patient is a 73 y.o. female presenting with leg pain. The history is provided by the patient. No language interpreter was used.  Leg Pain Location:  Leg Time since incident: 1 year. Injury: yes   Mechanism of injury: fall   Fall:    Entrapped after fall: no   Leg location:  R lower leg Pain details:    Quality:  Aching   Radiates to:  Does not radiate   Severity:  Moderate   Onset quality:  Unable to specify   Duration: weeks to months.   Timing:  Constant   Progression:  Worsening Chronicity:  Chronic Dislocation: no   Foreign body present:  No foreign bodies Tetanus status:  Unknown Prior injury to area:  Yes Relieved by:  Rest Worsened by:  Activity and bearing weight Ineffective treatments:  None tried Associated symptoms: swelling   Associated symptoms: no back pain, no decreased ROM, no fatigue, no fever, no neck pain and no numbness   Risk factors: frequent fractures and obesity     Past Medical History  Diagnosis Date  . Stroke   . Acute MI   . Asthma   . Diabetes mellitus    Past Surgical History  Procedure Laterality Date  . Joint replacement    . Back surgery     Family History  Problem Relation Age of Onset  . Cancer Brother    History  Substance Use Topics  . Smoking status: Former Research scientist (life sciences)  . Smokeless tobacco: Never Used  . Alcohol Use: No   OB History   Grav Para Term Preterm Abortions TAB SAB Ect Mult Living                 Review of Systems  Constitutional: Negative for fever, chills, diaphoresis, activity change, appetite change and fatigue.  HENT: Negative for congestion, facial swelling, rhinorrhea and sore throat.   Eyes: Negative for photophobia and  discharge.  Respiratory: Negative for cough, chest tightness and shortness of breath.   Cardiovascular: Positive for leg swelling. Negative for chest pain and palpitations.  Gastrointestinal: Negative for nausea, vomiting, abdominal pain and diarrhea.  Endocrine: Negative for polydipsia and polyuria.  Genitourinary: Negative for dysuria, frequency, difficulty urinating and pelvic pain.  Musculoskeletal: Negative for arthralgias, back pain, neck pain and neck stiffness.  Skin: Negative for color change and wound.  Allergic/Immunologic: Negative for immunocompromised state.  Neurological: Negative for facial asymmetry, weakness, numbness and headaches.  Hematological: Does not bruise/bleed easily.  Psychiatric/Behavioral: Negative for confusion and agitation.      Allergies  Penicillins  Home Medications   Prior to Admission medications   Medication Sig Start Date End Date Taking? Authorizing Provider  albuterol (VENTOLIN HFA) 108 (90 BASE) MCG/ACT inhaler Inhale 2 puffs into the lungs every 6 (six) hours as needed for wheezing or shortness of breath (SOB).   Yes Historical Provider, MD  allopurinol (ZYLOPRIM) 100 MG tablet Take 200 mg by mouth daily.   Yes Historical Provider, MD  atenolol (TENORMIN) 50 MG tablet Take 75 mg by mouth daily. Takes one and one half tablets   Yes Historical Provider, MD  carbamide peroxide (DEBROX) 6.5 % otic solution Place 5 drops into both ears 2 (two) times daily.  Yes Historical Provider, MD  clonazePAM (KLONOPIN) 0.5 MG tablet Take 0.5 mg by mouth 2 (two) times daily as needed for anxiety (anxiety).   Yes Historical Provider, MD  Fluticasone-Salmeterol (ADVAIR) 100-50 MCG/DOSE AEPB Inhale 1 puff into the lungs 2 (two) times daily.   Yes Historical Provider, MD  furosemide (LASIX) 40 MG tablet Take 40 mg by mouth daily as needed (leg swelling).   Yes Historical Provider, MD  lisinopril (PRINIVIL,ZESTRIL) 10 MG tablet Take 10 mg by mouth daily.   Yes  Historical Provider, MD  loratadine (CLARITIN) 10 MG tablet Take 10 mg by mouth daily.   Yes Historical Provider, MD  Menthol, Topical Analgesic, (BIOFREEZE) 4 % GEL Apply 1 application topically as needed (muscle pain).   Yes Historical Provider, MD  metFORMIN (GLUCOPHAGE) 500 MG tablet Take 500 mg by mouth 2 (two) times daily with a meal.   Yes Historical Provider, MD  omeprazole (PRILOSEC) 20 MG capsule Take 20 mg by mouth daily.   Yes Historical Provider, MD  simvastatin (ZOCOR) 20 MG tablet Take 20 mg by mouth daily.   Yes Historical Provider, MD   BP 133/61  Pulse 88  Temp(Src) 98 F (36.7 C) (Oral)  Resp 18  SpO2 98% Physical Exam  Constitutional: She is oriented to person, place, and time. She appears well-developed and well-nourished. No distress.  HENT:  Head: Normocephalic and atraumatic.  Mouth/Throat: No oropharyngeal exudate.  Eyes: Pupils are equal, round, and reactive to light.  Neck: Normal range of motion. Neck supple.  Cardiovascular: Normal rate, regular rhythm and normal heart sounds.  Exam reveals no gallop and no friction rub.   No murmur heard. Pulmonary/Chest: Effort normal and breath sounds normal. No respiratory distress. She has no wheezes. She has no rales.  Abdominal: Soft. Bowel sounds are normal. She exhibits no distension and no mass. There is no tenderness. There is no rebound and no guarding.  Musculoskeletal: Normal range of motion. She exhibits edema (R>L). She exhibits no tenderness.  Neurological: She is alert and oriented to person, place, and time.  Skin: Skin is warm and dry.  Psychiatric: She has a normal mood and affect.    ED Course  Procedures (including critical care time) Labs Review Labs Reviewed  BASIC METABOLIC PANEL - Abnormal; Notable for the following:    Glucose, Bld 117 (*)    BUN 27 (*)    GFR calc non Af Amer 68 (*)    GFR calc Af Amer 79 (*)    All other components within normal limits  CBC WITH DIFFERENTIAL     Imaging Review No results found.   EKG Interpretation None      MDM   Final diagnoses:  Peripheral edema    Pt is a 73 y.o. female with Pmhx as above who presents with about 1 yr of RLE edema after a fall, worse past several weeks to month with assoc pain w/ ambulation. Pt was sent from her orthopedist's office today for DVT r/o. Denies recent fall, fever, n/v, d/a, CP, ab pain. She states get gets SOB easily, but has not had any past several days. On PE, VSS, pt in NAD. She has BLLE pitting edema, 1+ on L 2+ on R with mild erythema. She has no fever, elevated WBC or well demarcated edges of erythema to suggest cellulitis DVT study negative. Suspect peripheral edema. Have rec compression stockings and outpt PCP f/u.         Neta Ehlers, MD 05/08/14  2052 

## 2014-05-08 NOTE — ED Notes (Signed)
A message left at sister's residence telephone Melvenia Beam) to pick patient up at the ED entrance.

## 2014-05-08 NOTE — Progress Notes (Signed)
Right lower extremity venous duplex completed.  Right:  No evidence of DVT, superficial thrombosis, or Baker's cyst.  Left:  Negative for DVT in the common femoral vein.  

## 2014-05-08 NOTE — ED Notes (Signed)
Patient requested that [facility] be called for transportation back home. Spoke with [facility] and are on their way to the ED Entrance. Pateint placed in a wheelchair and wanted to be left outside by the bench.

## 2014-05-08 NOTE — ED Notes (Signed)
Bed: LY65 Expected date:  Expected time:  Means of arrival:  Comments: EMS-LE swelling

## 2014-05-08 NOTE — Discharge Instructions (Signed)

## 2014-05-08 NOTE — ED Notes (Signed)
Venous doppler tech in room with the patient.

## 2014-06-02 ENCOUNTER — Other Ambulatory Visit: Payer: Self-pay | Admitting: Neurosurgery

## 2014-06-02 DIAGNOSIS — R519 Headache, unspecified: Secondary | ICD-10-CM

## 2014-06-02 DIAGNOSIS — R51 Headache: Principal | ICD-10-CM

## 2014-06-05 ENCOUNTER — Ambulatory Visit
Admission: RE | Admit: 2014-06-05 | Discharge: 2014-06-05 | Disposition: A | Discharge status: Home / Self Care | Payer: Commercial Managed Care - HMO | Source: Ambulatory Visit | Attending: Neurosurgery | Admitting: Neurosurgery

## 2014-06-05 DIAGNOSIS — R519 Headache, unspecified: Secondary | ICD-10-CM

## 2014-06-05 DIAGNOSIS — R51 Headache: Principal | ICD-10-CM

## 2014-11-05 ENCOUNTER — Inpatient Hospital Stay (HOSPITAL_COMMUNITY)
Admission: EM | Admit: 2014-11-05 | Discharge: 2014-11-08 | DRG: 603 | Disposition: A | Discharge status: Home / Self Care | Payer: PRIVATE HEALTH INSURANCE | Attending: Internal Medicine | Admitting: Internal Medicine

## 2014-11-05 ENCOUNTER — Encounter (HOSPITAL_COMMUNITY): Payer: Self-pay | Admitting: *Deleted

## 2014-11-05 DIAGNOSIS — R7989 Other specified abnormal findings of blood chemistry: Secondary | ICD-10-CM | POA: Diagnosis present

## 2014-11-05 DIAGNOSIS — I10 Essential (primary) hypertension: Secondary | ICD-10-CM

## 2014-11-05 DIAGNOSIS — N939 Abnormal uterine and vaginal bleeding, unspecified: Secondary | ICD-10-CM

## 2014-11-05 DIAGNOSIS — Z8673 Personal history of transient ischemic attack (TIA), and cerebral infarction without residual deficits: Secondary | ICD-10-CM

## 2014-11-05 DIAGNOSIS — M79604 Pain in right leg: Secondary | ICD-10-CM | POA: Diagnosis not present

## 2014-11-05 DIAGNOSIS — Z87891 Personal history of nicotine dependence: Secondary | ICD-10-CM

## 2014-11-05 DIAGNOSIS — L02419 Cutaneous abscess of limb, unspecified: Secondary | ICD-10-CM

## 2014-11-05 DIAGNOSIS — M1990 Unspecified osteoarthritis, unspecified site: Secondary | ICD-10-CM

## 2014-11-05 DIAGNOSIS — R52 Pain, unspecified: Secondary | ICD-10-CM

## 2014-11-05 DIAGNOSIS — I480 Paroxysmal atrial fibrillation: Secondary | ICD-10-CM

## 2014-11-05 DIAGNOSIS — E119 Type 2 diabetes mellitus without complications: Secondary | ICD-10-CM

## 2014-11-05 DIAGNOSIS — L03119 Cellulitis of unspecified part of limb: Principal | ICD-10-CM | POA: Diagnosis present

## 2014-11-05 DIAGNOSIS — I4891 Unspecified atrial fibrillation: Secondary | ICD-10-CM | POA: Diagnosis present

## 2014-11-05 DIAGNOSIS — Z880 Allergy status to penicillin: Secondary | ICD-10-CM

## 2014-11-05 DIAGNOSIS — J45909 Unspecified asthma, uncomplicated: Secondary | ICD-10-CM

## 2014-11-05 DIAGNOSIS — Z780 Asymptomatic menopausal state: Secondary | ICD-10-CM

## 2014-11-05 DIAGNOSIS — E876 Hypokalemia: Secondary | ICD-10-CM | POA: Diagnosis present

## 2014-11-05 DIAGNOSIS — I2510 Atherosclerotic heart disease of native coronary artery without angina pectoris: Secondary | ICD-10-CM

## 2014-11-05 NOTE — ED Provider Notes (Signed)
CSN: [id number]     Arrival date & time 11/05/14  2301 History   First MD Initiated Contact with Patient 11/05/14 2337     Chief Complaint  Patient presents with  . Leg Pain     (Consider location/radiation/quality/duration/timing/severity/associated sxs/prior Treatment) HPI Comments: [name] is a 73 y.o. female with a PMHx of CVA, AMI, HTN, CAD, HLD, obesity, gout, asthma, periph edema, arthritis s/p b/l knee replacements, and DM2, who presents to the ED with complaints of gradual onset RLE pain x2-3 months associated with erythema and swelling as well as numbness/tingling. Pain is 8/10 constant "pins and needles" sensation, located in RLE below the knee, nonradiating, worse with prolonged immobility, and improved mildly with bracing/wrapping leg and ibuprofen. She noticed that the area has become "darker" and erythematous as well as warm beginning 54month ago, and that her foot had become "darker". Additionally, she reports palpitations recently but can't recall when they began. Denies hx of arrhythmias. Denies fevers, chills, red streaking, itching, rash, CP, SOB, cough, hemoptysis, PND, orthopnea, DOE, claudication, abd pain, n/v/d/c, arthralgias, weakness, loss of mobility in extremities, hx of DVT/PE, recent surgery or prolonged immobilizaton, estrogen use, or injury to this leg. Denies skin injury, but states she scratched an area that was painful just above her R ankle. States she's complaint on all medications.   Patient is a 73y.o. female presenting with leg pain. The history is provided by the patient. No language interpreter was used.  Leg Pain Location:  Leg Time since incident:  2 months Injury: no   Leg location:  R lower leg Pain details:    Quality:  Tingling   Radiates to:  Does not radiate   Severity:  Moderate (8/10)   Onset quality:  Gradual   Duration:  2 months   Timing:  Constant   Progression:  Unchanged Chronicity:  New Dislocation: no   Tetanus  status:  Out of date Relieved by:  NSAIDs (ibuprofen and leg brace) Exacerbated by: prolonged immobility. Ineffective treatments:  None tried Associated symptoms: numbness, swelling and tingling   Associated symptoms: no back pain, no decreased ROM, no fever, no itching, no muscle weakness, no neck pain and no stiffness   Risk factors: obesity     Past Medical History  Diagnosis Date  . Stroke   . Acute MI   . Asthma   . Diabetes mellitus    Past Surgical History  Procedure Laterality Date  . Joint replacement    . Back surgery     Family History  Problem Relation Age of Onset  . Cancer Brother    History  Substance Use Topics  . Smoking status: Former SResearch scientist (life sciences) . Smokeless tobacco: Never Used  . Alcohol Use: No   OB History    No data available     Review of Systems  Constitutional: Negative for fever and chills.  Respiratory: Negative for cough, chest tightness, shortness of breath and wheezing.   Cardiovascular: Positive for palpitations and leg swelling. Negative for chest pain.  Gastrointestinal: Negative for nausea, vomiting, abdominal pain and constipation.  Genitourinary: Negative for dysuria and hematuria.  Musculoskeletal: Positive for myalgias (RLE pain below knee). Negative for back pain, joint swelling, arthralgias, stiffness and neck pain.  Skin: Positive for color change and wound (scratched R leg). Negative for itching.  Allergic/Immunologic: Positive for immunocompromised state.  Neurological: Positive for numbness. Negative for dizziness, tremors, weakness and light-headedness.       +paresthesias RLE  Psychiatric/Behavioral:  Negative for confusion.   10 Systems reviewed and are negative for acute change except as noted in the HPI.    Allergies  Penicillins  Home Medications   Prior to Admission medications   Medication Sig Start Date End Date Taking? Authorizing Provider  albuterol (VENTOLIN HFA) 108 (90 BASE) MCG/ACT inhaler Inhale 2 puffs  into the lungs every 6 (six) hours as needed for wheezing or shortness of breath (SOB).    Historical Provider, MD  allopurinol (ZYLOPRIM) 100 MG tablet Take 200 mg by mouth daily.    Historical Provider, MD  atenolol (TENORMIN) 50 MG tablet Take 75 mg by mouth daily. Takes one and one half tablets    Historical Provider, MD  carbamide peroxide (DEBROX) 6.5 % otic solution Place 5 drops into both ears 2 (two) times daily.    Historical Provider, MD  clonazePAM (KLONOPIN) 0.5 MG tablet Take 0.5 mg by mouth 2 (two) times daily as needed for anxiety (anxiety).    Historical Provider, MD  Fluticasone-Salmeterol (ADVAIR) 100-50 MCG/DOSE AEPB Inhale 1 puff into the lungs 2 (two) times daily.    Historical Provider, MD  furosemide (LASIX) 40 MG tablet Take 40 mg by mouth daily as needed (leg swelling).    Historical Provider, MD  lisinopril (PRINIVIL,ZESTRIL) 10 MG tablet Take 10 mg by mouth daily.    Historical Provider, MD  loratadine (CLARITIN) 10 MG tablet Take 10 mg by mouth daily.    Historical Provider, MD  Menthol, Topical Analgesic, (BIOFREEZE) 4 % GEL Apply 1 application topically as needed (muscle pain).    Historical Provider, MD  metFORMIN (GLUCOPHAGE) 500 MG tablet Take 500 mg by mouth 2 (two) times daily with a meal.    Historical Provider, MD  omeprazole (PRILOSEC) 20 MG capsule Take 20 mg by mouth daily.    Historical Provider, MD  simvastatin (ZOCOR) 20 MG tablet Take 20 mg by mouth daily.    Historical Provider, MD   BP 134/97 mmHg  Pulse 70  Temp(Src) 97.7 F (36.5 C) (Oral)  Resp 16  Ht 5' 4"  (1.626 m)  Wt 254 lb (115.214 kg)  BMI 43.58 kg/m2  SpO2 99% Physical Exam  Constitutional: She is oriented to person, place, and time. She appears well-developed and well-nourished.  Non-toxic appearance. No distress.  Tachycardic, afebrile and nontoxic, NAD, pleasant obese AAF  HENT:  Head: Normocephalic and atraumatic.  Mouth/Throat: Oropharynx is clear and moist and mucous membranes  are normal.  Eyes: Conjunctivae and EOM are normal. Right eye exhibits no discharge. Left eye exhibits no discharge.  Neck: Normal range of motion. Neck supple.  Cardiovascular: Normal heart sounds and intact distal pulses.  An irregularly irregular rhythm present. Tachycardia present.  Exam reveals no gallop and no friction rub.   No murmur heard. Pulses:      Dorsalis pedis pulses are 1+ on the right side, and 2+ on the left side.       Posterior tibial pulses are 1+ on the right side, and 2+ on the left side.  Irregularly irregular rhythm and tachycardic with HR ranging from 90-120s but remaining mostly in 110s. Distal pulses intact bilaterally although slightly diminished to palpation in RLE, located using doppler  Pulmonary/Chest: Effort normal and breath sounds normal. No respiratory distress. She has no decreased breath sounds. She has no wheezes. She has no rhonchi. She has no rales.  CTAB in all lung fields  Abdominal: Soft. Normal appearance and bowel sounds are normal. She exhibits no distension.  There is no tenderness. There is no rigidity, no rebound, no guarding, no tenderness at McBurney's point and negative Murphy's sign.  Musculoskeletal: Normal range of motion.       Right lower leg: She exhibits tenderness, swelling and edema.  RLE with 2+ pitting edema, erythematous and warm to touch from mid-calf down to ankle, +Homan's sign, with baseline ROM in knee and ankle, sensation grossly intact but slightly diminished to sharp vs dull, strength at baseline. LLE with 1+ pretibial edema, no erythema or warmth, sensation and strength at baseline Distal pulses as noted above Toenails of b/l lower extremities hyperkeratotic and yellowed, skin slightly brawny appearing  Neurological: She is alert and oriented to person, place, and time. She has normal strength.  Strength and sensation as noted above  Skin: Skin is warm and dry. Abrasion noted. No rash noted. There is erythema.  Erythema  of RLE as noted above Small excoriated area just proximal to R ankle on medial/posterior aspect of leg. No weeping or drainage.  Psychiatric: She has a normal mood and affect.  Nursing note and vitals reviewed.   ED Course  Procedures (including critical care time) Labs Review Labs Reviewed  BASIC METABOLIC PANEL - Abnormal; Notable for the following:    GFR calc non Af Amer 70 (*)    GFR calc Af Amer 81 (*)    All other components within normal limits  D-DIMER, QUANTITATIVE - Abnormal; Notable for the following:    D-Dimer, Quant 2.33 (*)    All other components within normal limits  I-STAT CHEM 8, ED - Abnormal; Notable for the following:    Calcium, Ion 1.06 (*)    Hemoglobin 15.6 (*)    All other components within normal limits  CBC WITH DIFFERENTIAL  SEDIMENTATION RATE  C-REACTIVE PROTEIN  I-STAT TROPOININ, ED    Imaging Review Dg Chest 2 View  11/06/2014   CLINICAL DATA:  Chest pain. Right leg redness and swelling for 2 months.  EXAM: CHEST  2 VIEW  COMPARISON:  PA and lateral chest 09/08/2011.  FINDINGS: Heart size is upper normal. The lungs are clear. No pneumothorax or pleural effusion. Scoliosis again seen.  IMPRESSION: No acute disease.  Stable compared to prior exam.   Electronically Signed   By: Inge Rise M.D.   On: 11/06/2014 00:27     EKG Interpretation   Date/Time:  Thursday November 06 2014 00:49:19 EST Ventricular Rate:  110 PR Interval:    QRS Duration: 96 QT Interval:  365 QTC Calculation: 494 R Axis:   32 Text Interpretation:  Atrial fibrillation Nonspecific T abnormalities,  lateral leads Confirmed by [facility]  MD, APRIL ([id number]) on 11/06/2014  12:56:36 AM      MDM   Final diagnoses:  None    73y/o female with RLE swelling, erythema, pain ongoing x2-3 months as well as paresthesias. Chart review reveals that in 04/2014 she had similar symptoms and DVT study was neg at that time. DDx includes DVT, cellulitis, periph neuropathy  related to DM, stasis dermatitis. Pt noted to be in Afib w/ RVR during exam, HR <120, which seems to be new onset since chart review reveals no prior afib recordings. Will obtain labs, EKG, trop, CXR, ESR/CRP (since she's had a knee replacement in this leg), d-dimer, and give tetanus and start clindamycin for possible cellulitis. Likely will need to scan for PE, will await chem8 for Cr level prior to ordering CTA vs V/Q scan. Will likely need to admit for new  onset afib as well as DVT rule-out since vascular studies unable to be performed tonight. Discussed case with Dr. Randal Buba who saw and examined the pt with me.   12:42 AM Chem 8 WNL, d-dimer 2.33, will proceed with CTA to r/o PE. Trop neg. CBC w/diff WNL. BMP pending, ESR/CRP pending. CXR stable without acute changes. EKG pending, but Afib on the monitor and per exam. Of note, pt refused Tdap booster.  12:59 AM EKG c/w afib, which is new and changed from prior EKGs. Will proceed with admission for new onset afib and probable DVT.  1:18 AM Dr. Arnoldo Morale returning the page, would like to be re-consulted for admission after CTA is finished and pt is ready for admission. Discussed with Dr. Randal Buba who will page Dr. Arnoldo Morale once pt's exam is completed. Please see her note for further documentation of care.   Patty Sermons Aguanga, PA-C 11/06/14 0120  April K Palumbo-Rasch, MD 11/06/14 [phone]

## 2014-11-05 NOTE — ED Notes (Signed)
The pt is c/o rt lower leg pain from her toes to just below her rt knee.  She has had for 2 months intermittently

## 2014-11-06 ENCOUNTER — Emergency Department (HOSPITAL_COMMUNITY): Payer: PRIVATE HEALTH INSURANCE

## 2014-11-06 ENCOUNTER — Encounter (HOSPITAL_COMMUNITY): Payer: Self-pay

## 2014-11-06 DIAGNOSIS — Z87891 Personal history of nicotine dependence: Secondary | ICD-10-CM | POA: Diagnosis not present

## 2014-11-06 DIAGNOSIS — E119 Type 2 diabetes mellitus without complications: Secondary | ICD-10-CM

## 2014-11-06 DIAGNOSIS — R791 Abnormal coagulation profile: Secondary | ICD-10-CM

## 2014-11-06 DIAGNOSIS — I480 Paroxysmal atrial fibrillation: Secondary | ICD-10-CM

## 2014-11-06 DIAGNOSIS — J45909 Unspecified asthma, uncomplicated: Secondary | ICD-10-CM | POA: Diagnosis present

## 2014-11-06 DIAGNOSIS — L03119 Cellulitis of unspecified part of limb: Principal | ICD-10-CM

## 2014-11-06 DIAGNOSIS — I2510 Atherosclerotic heart disease of native coronary artery without angina pectoris: Secondary | ICD-10-CM | POA: Diagnosis present

## 2014-11-06 DIAGNOSIS — I10 Essential (primary) hypertension: Secondary | ICD-10-CM | POA: Diagnosis present

## 2014-11-06 DIAGNOSIS — L02419 Cutaneous abscess of limb, unspecified: Secondary | ICD-10-CM

## 2014-11-06 DIAGNOSIS — R7989 Other specified abnormal findings of blood chemistry: Secondary | ICD-10-CM | POA: Diagnosis present

## 2014-11-06 DIAGNOSIS — M79604 Pain in right leg: Secondary | ICD-10-CM | POA: Diagnosis present

## 2014-11-06 DIAGNOSIS — I4891 Unspecified atrial fibrillation: Secondary | ICD-10-CM | POA: Diagnosis present

## 2014-11-06 DIAGNOSIS — Z880 Allergy status to penicillin: Secondary | ICD-10-CM | POA: Diagnosis not present

## 2014-11-06 DIAGNOSIS — E876 Hypokalemia: Secondary | ICD-10-CM | POA: Diagnosis present

## 2014-11-06 DIAGNOSIS — R609 Edema, unspecified: Secondary | ICD-10-CM

## 2014-11-06 DIAGNOSIS — I369 Nonrheumatic tricuspid valve disorder, unspecified: Secondary | ICD-10-CM

## 2014-11-06 DIAGNOSIS — Z8673 Personal history of transient ischemic attack (TIA), and cerebral infarction without residual deficits: Secondary | ICD-10-CM | POA: Diagnosis not present

## 2014-11-06 LAB — HEPARIN LEVEL (UNFRACTIONATED): Heparin Unfractionated: 0.47 IU/mL (ref 0.30–0.70)

## 2014-11-06 LAB — I-STAT CHEM 8, ED
BUN: 23 mg/dL (ref 6–23)
Calcium, Ion: 1.06 mmol/L — ABNORMAL LOW (ref 1.13–1.30)
Chloride: 103 mEq/L (ref 96–112)
Creatinine, Ser: 0.8 mg/dL (ref 0.50–1.10)
Glucose, Bld: 98 mg/dL (ref 70–99)
HEMATOCRIT: 46 % (ref 36.0–46.0)
Hemoglobin: 15.6 g/dL — ABNORMAL HIGH (ref 12.0–15.0)
POTASSIUM: 4.5 meq/L (ref 3.7–5.3)
SODIUM: 143 meq/L (ref 137–147)
TCO2: 29 mmol/L (ref 0–100)

## 2014-11-06 LAB — BASIC METABOLIC PANEL
Anion gap: 15 (ref 5–15)
Anion gap: 15 (ref 5–15)
BUN: 14 mg/dL (ref 6–23)
BUN: 16 mg/dL (ref 6–23)
CALCIUM: 8.7 mg/dL (ref 8.4–10.5)
CO2: 23 mEq/L (ref 19–32)
CO2: 27 mEq/L (ref 19–32)
CREATININE: 0.81 mg/dL (ref 0.50–1.10)
Calcium: 9 mg/dL (ref 8.4–10.5)
Chloride: 102 mEq/L (ref 96–112)
Chloride: 105 mEq/L (ref 96–112)
Creatinine, Ser: 0.82 mg/dL (ref 0.50–1.10)
GFR calc Af Amer: 81 mL/min — ABNORMAL LOW (ref >90)
GFR calc Af Amer: 82 mL/min — ABNORMAL LOW (ref >90)
GFR calc non Af Amer: 70 mL/min — ABNORMAL LOW (ref >90)
GFR calc non Af Amer: 71 mL/min — ABNORMAL LOW (ref >90)
GLUCOSE: 150 mg/dL — AB (ref 70–99)
GLUCOSE: 95 mg/dL (ref 70–99)
POTASSIUM: 4.7 meq/L (ref 3.7–5.3)
Potassium: 3.3 mEq/L — ABNORMAL LOW (ref 3.7–5.3)
SODIUM: 144 meq/L (ref 137–147)
Sodium: 143 mEq/L (ref 137–147)

## 2014-11-06 LAB — CBC WITH DIFFERENTIAL/PLATELET
Basophils Absolute: 0 10*3/uL (ref 0.0–0.1)
Basophils Relative: 0 % (ref 0–1)
Eosinophils Absolute: 0.3 10*3/uL (ref 0.0–0.7)
Eosinophils Relative: 4 % (ref 0–5)
HCT: 42.6 % (ref 36.0–46.0)
HEMOGLOBIN: 13.2 g/dL (ref 12.0–15.0)
LYMPHS ABS: 2.6 10*3/uL (ref 0.7–4.0)
LYMPHS PCT: 37 % (ref 12–46)
MCH: 28 pg (ref 26.0–34.0)
MCHC: 31 g/dL (ref 30.0–36.0)
MCV: 90.4 fL (ref 78.0–100.0)
MONOS PCT: 8 % (ref 3–12)
Monocytes Absolute: 0.6 10*3/uL (ref 0.1–1.0)
NEUTROS PCT: 51 % (ref 43–77)
Neutro Abs: 3.6 10*3/uL (ref 1.7–7.7)
PLATELETS: 207 10*3/uL (ref 150–400)
RBC: 4.71 MIL/uL (ref 3.87–5.11)
RDW: 14.5 % (ref 11.5–15.5)
WBC: 6.9 10*3/uL (ref 4.0–10.5)

## 2014-11-06 LAB — CBC
HEMATOCRIT: 39.2 % (ref 36.0–46.0)
Hemoglobin: 12.3 g/dL (ref 12.0–15.0)
MCH: 28 pg (ref 26.0–34.0)
MCHC: 31.4 g/dL (ref 30.0–36.0)
MCV: 89.1 fL (ref 78.0–100.0)
Platelets: 172 10*3/uL (ref 150–400)
RBC: 4.4 MIL/uL (ref 3.87–5.11)
RDW: 14.4 % (ref 11.5–15.5)
WBC: 6.4 10*3/uL (ref 4.0–10.5)

## 2014-11-06 LAB — TSH: TSH: 5.83 u[IU]/mL — ABNORMAL HIGH (ref 0.350–4.500)

## 2014-11-06 LAB — D-DIMER, QUANTITATIVE (NOT AT ARMC): D-Dimer, Quant: 2.33 ug/mL-FEU — ABNORMAL HIGH (ref 0.00–0.48)

## 2014-11-06 LAB — GLUCOSE, CAPILLARY: Glucose-Capillary: 114 mg/dL — ABNORMAL HIGH (ref 70–99)

## 2014-11-06 LAB — SEDIMENTATION RATE: Sed Rate: 2 mm/hr (ref 0–22)

## 2014-11-06 LAB — C-REACTIVE PROTEIN: CRP: <0.5 mg/dL — ABNORMAL LOW (ref <0.60)

## 2014-11-06 LAB — I-STAT TROPONIN, ED: TROPONIN I, POC: 0.02 ng/mL (ref 0.00–0.08)

## 2014-11-06 LAB — T4, FREE: Free T4: 1.25 ng/dL (ref 0.80–1.80)

## 2014-11-06 MED ORDER — DULOXETINE HCL 60 MG PO CPEP
60.0000 mg | ORAL_CAPSULE | Freq: Every day | ORAL | Status: DC
  Administered 2014-11-06 – 2014-11-08 (×3): 60 mg via ORAL
  Filled 2014-11-06 (×3): qty 1

## 2014-11-06 MED ORDER — SODIUM CHLORIDE 0.9 % IV SOLN
INTRAVENOUS | Status: DC
  Administered 2014-11-06: 18:00:00 via INTRAVENOUS

## 2014-11-06 MED ORDER — HYDROMORPHONE HCL 1 MG/ML IJ SOLN
0.5000 mg | INTRAMUSCULAR | Status: DC | PRN
  Filled 2014-11-06: qty 1

## 2014-11-06 MED ORDER — CLINDAMYCIN PHOSPHATE 600 MG/50ML IV SOLN
600.0000 mg | Freq: Once | INTRAVENOUS | Status: AC
  Administered 2014-11-06: 600 mg via INTRAVENOUS
  Filled 2014-11-06: qty 50

## 2014-11-06 MED ORDER — ACETAMINOPHEN 325 MG PO TABS: 650.0000 mg | ORAL_TABLET | Freq: Four times a day (QID) | ORAL | Status: DC | PRN

## 2014-11-06 MED ORDER — ALUM & MAG HYDROXIDE-SIMETH 200-200-20 MG/5ML PO SUSP: 30.0000 mL | Freq: Four times a day (QID) | ORAL | Status: DC | PRN

## 2014-11-06 MED ORDER — HEPARIN (PORCINE) IN NACL 100-0.45 UNIT/ML-% IJ SOLN
1150.0000 [IU]/h | INTRAMUSCULAR | Status: DC
  Administered 2014-11-06: 1150 [IU]/h via INTRAVENOUS
  Administered 2014-11-06: 1000 [IU]/h via INTRAVENOUS
  Filled 2014-11-06 (×3): qty 250

## 2014-11-06 MED ORDER — PANTOPRAZOLE SODIUM 40 MG PO TBEC
40.0000 mg | DELAYED_RELEASE_TABLET | Freq: Every day | ORAL | Status: DC
  Administered 2014-11-06 – 2014-11-08 (×3): 40 mg via ORAL
  Filled 2014-11-06 (×2): qty 1

## 2014-11-06 MED ORDER — TETANUS-DIPHTH-ACELL PERTUSSIS 5-2.5-18.5 LF-MCG/0.5 IM SUSP
0.5000 mL | Freq: Once | INTRAMUSCULAR | Status: DC
  Filled 2014-11-06: qty 0.5

## 2014-11-06 MED ORDER — SIMVASTATIN 20 MG PO TABS
20.0000 mg | ORAL_TABLET | Freq: Every day | ORAL | Status: DC
  Administered 2014-11-06: 20 mg via ORAL
  Filled 2014-11-06 (×2): qty 1

## 2014-11-06 MED ORDER — INSULIN ASPART 100 UNIT/ML ~~LOC~~ SOLN: 0.0000 [IU] | Freq: Every day | SUBCUTANEOUS | Status: DC

## 2014-11-06 MED ORDER — IOHEXOL 350 MG/ML SOLN
100.0000 mL | Freq: Once | INTRAVENOUS | Status: AC | PRN
  Administered 2014-11-06: 100 mL via INTRAVENOUS

## 2014-11-06 MED ORDER — FUROSEMIDE 40 MG PO TABS: 40.0000 mg | ORAL_TABLET | Freq: Every day | ORAL | Status: DC | PRN

## 2014-11-06 MED ORDER — ONDANSETRON HCL 4 MG/2ML IJ SOLN: 4.0000 mg | Freq: Four times a day (QID) | INTRAMUSCULAR | Status: DC | PRN

## 2014-11-06 MED ORDER — POTASSIUM CHLORIDE CRYS ER 20 MEQ PO TBCR
40.0000 meq | EXTENDED_RELEASE_TABLET | Freq: Once | ORAL | Status: AC
  Administered 2014-11-06: 40 meq via ORAL
  Filled 2014-11-06: qty 2

## 2014-11-06 MED ORDER — LISINOPRIL 10 MG PO TABS
10.0000 mg | ORAL_TABLET | Freq: Every day | ORAL | Status: DC
  Administered 2014-11-06 – 2014-11-07 (×2): 10 mg via ORAL
  Filled 2014-11-06 (×2): qty 1

## 2014-11-06 MED ORDER — OXYCODONE HCL 5 MG PO TABS
5.0000 mg | ORAL_TABLET | ORAL | Status: DC | PRN
  Administered 2014-11-06 – 2014-11-08 (×6): 5 mg via ORAL
  Filled 2014-11-06 (×6): qty 1

## 2014-11-06 MED ORDER — ALBUTEROL SULFATE (2.5 MG/3ML) 0.083% IN NEBU: 2.5000 mg | INHALATION_SOLUTION | Freq: Four times a day (QID) | RESPIRATORY_TRACT | Status: DC | PRN

## 2014-11-06 MED ORDER — LORATADINE 10 MG PO TABS
10.0000 mg | ORAL_TABLET | Freq: Every day | ORAL | Status: DC
  Administered 2014-11-06 – 2014-11-08 (×3): 10 mg via ORAL
  Filled 2014-11-06 (×3): qty 1

## 2014-11-06 MED ORDER — CLONAZEPAM 0.5 MG PO TABS: 0.5000 mg | ORAL_TABLET | Freq: Two times a day (BID) | ORAL | Status: DC | PRN

## 2014-11-06 MED ORDER — DILTIAZEM HCL 30 MG PO TABS
30.0000 mg | ORAL_TABLET | Freq: Three times a day (TID) | ORAL | Status: DC
  Administered 2014-11-06 – 2014-11-08 (×7): 30 mg via ORAL
  Filled 2014-11-06 (×9): qty 1

## 2014-11-06 MED ORDER — ACETAMINOPHEN 650 MG RE SUPP: 650.0000 mg | Freq: Four times a day (QID) | RECTAL | Status: DC | PRN

## 2014-11-06 MED ORDER — ATENOLOL 50 MG PO TABS
75.0000 mg | ORAL_TABLET | Freq: Every day | ORAL | Status: DC
  Administered 2014-11-06: 75 mg via ORAL
  Filled 2014-11-06: qty 1

## 2014-11-06 MED ORDER — ONDANSETRON HCL 4 MG PO TABS
4.0000 mg | ORAL_TABLET | Freq: Four times a day (QID) | ORAL | Status: DC | PRN
  Filled 2014-11-06: qty 1

## 2014-11-06 MED ORDER — INSULIN ASPART 100 UNIT/ML ~~LOC~~ SOLN
0.0000 [IU] | Freq: Three times a day (TID) | SUBCUTANEOUS | Status: DC
  Administered 2014-11-07: 2 [IU] via SUBCUTANEOUS

## 2014-11-06 MED ORDER — ALLOPURINOL 100 MG PO TABS
200.0000 mg | ORAL_TABLET | Freq: Every day | ORAL | Status: DC
  Administered 2014-11-06 – 2014-11-08 (×3): 200 mg via ORAL
  Filled 2014-11-06 (×3): qty 2

## 2014-11-06 MED ORDER — SODIUM CHLORIDE 0.9 % IJ SOLN
3.0000 mL | Freq: Two times a day (BID) | INTRAMUSCULAR | Status: DC
  Administered 2014-11-08: 3 mL via INTRAVENOUS

## 2014-11-06 MED ORDER — METOPROLOL TARTRATE 1 MG/ML IV SOLN: 2.5000 mg | Freq: Four times a day (QID) | INTRAVENOUS | Status: DC | PRN

## 2014-11-06 MED ORDER — HEPARIN BOLUS VIA INFUSION
4500.0000 [IU] | Freq: Once | INTRAVENOUS | Status: AC
  Filled 2014-11-06: qty 4500

## 2014-11-06 MED ORDER — CLINDAMYCIN PHOSPHATE 600 MG/50ML IV SOLN
600.0000 mg | Freq: Three times a day (TID) | INTRAVENOUS | Status: DC
  Administered 2014-11-06 – 2014-11-08 (×7): 600 mg via INTRAVENOUS
  Filled 2014-11-06 (×9): qty 50

## 2014-11-06 NOTE — Progress Notes (Signed)
*  PRELIMINARY RESULTS* Echocardiogram 2D Echocardiogram has been performed.  [name] 11/06/2014, 10:54 AM

## 2014-11-06 NOTE — Progress Notes (Signed)
ANTICOAGULATION CONSULT NOTE - Initial Consult  Pharmacy Consult for Heparin  Indication: atrial fibrillation  Allergies  Allergen Reactions  . Penicillins Other (See Comments)    unknown   Patient Measurements: Height: 5\' 4"  (162.6 cm) Weight: 254 lb (115.214 kg) IBW/kg (Calculated) : 54.7 Heparin Dosing Weight: ~82 kg  Vital Signs: Temp: 97.7 F (36.5 C) (11/11 2305) Temp Source: Oral (11/11 2305) BP: 135/95 mmHg (11/12 0215) Pulse Rate: 97 (11/12 0130)  Labs:  Recent Labs  11/06/14 11/06/14 0016  HGB 13.2 15.6*  HCT 42.6 46.0  PLT 207  --   CREATININE 0.82 0.80    Estimated Creatinine Clearance: 79.2 mL/min (by C-G formula based on Cr of 0.8).   Medical History: Past Medical History  Diagnosis Date  . Stroke   . Acute MI   . Asthma   . Diabetes mellitus    Assessment: 73 y/o F to start heparin per pharmacy for new onset afib and r/o DVT, CT angio is negative for PE. CBC good, renal function good, other labs as above.   Goal of Therapy:  Heparin level 0.3-0.7 units/ml Monitor platelets by anticoagulation protocol: Yes   Plan:  -Heparin 4500 units BOLUS -Start heparin drip at 1000 units/hr -1000 HL -Daily CBC/HL -Monitor for bleeding  Narda Bonds 11/06/2014,2:27 AM

## 2014-11-06 NOTE — ED Notes (Signed)
Patient off the unit for testing.

## 2014-11-06 NOTE — Progress Notes (Signed)
VASCULAR LAB PRELIMINARY  PRELIMINARY  PRELIMINARY  PRELIMINARY  Right lower extremity venous Doppler completed.    Preliminary report:  There is no DVT or SVT noted in the right lower extremity.   Vint Pola, RVT 11/06/2014, 11:06 AM

## 2014-11-06 NOTE — ED Notes (Signed)
Clarified with Dr. Arnoldo Morale, patient will receive clindamycin at 0800, and patient is allowed to eat at this time. Dr. Arnoldo Morale is at the bedside to see the patient.

## 2014-11-06 NOTE — H&P (Signed)
[facility] Admission History and Physical       [name] KPT:[id number] DOB: 05-29-1941 DOA: 11/05/2014  Referring physician: EDP PCP: Philis Fendt, MD  Specialists:   Chief Complaint: Pain Redness and Swelling of Right Lower Leg  HPI: [name] is a 73 y.o. female with a history of CAD, HTN, DM2, Asthma, and previous CVA who presents to the ED with complaints of increased pain redness and swelling of the right lower leg over 3 months.  She denies fevers and chills.  She was found to have atrial fibrillation while in the ED, and had an elevated D-Dimer of 2.33 so a CTA of the Chest was performed and found to be negative for PE, and Aortic Dissection.  She was started on IV heparin and IV clindamycin and referred for admission.       Review of Systems:  Constitutional: No Weight Loss, No Weight Gain, Night Sweats, Fevers, Chills, Dizziness, Fatigue, or Generalized Weakness HEENT: No Headaches, Difficulty Swallowing,Tooth/Dental Problems,Sore Throat,  No Sneezing, Rhinitis, Ear Ache, Nasal Congestion, or Post Nasal Drip,  Cardio-vascular:  No Chest pain, Orthopnea, PND, Edema in Lower Extremities, Anasarca, Dizziness, Palpitations  Resp: No Dyspnea, No DOE, No Productive Cough, No Non-Productive Cough, No Hemoptysis, No Wheezing.    GI: No Heartburn, Indigestion, Abdominal Pain, Nausea, Vomiting, Diarrhea, Hematemesis, Hematochezia, Melena, Change in Bowel Habits,  Loss of Appetite  GU: No Dysuria, Change in Color of Urine, No Urgency or Frequency, No Flank pain.  Musculoskeletal: +Right Leg Pain and Swelling, No Decreased Range of Motion, No Back Pain.  Neurologic: No Syncope, No Seizures, Muscle Weakness, Paresthesia, Vision Disturbance or Loss, No Diplopia, No Vertigo, No Difficulty Walking,  Skin: No Rash or Lesions. Psych: No Change in Mood or Affect, No Depression or Anxiety, No Memory loss, No Confusion, or Hallucinations   Past Medical History   Diagnosis Date  . Stroke   . Acute MI   . Asthma   . Diabetes mellitus     Past Surgical History  Procedure Laterality Date  . Joint replacement    . Back surgery       Prior to Admission medications   Medication Sig Start Date End Date Taking? Authorizing Provider  albuterol (VENTOLIN HFA) 108 (90 BASE) MCG/ACT inhaler Inhale 2 puffs into the lungs every 6 (six) hours as needed for wheezing or shortness of breath (SOB).   Yes Historical Provider, MD  allopurinol (ZYLOPRIM) 100 MG tablet Take 200 mg by mouth daily.   Yes Historical Provider, MD  atenolol (TENORMIN) 50 MG tablet Take 75 mg by mouth daily.    Yes Historical Provider, MD  DULoxetine (CYMBALTA) 60 MG capsule Take 60 mg by mouth daily.   Yes Historical Provider, MD  Fluticasone-Salmeterol (ADVAIR) 100-50 MCG/DOSE AEPB Inhale 1 puff into the lungs 2 (two) times daily.   Yes Historical Provider, MD  furosemide (LASIX) 40 MG tablet Take 40 mg by mouth daily as needed (leg swelling).   Yes Historical Provider, MD  lisinopril (PRINIVIL,ZESTRIL) 10 MG tablet Take 10 mg by mouth daily.   Yes Historical Provider, MD  loratadine (CLARITIN) 10 MG tablet Take 10 mg by mouth daily.   Yes Historical Provider, MD  metFORMIN (GLUCOPHAGE) 500 MG tablet Take 500 mg by mouth 2 (two) times daily with a meal.   Yes Historical Provider, MD  omeprazole (PRILOSEC) 20 MG capsule Take 20 mg by mouth daily.   Yes Historical Provider, MD  simvastatin (ZOCOR) 20 MG tablet  Take 20 mg by mouth daily.   Yes Historical Provider, MD  carbamide peroxide (DEBROX) 6.5 % otic solution Place 5 drops into both ears 2 (two) times daily.    Historical Provider, MD  clonazePAM (KLONOPIN) 0.5 MG tablet Take 0.5 mg by mouth 2 (two) times daily as needed for anxiety (anxiety).    Historical Provider, MD  Menthol, Topical Analgesic, (BIOFREEZE) 4 % GEL Apply 1 application topically as needed (muscle pain).    Historical Provider, MD     Allergies  Allergen  Reactions  . Penicillins Other (See Comments)    unknown    Social History:  reports that she has quit smoking. She has never used smokeless tobacco. She reports that she uses illicit drugs. She reports that she does not drink alcohol.     Family History  Problem Relation Age of Onset  . Cancer Brother       Physical Exam:  GEN:  Pleasant Obese 73 y.o. African American female examined  and in no acute distress; cooperative with exam Filed Vitals:   11/06/14 0116 11/06/14 0119 11/06/14 0130 11/06/14 0215  BP: 135/94 135/94 110/59 135/95  Pulse: 113 113 97   Temp:      TempSrc:      Resp: 22 15 15 17   Height:      Weight:      SpO2: 100% 100% 99% 98%   Blood pressure 135/95, pulse 97, temperature 97.7 F (36.5 C), temperature source Oral, resp. rate 17, height 5\' 4"  (1.626 m), weight 115.214 kg (254 lb), SpO2 98 %. PSYCH: She is alert and oriented x4; does not appear anxious does not appear depressed; affect is normal HEENT: Normocephalic and Atraumatic, Mucous membranes pink; PERRLA; EOM intact; Fundi:  Benign;  No scleral icterus, Nares: Patent, Oropharynx: Clear, Fair Dentition,    Neck:  FROM, No Cervical Lymphadenopathy nor Thyromegaly or Carotid Bruit; No JVD; Breasts:: Not examined CHEST WALL: No tenderness CHEST: Normal respiration, clear to auscultation bilaterally HEART: Regular rate and rhythm; no murmurs rubs or gallops BACK: No kyphosis or scoliosis; No CVA tenderness ABDOMEN: Positive Bowel Sounds, Obese, Soft Non-Tender; No Masses, No Organomegaly, No Pannus; No Intertriginous candida. Rectal Exam: Not done EXTREMITIES: No Cyanosis, Clubbing, or Edema; No Ulcerations. Genitalia: not examined PULSES: 2+ and symmetric SKIN: Normal hydration no rash or ulceration CNS:  Alert and Oriented x 4, Nonfocal Deficits Vascular: pulses palpable throughout    Labs on Admission:  Basic Metabolic Panel:  Recent Labs Lab 11/06/14 11/06/14 0016  NA 144 143  K 4.7 4.5   CL 102 103  CO2 27  --   GLUCOSE 95 98  BUN 16 23  CREATININE 0.82 0.80  CALCIUM 9.0  --    Liver Function Tests: No results for input(s): AST, ALT, ALKPHOS, BILITOT, PROT, ALBUMIN in the last 168 hours. No results for input(s): LIPASE, AMYLASE in the last 168 hours. No results for input(s): AMMONIA in the last 168 hours. CBC:  Recent Labs Lab 11/06/14 11/06/14 0016  WBC 6.9  --   NEUTROABS 3.6  --   HGB 13.2 15.6*  HCT 42.6 46.0  MCV 90.4  --   PLT 207  --    Cardiac Enzymes: No results for input(s): CKTOTAL, CKMB, CKMBINDEX, TROPONINI in the last 168 hours.  BNP (last 3 results) No results for input(s): PROBNP in the last 8760 hours. CBG: No results for input(s): GLUCAP in the last 168 hours.  Radiological Exams on Admission: Dg Chest  2 View  11/06/2014   CLINICAL DATA:  Chest pain. Right leg redness and swelling for 2 months.  EXAM: CHEST  2 VIEW  COMPARISON:  PA and lateral chest 09/08/2011.  FINDINGS: Heart size is upper normal. The lungs are clear. No pneumothorax or pleural effusion. Scoliosis again seen.  IMPRESSION: No acute disease.  Stable compared to prior exam.   Electronically Signed   By: Inge Rise M.D.   On: 11/06/2014 00:27   Ct Angio Chest Pe W/cm &/or Wo Cm  11/06/2014   CLINICAL DATA:  Elevated D-dimer.  Right lower extremity swelling.  EXAM: CT ANGIOGRAPHY CHEST WITH CONTRAST  TECHNIQUE: Multidetector CT imaging of the chest was performed using the standard protocol during bolus administration of intravenous contrast. Multiplanar CT image reconstructions and MIPs were obtained to evaluate the vascular anatomy.  CONTRAST:  100 mL OMNIPAQUE IOHEXOL 350 MG/ML SOLN  COMPARISON:  PA and lateral chest earlier this same day and 09/08/2011.  FINDINGS: No pulmonary embolus is identified. There is cardiomegaly. Calcific aortic and coronary atherosclerosis is identified. No pleural or pericardial effusion. Two small pleural calcifications are seen posteriorly  on the right. No pleural or pericardial effusion is identified. There is no axillary, hilar or mediastinal lymphadenopathy. The lungs demonstrate mild dependent atelectatic change but are otherwise clear. Visualized upper abdomen shows no focal abnormality. Mesh from hernia repair is noted. No lytic or sclerotic bony lesion is seen.  Review of the MIP images confirms the above findings.  IMPRESSION: Negative for pulmonary embolus or acute disease.  Cardiomegaly.  Calcific coronary artery disease.   Electronically Signed   By: Inge Rise M.D.   On: 11/06/2014 01:26     EKG: Independently reviewed. Atrial fibrillation  Rate = 110   Assessment/Plan:   73 y.o. female with   Principal Problem:   1.    Atrial fibrillation   IV Heparin drip   IV Lopressor PRN  Active Problems:   2.   Cellulitis and abscess of leg   IV Clindamycin   RLE [place] in AM     3.   Elevated d-dimer   RLE [place] in AM     4.   Diabetes mellitus type 2, controlled, without complications   Hold Metformin   SSI Coverage PRN   Check HbA1C     5.   CAD (coronary artery disease), native coronary artery   On Atenolol, Lisinopril, and Simvastatin       6.   Essential hypertension   Continue Lisinopril,      7.   Asthma in adult without complication   Albuterol Nebs       8.   DVT Prophylaxis   IV Heparin      Code Status:   FULL CODE Family Communication:   No Family Problem  Disposition Plan:    Inpatient    Time spent:  [place] [facility] Pager [phone]   If [place] Please Contact the Day Rounding Team MD for [facility]  If 7PM-7AM, Please Contact Night-Floor Coverage  [email or web address] Password [email or web address] 11/06/2014, 2:51 AM

## 2014-11-06 NOTE — ED Notes (Signed)
Pt reports right leg swelling for two months off and on. Skin noted to be red and cool to touch, obvious swelling noted.

## 2014-11-06 NOTE — Care Management Note (Unsigned)
    Page 1 of 1   11/07/2014     5:03:48 PM CARE MANAGEMENT NOTE 11/07/2014  Patient:  [name], [name]   Account Number:  [id number]  Date Initiated:  11/06/2014  Documentation initiated by:  Eugenio Dollins  Subjective/Objective Assessment:   Pt adm on 11/05/14 with Afib and cellulitis.  PTA, pt independent, lives alone.     Action/Plan:   CM referral to check coverage for NOACS.   Anticipated DC Date:  11/07/2014   Anticipated DC Plan:  [place]  CM consult  Medication Assistance      Choice offered to / List presented to:             Status of service:  Completed, signed off Medicare Important Message given?   (If response is "NO", the following Medicare IM given date fields will be blank) Date Medicare IM given:   Medicare IM given by:   Date Additional Medicare IM given:   Additional Medicare IM given by:    Discharge Disposition:    Per UR Regulation:  Reviewed for med. necessity/level of care/duration of stay  If discussed at [place] of Stay Meetings, dates discussed:    Comments:   11/07/14 Ellan Lambert, RN, BSN [phone] pt given 30 day free trial card for Eliquis and notified of copay.  11/06/14 Ellan Lambert, RN, BSN [phone] Eliquis/Xarelto coverage as follows:  per [employer] rx:  eliquis: tier 3; auth required [phone];  $3.60  xarelto: tier 3; auth required [id number]; $3.60

## 2014-11-06 NOTE — ED Notes (Signed)
Meal provided 

## 2014-11-06 NOTE — ED Notes (Signed)
Patient requesting food. Called Dr. Randal Buba to report, no meals until CTA results. Discussed plan of care with patient.

## 2014-11-06 NOTE — Progress Notes (Signed)
Utilization Review Completed.Donne Anon T11/11/2014

## 2014-11-06 NOTE — Progress Notes (Signed)
PROGRESS NOTE  [name] VEH:[id number] DOB: April 22, 1941 DOA: 11/05/2014 PCP: Philis Fendt, MD  Assessment/Plan: Atrial fibrillation -discussed with Dr. Percival Spanish- EKG shows a fib -will start cardizem PO -on heparin gtt, will start oral anticoagulation- has history of vaginal bleeding in past so have to watch carefully -TSH  Increased,  echo pending   Cellulitis and abscess of leg IV Clindamycin RLE [place] negative   Elevated d-dimer CTA negative   Diabetes mellitus type 2, controlled, without complications Hold Metformin SSI Coverage PRN Check HbA1C   CAD (coronary artery disease), native coronary artery On Atenolol, Lisinopril, and Simvastatin   Essential hypertension   Asthma in adult without complication Albuterol Nebs   Increased TSH -check free t4  Hypokalemia -replete   Code Status: full Family Communication: patient Disposition Plan:    Consultants:  none  Procedures:      HPI/Subjective: Still with right calf pain  Objective: Filed Vitals:   11/06/14 0526  BP: 140/96  Pulse: 118  Temp: 98 F (36.7 C)  Resp: 18    Intake/Output Summary (Last 24 hours) at 11/06/14 1311 Last data filed at 11/06/14 0730  Gross per 24 hour  Intake    360 ml  Output      0 ml  Net    360 ml   Filed Weights   11/05/14 2305 11/06/14 0330  Weight: 115.214 kg (254 lb) 103.511 kg (228 lb 3.2 oz)    Exam:   General:  A+Ox3, NAD  Cardiovascular: irr  Respiratory: clear  Abdomen: +BS, soft  Musculoskeletal: right leg with swelling and tightness   Data Reviewed: Basic Metabolic Panel:  Recent Labs Lab 11/06/14 11/06/14 0016 11/06/14 0530  NA 144 143 143  K 4.7 4.5 3.3*  CL 102 103 105  CO2  27  --  23  GLUCOSE 95 98 150*  BUN 16 23 14   CREATININE 0.82 0.80 0.81  CALCIUM 9.0  --  8.7   Liver Function Tests: No results for input(s): AST, ALT, ALKPHOS, BILITOT, PROT, ALBUMIN in the last 168 hours. No results for input(s): LIPASE, AMYLASE in the last 168 hours. No results for input(s): AMMONIA in the last 168 hours. CBC:  Recent Labs Lab 11/06/14 11/06/14 0016 11/06/14 0530  WBC 6.9  --  6.4  NEUTROABS 3.6  --   --   HGB 13.2 15.6* 12.3  HCT 42.6 46.0 39.2  MCV 90.4  --  89.1  PLT 207  --  172   Cardiac Enzymes: No results for input(s): CKTOTAL, CKMB, CKMBINDEX, TROPONINI in the last 168 hours. BNP (last 3 results) No results for input(s): PROBNP in the last 8760 hours. CBG: No results for input(s): GLUCAP in the last 168 hours.  No results found for this or any previous visit (from the past 240 hour(s)).   Studies: Dg Chest 2 View  11/06/2014   CLINICAL DATA:  Chest pain. Right leg redness and swelling for 2 months.  EXAM: CHEST  2 VIEW  COMPARISON:  PA and lateral chest 09/08/2011.  FINDINGS: Heart size is upper normal. The lungs are clear. No pneumothorax or pleural effusion. Scoliosis again seen.  IMPRESSION: No acute disease.  Stable compared to prior exam.   Electronically Signed   By: Inge Rise M.D.   On: 11/06/2014 00:27   Ct Angio Chest Pe W/cm &/or Wo Cm  11/06/2014   CLINICAL DATA:  Elevated D-dimer.  Right lower extremity swelling.  EXAM: CT ANGIOGRAPHY CHEST WITH CONTRAST  TECHNIQUE:  Multidetector CT imaging of the chest was performed using the standard protocol during bolus administration of intravenous contrast. Multiplanar CT image reconstructions and MIPs were obtained to evaluate the vascular anatomy.  CONTRAST:  100 mL OMNIPAQUE IOHEXOL 350 MG/ML SOLN  COMPARISON:  PA and lateral chest earlier this same day and 09/08/2011.  FINDINGS: No pulmonary embolus is identified. There is cardiomegaly. Calcific aortic and coronary atherosclerosis is  identified. No pleural or pericardial effusion. Two small pleural calcifications are seen posteriorly on the right. No pleural or pericardial effusion is identified. There is no axillary, hilar or mediastinal lymphadenopathy. The lungs demonstrate mild dependent atelectatic change but are otherwise clear. Visualized upper abdomen shows no focal abnormality. Mesh from hernia repair is noted. No lytic or sclerotic bony lesion is seen.  Review of the MIP images confirms the above findings.  IMPRESSION: Negative for pulmonary embolus or acute disease.  Cardiomegaly.  Calcific coronary artery disease.   Electronically Signed   By: Inge Rise M.D.   On: 11/06/2014 01:26    Scheduled Meds: . allopurinol  200 mg Oral Daily  . clindamycin (CLEOCIN) IV  600 mg Intravenous 3 times per day  . diltiazem  30 mg Oral 3 times per day  . DULoxetine  60 mg Oral Daily  . lisinopril  10 mg Oral Daily  . loratadine  10 mg Oral Daily  . pantoprazole  40 mg Oral Daily  . simvastatin  20 mg Oral Daily  . sodium chloride  3 mL Intravenous Q12H  . Tdap  0.5 mL Intramuscular Once   Continuous Infusions: . sodium chloride 50 mL/hr at 11/06/14 0525  . heparin 1,150 Units/hr (11/06/14 1117)   Antibiotics Given (last 72 hours)    Date/Time Action Medication Dose Rate   11/06/14 0809 Given   clindamycin (CLEOCIN) IVPB 600 mg 600 mg 100 mL/hr      Principal Problem:   Atrial fibrillation Active Problems:   Cellulitis and abscess of leg   Elevated d-dimer   Diabetes mellitus type 2, controlled, without complications   CAD (coronary artery disease), native coronary artery   Essential hypertension   Asthma in adult without complication    Time spent: 25 min    Thelton Graca  [facility] Pager [phone] If 7PM-7AM, please contact night-coverage at [email or web address], password [facility] 11/06/2014, 1:11 PM  LOS: 1 day

## 2014-11-06 NOTE — ED Notes (Signed)
Attempted to call report

## 2014-11-06 NOTE — Progress Notes (Signed)
[facility] for Heparin  Indication: atrial fibrillation  Allergies  Allergen Reactions  . Penicillins Other (See Comments)    unknown   Patient Measurements: Height: 5\' 4"  (162.6 cm) Weight: 228 lb 3.2 oz (103.511 kg) IBW/kg (Calculated) : 54.7 Heparin Dosing Weight: ~82 kg  Vital Signs: Temp: 98 F (36.7 C) (11/12 0526) Temp Source: Oral (11/12 0526) BP: 140/96 mmHg (11/12 0526) Pulse Rate: 118 (11/12 0526)  Labs:  Recent Labs  11/06/14 11/06/14 0016 11/06/14 0530 11/06/14 0950  HGB 13.2 15.6* 12.3  --   HCT 42.6 46.0 39.2  --   PLT 207  --  172  --   HEPARINUNFRC  --   --   --  0.47  CREATININE 0.82 0.80 0.81  --     Estimated Creatinine Clearance: 73.5 mL/min (by C-G formula based on Cr of 0.81).   Medical History: Past Medical History  Diagnosis Date  . Stroke   . Acute MI   . Asthma   . Diabetes mellitus    Assessment: 73 y/o F to start heparin per pharmacy for new onset afib  Dopplers negative for DVT CT angio negative for PE  HL = 0.47  Goal of Therapy:  Heparin level 0.3-0.7 units/ml Monitor platelets by anticoagulation protocol: Yes   Plan:  Increase heparin to 1150 units / hr Follow up AM labs Monitor for bleeding  Thank you Anette Guarneri, PharmD [phone]  Tad Moore 11/06/2014,11:15 AM

## 2014-11-07 LAB — COMPREHENSIVE METABOLIC PANEL
ALK PHOS: 91 U/L (ref 39–117)
ALT: 10 U/L (ref 0–35)
ANION GAP: 10 (ref 5–15)
AST: 16 U/L (ref 0–37)
Albumin: 3 g/dL — ABNORMAL LOW (ref 3.5–5.2)
BUN: 16 mg/dL (ref 6–23)
CO2: 26 meq/L (ref 19–32)
Calcium: 9 mg/dL (ref 8.4–10.5)
Chloride: 105 mEq/L (ref 96–112)
Creatinine, Ser: 0.87 mg/dL (ref 0.50–1.10)
GFR, EST AFRICAN AMERICAN: 75 mL/min — AB (ref >90)
GFR, EST NON AFRICAN AMERICAN: 65 mL/min — AB (ref >90)
GLUCOSE: 112 mg/dL — AB (ref 70–99)
POTASSIUM: 4.1 meq/L (ref 3.7–5.3)
SODIUM: 141 meq/L (ref 137–147)
Total Bilirubin: 0.2 mg/dL — ABNORMAL LOW (ref 0.3–1.2)
Total Protein: 5.7 g/dL — ABNORMAL LOW (ref 6.0–8.3)

## 2014-11-07 LAB — CBC
HEMATOCRIT: 35.4 % — AB (ref 36.0–46.0)
HEMOGLOBIN: 10.7 g/dL — AB (ref 12.0–15.0)
MCH: 27.1 pg (ref 26.0–34.0)
MCHC: 30.2 g/dL (ref 30.0–36.0)
MCV: 89.6 fL (ref 78.0–100.0)
Platelets: 156 10*3/uL (ref 150–400)
RBC: 3.95 MIL/uL (ref 3.87–5.11)
RDW: 14.6 % (ref 11.5–15.5)
WBC: 4.9 10*3/uL (ref 4.0–10.5)

## 2014-11-07 LAB — GLUCOSE, CAPILLARY
GLUCOSE-CAPILLARY: 101 mg/dL — AB (ref 70–99)
GLUCOSE-CAPILLARY: 102 mg/dL — AB (ref 70–99)
GLUCOSE-CAPILLARY: 112 mg/dL — AB (ref 70–99)
Glucose-Capillary: 126 mg/dL — ABNORMAL HIGH (ref 70–99)

## 2014-11-07 LAB — HEPARIN LEVEL (UNFRACTIONATED): Heparin Unfractionated: 0.69 IU/mL (ref 0.30–0.70)

## 2014-11-07 MED ORDER — MENTHOL 3 MG MT LOZG
1.0000 | LOZENGE | OROMUCOSAL | Status: DC | PRN
  Administered 2014-11-08: 3 mg via ORAL
  Filled 2014-11-07 (×2): qty 9

## 2014-11-07 MED ORDER — LISINOPRIL 5 MG PO TABS
5.0000 mg | ORAL_TABLET | Freq: Every day | ORAL | Status: DC
  Administered 2014-11-08: 5 mg via ORAL
  Filled 2014-11-07: qty 1

## 2014-11-07 MED ORDER — APIXABAN 5 MG PO TABS
5.0000 mg | ORAL_TABLET | Freq: Two times a day (BID) | ORAL | Status: DC
  Administered 2014-11-07 – 2014-11-08 (×3): 5 mg via ORAL
  Filled 2014-11-07 (×4): qty 1

## 2014-11-07 MED ORDER — HEPARIN (PORCINE) IN NACL 100-0.45 UNIT/ML-% IJ SOLN
10.0000 [IU]/kg/h | INTRAMUSCULAR | Status: AC
  Filled 2014-11-07: qty 250

## 2014-11-07 MED ORDER — GUAIFENESIN-DM 100-10 MG/5ML PO SYRP
5.0000 mL | ORAL_SOLUTION | ORAL | Status: DC | PRN
  Administered 2014-11-08: 5 mL via ORAL
  Filled 2014-11-07: qty 5

## 2014-11-07 MED ORDER — PRAVASTATIN SODIUM 40 MG PO TABS
40.0000 mg | ORAL_TABLET | Freq: Every day | ORAL | Status: DC
  Administered 2014-11-07: 40 mg via ORAL
  Filled 2014-11-07 (×3): qty 1

## 2014-11-07 NOTE — Progress Notes (Signed)
PROGRESS NOTE  [name] DSK:[id number] DOB: 05/22/1941 DOA: 11/05/2014 PCP: Philis Fendt, MD  Assessment/Plan: Atrial fibrillation -discussed with Dr. Percival Spanish- EKG shows a fib- upon further discussion, sisters say she has had a fib in past and has been on coumadin with Dr. Terrence Dupont -will start cardizem PO -on heparin gtt, will start oral anticoagulation- has history of vaginal bleeding in past so have to watch carefully -TSH  Increased,  echo ok   Cellulitis and abscess of leg IV Clindamycin RLE [place] negative   Elevated d-dimer CTA negative   Diabetes mellitus type 2, controlled, without complications Hold Metformin SSI Coverage PRN Check HbA1C   CAD (coronary artery disease), native coronary artery On Atenolol, Lisinopril, and Simvastatin   Essential hypertension   Asthma in adult without complication Albuterol Nebs   Increased TSH - free t4 ok  Hypokalemia -replete   Code Status: full Family Communication: patient/sisters Disposition Plan:    Consultants:  none  Procedures:      HPI/Subjective: Leg with less swelling  Objective: Filed Vitals:   11/07/14 1002  BP: 114/58  Pulse:   Temp:   Resp:     Intake/Output Summary (Last 24 hours) at 11/07/14 1156 Last data filed at 11/07/14 0810  Gross per 24 hour  Intake    720 ml  Output      0 ml  Net    720 ml   Filed Weights   11/05/14 2305 11/06/14 0330  Weight: 115.214 kg (254 lb) 103.511 kg (228 lb 3.2 oz)    Exam:   General:  A+Ox3, NAD  Cardiovascular: irr  Respiratory: clear  Abdomen: +BS, soft  Musculoskeletal: right leg with swelling and tightness   Data Reviewed: Basic Metabolic Panel:  Recent Labs Lab 11/06/14  11/06/14 0016 11/06/14 0530 11/07/14 0358  NA 144 143 143 141  K 4.7 4.5 3.3* 4.1  CL 102 103 105 105  CO2 27  --  23 26  GLUCOSE 95 98 150* 112*  BUN 16 23 14 16   CREATININE 0.82 0.80 0.81 0.87  CALCIUM 9.0  --  8.7 9.0   Liver Function Tests:  Recent Labs Lab 11/07/14 0358  AST 16  ALT 10  ALKPHOS 91  BILITOT 0.2*  PROT 5.7*  ALBUMIN 3.0*   No results for input(s): LIPASE, AMYLASE in the last 168 hours. No results for input(s): AMMONIA in the last 168 hours. CBC:  Recent Labs Lab 11/06/14 11/06/14 0016 11/06/14 0530 11/07/14 0358  WBC 6.9  --  6.4 4.9  NEUTROABS 3.6  --   --   --   HGB 13.2 15.6* 12.3 10.7*  HCT 42.6 46.0 39.2 35.4*  MCV 90.4  --  89.1 89.6  PLT 207  --  172 156   Cardiac Enzymes: No results for input(s): CKTOTAL, CKMB, CKMBINDEX, TROPONINI in the last 168 hours. BNP (last 3 results) No results for input(s): PROBNP in the last 8760 hours. CBG:  Recent Labs Lab 11/06/14 2123 11/07/14 0601 11/07/14 1114  GLUCAP 114* 126* 101*    No results found for this or any previous visit (from the past 240 hour(s)).   Studies: Dg Chest 2 View  11/06/2014   CLINICAL DATA:  Chest pain. Right leg redness and swelling for 2 months.  EXAM: CHEST  2 VIEW  COMPARISON:  PA and lateral chest 09/08/2011.  FINDINGS: Heart size is upper normal. The lungs are clear. No pneumothorax or pleural effusion. Scoliosis again seen.  IMPRESSION: No acute disease.  Stable compared to prior exam.   Electronically Signed   By: Inge Rise M.D.   On: 11/06/2014 00:27   Ct Angio Chest Pe W/cm &/or Wo Cm  11/06/2014   CLINICAL DATA:  Elevated D-dimer.  Right lower extremity swelling.  EXAM: CT ANGIOGRAPHY CHEST WITH CONTRAST  TECHNIQUE: Multidetector CT imaging of the chest was performed using the standard protocol during bolus administration of intravenous contrast. Multiplanar CT image reconstructions and MIPs were obtained to evaluate the vascular anatomy.  CONTRAST:   100 mL OMNIPAQUE IOHEXOL 350 MG/ML SOLN  COMPARISON:  PA and lateral chest earlier this same day and 09/08/2011.  FINDINGS: No pulmonary embolus is identified. There is cardiomegaly. Calcific aortic and coronary atherosclerosis is identified. No pleural or pericardial effusion. Two small pleural calcifications are seen posteriorly on the right. No pleural or pericardial effusion is identified. There is no axillary, hilar or mediastinal lymphadenopathy. The lungs demonstrate mild dependent atelectatic change but are otherwise clear. Visualized upper abdomen shows no focal abnormality. Mesh from hernia repair is noted. No lytic or sclerotic bony lesion is seen.  Review of the MIP images confirms the above findings.  IMPRESSION: Negative for pulmonary embolus or acute disease.  Cardiomegaly.  Calcific coronary artery disease.   Electronically Signed   By: Inge Rise M.D.   On: 11/06/2014 01:26    Scheduled Meds: . allopurinol  200 mg Oral Daily  . clindamycin (CLEOCIN) IV  600 mg Intravenous 3 times per day  . diltiazem  30 mg Oral 3 times per day  . DULoxetine  60 mg Oral Daily  . insulin aspart  0-15 Units Subcutaneous TID WC  . insulin aspart  0-5 Units Subcutaneous QHS  . lisinopril  10 mg Oral Daily  . loratadine  10 mg Oral Daily  . pantoprazole  40 mg Oral Daily  . pravastatin  40 mg Oral q1800  . sodium chloride  3 mL Intravenous Q12H  . Tdap  0.5 mL Intramuscular Once   Continuous Infusions: . heparin 1,150 Units/hr (11/06/14 2221)   Antibiotics Given (last 72 hours)    Date/Time Action Medication Dose Rate   11/06/14 0809 Given   clindamycin (CLEOCIN) IVPB 600 mg 600 mg 100 mL/hr   11/06/14 1456 Given   clindamycin (CLEOCIN) IVPB 600 mg 600 mg 100 mL/hr   11/06/14 2221 Given   clindamycin (CLEOCIN) IVPB 600 mg 600 mg 100 mL/hr   11/07/14 0540 Given   clindamycin (CLEOCIN) IVPB 600 mg 600 mg 100 mL/hr      Principal Problem:   Atrial fibrillation Active Problems:    Cellulitis and abscess of leg   Elevated d-dimer   Diabetes mellitus type 2, controlled, without complications   CAD (coronary artery disease), native coronary artery   Essential hypertension   Asthma in adult without complication    Time spent: 25 min    [name] [name]  [facility] Pager [phone] If 7PM-7AM, please contact night-coverage at [email or web address], password [facility] 11/07/2014, 11:56 AM  LOS: 2 days

## 2014-11-07 NOTE — Plan of Care (Signed)
Problem: Phase II Progression Outcomes Goal: Obtain order to discontinue catheter if appropriate Outcome: Not Applicable Date Met:  11/07/14     

## 2014-11-07 NOTE — Discharge Instructions (Signed)

## 2014-11-07 NOTE — Progress Notes (Addendum)
ANTICOAGULATION Follow Up NOTE   Pharmacy Consult for Heparin  Indication: atrial fibrillation  Allergies  Allergen Reactions  . Penicillins Other (See Comments)    unknown   Patient Measurements: Height: 5\' 4"  (162.6 cm) Weight: 228 lb 3.2 oz (103.511 kg) IBW/kg (Calculated) : 54.7 Heparin Dosing Weight: ~82 kg  Vital Signs: Temp: 98.1 F (36.7 C) (11/13 0437) Temp Source: Oral (11/13 0437) BP: 116/55 mmHg (11/13 0539) Pulse Rate: 67 (11/13 0437)  Labs:  Recent Labs  11/06/14 11/06/14 0016 11/06/14 0530 11/06/14 0950 11/07/14 0358  HGB 13.2 15.6* 12.3  --  10.7*  HCT 42.6 46.0 39.2  --  35.4*  PLT 207  --  172  --  156  HEPARINUNFRC  --   --   --  0.47 0.69  CREATININE 0.82 0.80 0.81  --  0.87    Estimated Creatinine Clearance: 68.5 mL/min (by C-G formula based on Cr of 0.87).   Medical History: Past Medical History  Diagnosis Date  . Stroke   . Acute MI   . Asthma   . Diabetes mellitus    Assessment: 73 y/o F admitted 11/05/2014 for new onset afib and r/o DVT, CT angio is negative for PE. Doppler negative for DVT.  Pharmacy consulted to dose heparin > & switching to apixaban on 11/13  Anticoagulation: Afib,  H/H trend down, PLT stable, HL therapeutic Heparin @1150 /h  Infectious Disease: Cellulitis, afeb, wbc wnl Clindamycin 11/12>  Cardiovascular: CAD, HTN, Afib HR not controlled, BP elevated Atenolol, lisinopril, Zocor (LFTs?)  Endocrinology: DM, gout glucose ok, TSH 5.8 high (Free t4 wnl), A1c fup Allopurinol, SSI  Neurology: Previous CVA Nephrology: Scr ok, K low (replaced) PTA Medication Issues: Metformin Best Practices: Heparin  Goal of Therapy:  Heparin level 0.3-0.7 units/ml Monitor platelets by anticoagulation protocol: Yes   Plan:  Long term AC, plan xarelto or apixaban, follow up start Switching statin to pravastain to avoid drug interaction with diltiazem, please discharge patient on pravastatin if diltiazem to continue as  outpatient.    Thank you for allowing pharmacy to be a part of this patients care team.  Rowe Robert Pharm.D., BCPS, AQ-Cardiology Clinical Pharmacist 11/07/2014 8:51 AM Pager: [phone] Phone: [phone]  1:59 PM At [place] heparin, start Apixaban, Follow periodic CBC

## 2014-11-07 NOTE — Plan of Care (Signed)
Problem: Phase II Progression Outcomes Goal: Progress activity as tolerated unless otherwise ordered Outcome: Completed/Met Date Met:  11/07/14     

## 2014-11-08 LAB — CBC
HCT: 40.8 % (ref 36.0–46.0)
Hemoglobin: 12.8 g/dL (ref 12.0–15.0)
MCH: 28.2 pg (ref 26.0–34.0)
MCHC: 31.4 g/dL (ref 30.0–36.0)
MCV: 89.9 fL (ref 78.0–100.0)
Platelets: 180 10*3/uL (ref 150–400)
RBC: 4.54 MIL/uL (ref 3.87–5.11)
RDW: 14.6 % (ref 11.5–15.5)
WBC: 5.5 10*3/uL (ref 4.0–10.5)

## 2014-11-08 LAB — GLUCOSE, CAPILLARY
GLUCOSE-CAPILLARY: 97 mg/dL (ref 70–99)
GLUCOSE-CAPILLARY: 105 mg/dL — AB (ref 70–99)

## 2014-11-08 LAB — BASIC METABOLIC PANEL
ANION GAP: 13 (ref 5–15)
BUN: 15 mg/dL (ref 6–23)
CHLORIDE: 101 meq/L (ref 96–112)
CO2: 26 mEq/L (ref 19–32)
Calcium: 9.5 mg/dL (ref 8.4–10.5)
Creatinine, Ser: 0.8 mg/dL (ref 0.50–1.10)
GFR calc non Af Amer: 72 mL/min — ABNORMAL LOW (ref >90)
GFR, EST AFRICAN AMERICAN: 83 mL/min — AB (ref >90)
Glucose, Bld: 122 mg/dL — ABNORMAL HIGH (ref 70–99)
POTASSIUM: 4.2 meq/L (ref 3.7–5.3)
Sodium: 140 mEq/L (ref 137–147)

## 2014-11-08 MED ORDER — APIXABAN 5 MG PO TABS: 5.0000 mg | ORAL_TABLET | Freq: Two times a day (BID) | ORAL | Status: DC

## 2014-11-08 MED ORDER — OXYCODONE HCL 5 MG PO TABS: 5.0000 mg | ORAL_TABLET | Freq: Four times a day (QID) | ORAL | Status: DC | PRN

## 2014-11-08 MED ORDER — CLONAZEPAM 0.5 MG PO TABS: 0.5000 mg | ORAL_TABLET | Freq: Two times a day (BID) | ORAL | Status: DC | PRN

## 2014-11-08 MED ORDER — DILTIAZEM HCL 30 MG PO TABS: 30.0000 mg | ORAL_TABLET | Freq: Three times a day (TID) | ORAL | Status: DC

## 2014-11-08 MED ORDER — PRAVASTATIN SODIUM 40 MG PO TABS: 40.0000 mg | ORAL_TABLET | Freq: Every day | ORAL | Status: DC

## 2014-11-08 MED ORDER — LISINOPRIL 5 MG PO TABS: 5.0000 mg | ORAL_TABLET | Freq: Every day | ORAL | Status: DC

## 2014-11-08 MED ORDER — CLINDAMYCIN HCL 300 MG PO CAPS: 300.0000 mg | ORAL_CAPSULE | Freq: Three times a day (TID) | ORAL | Status: DC

## 2014-11-08 MED ORDER — OXYCODONE HCL 5 MG PO TABS
5.0000 mg | ORAL_TABLET | Freq: Four times a day (QID) | ORAL | Status: DC | PRN
  Administered 2014-11-08: 5 mg via ORAL
  Filled 2014-11-08: qty 1

## 2014-11-08 MED ORDER — CLINDAMYCIN HCL 300 MG PO CAPS
300.0000 mg | ORAL_CAPSULE | Freq: Three times a day (TID) | ORAL | Status: DC
  Administered 2014-11-08: 300 mg via ORAL
  Filled 2014-11-08 (×3): qty 1

## 2014-11-08 NOTE — Plan of Care (Signed)
Problem: Phase II Progression Outcomes Goal: Discharge plan established Outcome: Completed/Met Date Met:  11/08/14 Goal: Vital signs remain stable Outcome: Completed/Met Date Met:  11/08/14     

## 2014-11-08 NOTE — Discharge Summary (Addendum)
Physician Discharge Summary  [name] HRC:[id number] DOB: 1941/09/04 DOA: 11/05/2014  PCP: Philis Fendt, MD  Admit date: 11/05/2014 Discharge date: 11/08/2014  Time spent: 35 minutes  Recommendations for Outpatient Follow-up:  Needs to elevate legs when not walking Wear TED hose when up walking -repeat TSH  Discharge Diagnoses:  Principal Problem:   Atrial fibrillation Active Problems:   Cellulitis and abscess of leg   Elevated d-dimer   Diabetes mellitus type 2, controlled, without complications   CAD (coronary artery disease), native coronary artery   Essential hypertension   Asthma in adult without complication   Discharge Condition: improved  Diet recommendation: cardiac/diabetic  Filed Weights   11/05/14 2305 11/06/14 0330  Weight: 115.214 kg (254 lb) 103.511 kg (228 lb 3.2 oz)    History of present illness:  [name] is a 73 y.o. female with a history of CAD, HTN, DM2, Asthma, and previous CVA who presents to the ED with complaints of increased pain redness and swelling of the right lower leg over 3 months. She denies fevers and chills. She was found to have atrial fibrillation while in the ED, and had an elevated D-Dimer of 2.33 so a CTA of the Chest was performed and found to be negative for PE, and Aortic Dissection. She was started on IV heparin and IV clindamycin and referred for admission.  Hospital Course:  Atrial fibrillation -discussed with Dr. Percival Spanish- EKG shows a fib- upon further discussion, sisters say she has had a fib in past and has been on coumadin with Dr. Terrence Dupont -will start cardizem PO -tolerated NOAC -TSH Increased, echo ok   Cellulitis and abscess of leg clindamycin   Needs to elevate legs   Needs to wear TED hose RLE [place] negative   Elevated d-dimer CTA negative   Diabetes mellitus type 2, controlled, without  complications Hold Metformin SSI Coverage PRN Check HbA1C   CAD (coronary artery disease), native coronary artery On Atenolol, Lisinopril, and Simvastatin   Essential hypertension   Asthma in adult without complication Albuterol Nebs   Increased TSH - free t4 ok  Hypokalemia -replete   At discharge, patient states that she just came to the hospital cause she was out of her "nerve pill" and pain medications- short term scripts given  Procedures:  echo  Consultations:    Discharge Exam: Filed Vitals:   11/08/14 0513  BP: 137/85  Pulse: 92  Temp: 98 F (36.7 C)  Resp: 18    General: A+Ox3, NAD Cardiovascular: rrr Respiratory: clear  Discharge Instructions You were cared for by a hospitalist during your hospital stay. If you have any questions about your discharge medications or the care you received while you were in the hospital after you are discharged, you can call the unit and asked to speak with the hospitalist on call if the hospitalist that took care of you is not available. Once you are discharged, your primary care physician will handle any further medical issues. Please note that NO REFILLS for any discharge medications will be authorized once you are discharged, as it is imperative that you return to your primary care physician (or establish a relationship with a primary care physician if you do not have one) for your aftercare needs so that they can reassess your need for medications and monitor your lab values.  Discharge Instructions    Diet - low sodium heart healthy    Complete by:  As directed      Diet Carb Modified  Complete by:  As directed      Discharge instructions    Complete by:  As directed   Elevate legs Wear TED hose when up and around Need to establish with PCP      Increase activity slowly    Complete by:  As directed           Current Discharge Medication List    START taking these medications   Details  apixaban (ELIQUIS) 5 MG TABS tablet Take 1 tablet (5 mg total) by mouth 2 (two) times daily. Qty: 60 tablet, Refills: 0    clindamycin (CLEOCIN) 300 MG capsule Take 1 capsule (300 mg total) by mouth every 8 (eight) hours. Qty: 10 capsule, Refills: 0    diltiazem (CARDIZEM) 30 MG tablet Take 1 tablet (30 mg total) by mouth every 8 (eight) hours. Qty: 90 tablet, Refills: 0    oxyCODONE (OXY IR/ROXICODONE) 5 MG immediate release tablet Take 1 tablet (5 mg total) by mouth every 6 (six) hours as needed for moderate pain. Qty: 30 tablet, Refills: 0    pravastatin (PRAVACHOL) 40 MG tablet Take 1 tablet (40 mg total) by mouth daily at 6 PM. Qty: 30 tablet, Refills: 0      CONTINUE these medications which have CHANGED   Details  lisinopril (PRINIVIL,ZESTRIL) 5 MG tablet Take 1 tablet (5 mg total) by mouth daily. Qty: 30 tablet, Refills: 0      CONTINUE these medications which have NOT CHANGED   Details  albuterol (VENTOLIN HFA) 108 (90 BASE) MCG/ACT inhaler Inhale 2 puffs into the lungs every 6 (six) hours as needed for wheezing or shortness of breath (SOB).    allopurinol (ZYLOPRIM) 100 MG tablet Take 200 mg by mouth daily.    DULoxetine (CYMBALTA) 60 MG capsule Take 60 mg by mouth daily.    Fluticasone-Salmeterol (ADVAIR) 100-50 MCG/DOSE AEPB Inhale 1 puff into the lungs 2 (two) times daily.    furosemide (LASIX) 40 MG tablet Take 40 mg by mouth daily as needed (leg swelling).    loratadine (CLARITIN) 10 MG tablet Take 10 mg by mouth daily.    metFORMIN (GLUCOPHAGE) 500 MG tablet Take 500 mg by mouth 2 (two) times daily with a meal.    omeprazole (PRILOSEC) 20 MG capsule Take 20 mg by mouth daily.    carbamide peroxide (DEBROX) 6.5 % otic solution Place 5 drops into both ears 2 (two) times daily.    clonazePAM (KLONOPIN) 0.5 MG  tablet Take 0.5 mg by mouth 2 (two) times daily as needed for anxiety (anxiety).      STOP taking these medications     atenolol (TENORMIN) 50 MG tablet      simvastatin (ZOCOR) 20 MG tablet      Menthol, Topical Analgesic, (BIOFREEZE) 4 % GEL        Allergies  Allergen Reactions  . Penicillins Other (See Comments)    unknown   Follow-up Information    Follow up with Clent Demark, MD In 2 weeks.   Specialty:  Cardiology   Contact information:   [address] [place] [address] [phone]        The results of significant diagnostics from this hospitalization (including imaging, microbiology, ancillary and laboratory) are listed below for reference.    Significant Diagnostic Studies: Dg Chest 2 View  11/06/2014   CLINICAL DATA:  Chest pain. Right leg redness and swelling for 2 months.  EXAM: CHEST  2 VIEW  COMPARISON:  PA and lateral chest 09/08/2011.  FINDINGS: Heart size is upper normal. The lungs are clear. No pneumothorax or pleural effusion. Scoliosis again seen.  IMPRESSION: No acute disease.  Stable compared to prior exam.   Electronically Signed   By: Inge Rise M.D.   On: 11/06/2014 00:27   Ct Angio Chest Pe W/cm &/or Wo Cm  11/06/2014   CLINICAL DATA:  Elevated D-dimer.  Right lower extremity swelling.  EXAM: CT ANGIOGRAPHY CHEST WITH CONTRAST  TECHNIQUE: Multidetector CT imaging of the chest was performed using the standard protocol during bolus administration of intravenous contrast. Multiplanar CT image reconstructions and MIPs were obtained to evaluate the vascular anatomy.  CONTRAST:  100 mL OMNIPAQUE IOHEXOL 350 MG/ML SOLN  COMPARISON:  PA and lateral chest earlier this same day and 09/08/2011.  FINDINGS: No pulmonary embolus is identified. There is cardiomegaly. Calcific aortic and coronary atherosclerosis is identified. No pleural or pericardial effusion. Two small pleural calcifications are seen posteriorly on the right. No  pleural or pericardial effusion is identified. There is no axillary, hilar or mediastinal lymphadenopathy. The lungs demonstrate mild dependent atelectatic change but are otherwise clear. Visualized upper abdomen shows no focal abnormality. Mesh from hernia repair is noted. No lytic or sclerotic bony lesion is seen.  Review of the MIP images confirms the above findings.  IMPRESSION: Negative for pulmonary embolus or acute disease.  Cardiomegaly.  Calcific coronary artery disease.   Electronically Signed   By: Inge Rise M.D.   On: 11/06/2014 01:26    Microbiology: No results found for this or any previous visit (from the past 240 hour(s)).   Labs: Basic Metabolic Panel:  Recent Labs Lab 11/06/14 11/06/14 0016 11/06/14 0530 11/07/14 0358 11/08/14 0920  NA 144 143 143 141 140  K 4.7 4.5 3.3* 4.1 4.2  CL 102 103 105 105 101  CO2 27  --  23 26 26   GLUCOSE 95 98 150* 112* 122*  BUN 16 23 14 16 15   CREATININE 0.82 0.80 0.81 0.87 0.80  CALCIUM 9.0  --  8.7 9.0 9.5   Liver Function Tests:  Recent Labs Lab 11/07/14 0358  AST 16  ALT 10  ALKPHOS 91  BILITOT 0.2*  PROT 5.7*  ALBUMIN 3.0*   No results for input(s): LIPASE, AMYLASE in the last 168 hours. No results for input(s): AMMONIA in the last 168 hours. CBC:  Recent Labs Lab 11/06/14 11/06/14 0016 11/06/14 0530 11/07/14 0358 11/08/14 0920  WBC 6.9  --  6.4 4.9 5.5  NEUTROABS 3.6  --   --   --   --   HGB 13.2 15.6* 12.3 10.7* 12.8  HCT 42.6 46.0 39.2 35.4* 40.8  MCV 90.4  --  89.1 89.6 89.9  PLT 207  --  172 156 180   Cardiac Enzymes: No results for input(s): CKTOTAL, CKMB, CKMBINDEX, TROPONINI in the last 168 hours. BNP: BNP (last 3 results) No results for input(s): PROBNP in the last 8760 hours. CBG:  Recent Labs Lab 11/07/14 1114 11/07/14 1612 11/07/14 2124 11/08/14 0634 11/08/14 1126  GLUCAP 101* 102* 112* 97 105*       Signed:  Robi Dewolfe  [facility] 11/08/2014, 12:58  PM

## 2014-11-08 NOTE — Progress Notes (Signed)
Pt D/C'd home.  Alert and oriented x4.  Pt still has c/o chronic leg pain.  Pt was given prescriptions, but became upset about only getting 30 oxycodone and no prescription for her clonazepam.  She stated she came to the hospital since she had run out of her medications and we can't send her without her nerve pills because she won't be able to sleep.  I stressed the importance of the pt finding a new PCP since she says she doesn't wish to be seen by her current PCP.  Pt stated she would call new PCP on Monday.  I notified Dr. [name] and a small quantity of clonazepam was prescribed for the pt until she gets a new PCP. Pt was also given instructions on diet, activity, meds, and follow-up care and appointments.  Pt verbalized understanding.  IV D/C'd.  Tele D/C'd. Pt's family will pick her up later this afternoon.

## 2014-11-08 NOTE — Progress Notes (Signed)
Patient converted into A-fib. Strip saved in EPIC. VSS. Will continue to monitor.   Domingo Dimes, RN, BSN

## 2014-11-11 ENCOUNTER — Ambulatory Visit (INDEPENDENT_AMBULATORY_CARE_PROVIDER_SITE_OTHER): Payer: 59 | Admitting: Obstetrics

## 2014-11-11 ENCOUNTER — Encounter: Payer: Self-pay | Admitting: Obstetrics

## 2014-11-11 VITALS — Temp 97.0°F | Ht 64.0 in | Wt 231.0 lb

## 2014-11-11 DIAGNOSIS — N760 Acute vaginitis: Secondary | ICD-10-CM | POA: Diagnosis not present

## 2014-11-11 DIAGNOSIS — A499 Bacterial infection, unspecified: Secondary | ICD-10-CM | POA: Diagnosis not present

## 2014-11-11 DIAGNOSIS — R300 Dysuria: Secondary | ICD-10-CM

## 2014-11-11 DIAGNOSIS — N644 Mastodynia: Secondary | ICD-10-CM | POA: Diagnosis not present

## 2014-11-11 DIAGNOSIS — B373 Candidiasis of vulva and vagina: Secondary | ICD-10-CM

## 2014-11-11 DIAGNOSIS — B9689 Other specified bacterial agents as the cause of diseases classified elsewhere: Secondary | ICD-10-CM

## 2014-11-11 DIAGNOSIS — B3731 Acute candidiasis of vulva and vagina: Secondary | ICD-10-CM

## 2014-11-11 DIAGNOSIS — R350 Frequency of micturition: Secondary | ICD-10-CM | POA: Diagnosis not present

## 2014-11-11 LAB — POCT URINALYSIS DIPSTICK
Bilirubin, UA: NEGATIVE
GLUCOSE UA: NEGATIVE
Ketones, UA: NEGATIVE
Leukocytes, UA: NEGATIVE
NITRITE UA: NEGATIVE
PH UA: 5
PROTEIN UA: NEGATIVE
RBC UA: NEGATIVE
SPEC GRAV UA: 1.02
UROBILINOGEN UA: NEGATIVE

## 2014-11-11 MED ORDER — TERCONAZOLE 0.4 % VA CREA: 1.0000 | TOPICAL_CREAM | Freq: Every day | VAGINAL | Status: DC

## 2014-11-11 NOTE — Progress Notes (Addendum)
Patient ID: [name], female   DOB: 12/16/1941, 73 y.o.   MRN: [id number]  Chief Complaint  Patient presents with  . Vaginitis    C/o of burning in her vagina    HPI [name] is a 73 y.o. female.  Burning in vagina.  HPI  Past Medical History  Diagnosis Date  . Stroke   . Acute MI   . Asthma   . Diabetes mellitus   . Arthritis   . Gout     Past Surgical History  Procedure Laterality Date  . Joint replacement    . Back surgery      Family History  Problem Relation Age of Onset  . Cancer Brother     Social History History  Substance Use Topics  . Smoking status: Former Research scientist (life sciences)  . Smokeless tobacco: Never Used  . Alcohol Use: 0.0 oz/week    0 Not specified per week     Comment: Occ.    Allergies  Allergen Reactions  . Penicillins Other (See Comments)    unknown    Current Outpatient Prescriptions  Medication Sig Dispense Refill  . albuterol (VENTOLIN HFA) 108 (90 BASE) MCG/ACT inhaler Inhale 2 puffs into the lungs every 6 (six) hours as needed for wheezing or shortness of breath (SOB).    Marland Kitchen allopurinol (ZYLOPRIM) 100 MG tablet Take 200 mg by mouth daily.    Marland Kitchen apixaban (ELIQUIS) 5 MG TABS tablet Take 1 tablet (5 mg total) by mouth 2 (two) times daily. 60 tablet 0  . carbamide peroxide (DEBROX) 6.5 % otic solution Place 5 drops into both ears 2 (two) times daily.    . clindamycin (CLEOCIN) 300 MG capsule Take 1 capsule (300 mg total) by mouth every 8 (eight) hours. 10 capsule 0  . clonazePAM (KLONOPIN) 0.5 MG tablet Take 1 tablet (0.5 mg total) by mouth 2 (two) times daily as needed for anxiety (anxiety). 10 tablet 0  . diltiazem (CARDIZEM) 30 MG tablet Take 1 tablet (30 mg total) by mouth every 8 (eight) hours. 90 tablet 0  . DULoxetine (CYMBALTA) 60 MG capsule Take 60 mg by mouth daily.    . Fluticasone-Salmeterol (ADVAIR) 100-50 MCG/DOSE AEPB Inhale 1 puff into the lungs 2 (two) times daily.    . furosemide (LASIX) 40 MG tablet Take 40  mg by mouth daily as needed (leg swelling).    Marland Kitchen lisinopril (PRINIVIL,ZESTRIL) 5 MG tablet Take 1 tablet (5 mg total) by mouth daily. 30 tablet 0  . loratadine (CLARITIN) 10 MG tablet Take 10 mg by mouth daily.    . metFORMIN (GLUCOPHAGE) 500 MG tablet Take 500 mg by mouth 2 (two) times daily with a meal.    . omeprazole (PRILOSEC) 20 MG capsule Take 20 mg by mouth daily.    Marland Kitchen oxyCODONE (OXY IR/ROXICODONE) 5 MG immediate release tablet Take 1 tablet (5 mg total) by mouth every 6 (six) hours as needed for moderate pain. 30 tablet 0  . pravastatin (PRAVACHOL) 40 MG tablet Take 1 tablet (40 mg total) by mouth daily at 6 PM. 30 tablet 0  . terconazole (TERAZOL 7) 0.4 % vaginal cream Place 1 applicator vaginally at bedtime. 45 g 0   No current facility-administered medications for this visit.    Review of Systems Review of Systems Constitutional: negative for fatigue and weight loss Respiratory: negative for cough and wheezing Cardiovascular: negative for chest pain, fatigue and palpitations Gastrointestinal: negative for abdominal pain and change in bowel habits Genitourinary: vaginal  burning Integument/breast: negative for nipple discharge Musculoskeletal:negative for myalgias Neurological: negative for gait problems and tremors Behavioral/Psych: negative for abusive relationship, depression Endocrine: negative for temperature intolerance     Temperature 97 F (36.1 C), height 5\' 4"  (1.626 m), weight 231 lb (104.781 kg).  Physical Exam Physical Exam General:   alert  Skin:   no rash or abnormalities  Lungs:   not examined  Heart:   not examined  Breasts:   not examined  Abdomen:  normal findings: no organomegaly, soft, non-tender and no hernia  Pelvis:  External genitalia: normal general appearance Urinary system: urethral meatus normal and bladder without fullness, nontender Vaginal: normal without tenderness, induration or masses Cervix: normal appearance Adnexa: normal  bimanual exam Uterus: anteverted and non-tender, normal size      Data Reviewed Labs  Assessment    Vaginitis.  Probable yeast. ( presently on Clindamycin ) Bilateral Mastodynia    Plan    Terazol 7 Rx. Urine culture sent.  Orders Placed This Encounter  Procedures  . Urine Culture  . MM Digital Diagnostic Bilat    Standing Status: Future     Number of Occurrences:      Standing Expiration Date: 01/12/2016    Order Specific Question:  Reason for Exam (SYMPTOM  OR DIAGNOSIS REQUIRED)    Answer:  Breast pain, L>R    Order Specific Question:  Preferred imaging location?    Answer:  [facility]   Meds ordered this encounter  Medications  . terconazole (TERAZOL 7) 0.4 % vaginal cream    Sig: Place 1 applicator vaginally at bedtime.    Dispense:  45 g    Refill:  0        HARPER,CHARLES A 11/11/2014, 3:16 PM

## 2014-11-11 NOTE — Addendum Note (Signed)
Addended by: Ladona Ridgel on: 11/11/2014 03:52 PM   Modules accepted: Orders

## 2014-11-12 DIAGNOSIS — B9689 Other specified bacterial agents as the cause of diseases classified elsewhere: Secondary | ICD-10-CM | POA: Insufficient documentation

## 2014-11-12 DIAGNOSIS — N760 Acute vaginitis: Secondary | ICD-10-CM

## 2014-11-12 LAB — WET PREP BY MOLECULAR PROBE
Candida species: NEGATIVE
Gardnerella vaginalis: POSITIVE — AB
TRICHOMONAS VAG: NEGATIVE

## 2014-11-12 MED ORDER — METRONIDAZOLE 0.75 % VA GEL: 1.0000 | Freq: Two times a day (BID) | VAGINAL | Status: DC

## 2014-11-12 NOTE — Addendum Note (Signed)
Addended by: Baltazar Najjar A on: 11/12/2014 01:40 PM   Modules accepted: Orders

## 2014-11-13 LAB — URINE CULTURE
Colony Count: NO GROWTH
ORGANISM ID, BACTERIA: NO GROWTH

## 2014-12-16 ENCOUNTER — Ambulatory Visit: Payer: PRIVATE HEALTH INSURANCE

## 2014-12-18 ENCOUNTER — Ambulatory Visit: Payer: PRIVATE HEALTH INSURANCE

## 2014-12-23 ENCOUNTER — Ambulatory Visit: Payer: PRIVATE HEALTH INSURANCE

## 2014-12-30 ENCOUNTER — Ambulatory Visit: Payer: Medicaid Other | Admitting: Physical Therapy

## 2015-01-07 DIAGNOSIS — Z96652 Presence of left artificial knee joint: Secondary | ICD-10-CM | POA: Diagnosis not present

## 2015-01-07 DIAGNOSIS — Z471 Aftercare following joint replacement surgery: Secondary | ICD-10-CM | POA: Diagnosis not present

## 2015-01-07 DIAGNOSIS — Z96651 Presence of right artificial knee joint: Secondary | ICD-10-CM | POA: Diagnosis not present

## 2015-01-13 ENCOUNTER — Ambulatory Visit: Payer: Medicaid Other | Attending: Orthopedic Surgery

## 2015-01-13 ENCOUNTER — Other Ambulatory Visit: Payer: Self-pay | Admitting: Internal Medicine

## 2015-01-21 ENCOUNTER — Other Ambulatory Visit: Payer: Self-pay | Admitting: Geriatric Medicine

## 2015-01-21 MED ORDER — LISINOPRIL 5 MG PO TABS: 5.0000 mg | ORAL_TABLET | Freq: Every day | ORAL | Status: DC

## 2015-01-21 MED ORDER — PRAVASTATIN SODIUM 40 MG PO TABS: 40.0000 mg | ORAL_TABLET | Freq: Every day | ORAL | Status: DC

## 2015-01-23 ENCOUNTER — Other Ambulatory Visit: Payer: Self-pay | Admitting: Geriatric Medicine

## 2015-02-09 DIAGNOSIS — I10 Essential (primary) hypertension: Secondary | ICD-10-CM | POA: Diagnosis not present

## 2015-02-10 DIAGNOSIS — I10 Essential (primary) hypertension: Secondary | ICD-10-CM | POA: Diagnosis not present

## 2015-02-11 DIAGNOSIS — I10 Essential (primary) hypertension: Secondary | ICD-10-CM | POA: Diagnosis not present

## 2015-02-12 ENCOUNTER — Other Ambulatory Visit (INDEPENDENT_AMBULATORY_CARE_PROVIDER_SITE_OTHER): Payer: Medicare Other

## 2015-02-12 ENCOUNTER — Ambulatory Visit (INDEPENDENT_AMBULATORY_CARE_PROVIDER_SITE_OTHER): Payer: Medicare Other | Admitting: Internal Medicine

## 2015-02-12 ENCOUNTER — Encounter: Payer: Self-pay | Admitting: Internal Medicine

## 2015-02-12 VITALS — BP 132/78 | HR 67 | Temp 97.9°F | Resp 18 | Ht 64.0 in | Wt 231.4 lb

## 2015-02-12 DIAGNOSIS — Z79899 Other long term (current) drug therapy: Secondary | ICD-10-CM

## 2015-02-12 DIAGNOSIS — I480 Paroxysmal atrial fibrillation: Secondary | ICD-10-CM | POA: Diagnosis not present

## 2015-02-12 DIAGNOSIS — E1141 Type 2 diabetes mellitus with diabetic mononeuropathy: Secondary | ICD-10-CM | POA: Diagnosis not present

## 2015-02-12 DIAGNOSIS — Z79891 Long term (current) use of opiate analgesic: Secondary | ICD-10-CM | POA: Diagnosis not present

## 2015-02-12 DIAGNOSIS — I10 Essential (primary) hypertension: Secondary | ICD-10-CM | POA: Diagnosis not present

## 2015-02-12 DIAGNOSIS — M1990 Unspecified osteoarthritis, unspecified site: Secondary | ICD-10-CM

## 2015-02-12 DIAGNOSIS — E119 Type 2 diabetes mellitus without complications: Secondary | ICD-10-CM | POA: Diagnosis not present

## 2015-02-12 LAB — COMPREHENSIVE METABOLIC PANEL
ALK PHOS: 88 U/L (ref 39–117)
ALT: 10 U/L (ref 0–35)
AST: 15 U/L (ref 0–37)
Albumin: 3.8 g/dL (ref 3.5–5.2)
BUN: 17 mg/dL (ref 6–23)
CO2: 34 mEq/L — ABNORMAL HIGH (ref 19–32)
Calcium: 9.5 mg/dL (ref 8.4–10.5)
Chloride: 105 mEq/L (ref 96–112)
Creatinine, Ser: 0.87 mg/dL (ref 0.40–1.20)
GFR: 82.14 mL/min (ref >60.00)
GLUCOSE: 99 mg/dL (ref 70–99)
POTASSIUM: 4.2 meq/L (ref 3.5–5.1)
SODIUM: 141 meq/L (ref 135–145)
TOTAL PROTEIN: 6.6 g/dL (ref 6.0–8.3)
Total Bilirubin: 0.5 mg/dL (ref 0.2–1.2)

## 2015-02-12 LAB — LIPID PANEL
CHOL/HDL RATIO: 3
Cholesterol: 151 mg/dL (ref 0–200)
HDL: 54 mg/dL (ref >39.00)
LDL Cholesterol: 77 mg/dL (ref 0–99)
NONHDL: 97
Triglycerides: 100 mg/dL (ref 0.0–149.0)
VLDL: 20 mg/dL (ref 0.0–40.0)

## 2015-02-12 LAB — HEMOGLOBIN A1C: Hgb A1c MFr Bld: 6.6 % — ABNORMAL HIGH (ref 4.6–6.5)

## 2015-02-12 LAB — URIC ACID: Uric Acid, Serum: 4.4 mg/dL (ref 2.4–7.0)

## 2015-02-12 NOTE — Patient Instructions (Signed)
We are going to check on your blood work today so that we can decide if we need to make any more changes to your medicines. We will also get you to request records from Dr. Terrence Dupont. We will see you back in about 2 weeks and want you to bring ALL medicines and pills from your house (you do not have to bring tylenol or other over the counter medicines). This includes inhalers and old medicines and empty bottles. We have gotten rid of the empty bottles for you today.   Your medicines: 1. Furosemide (1 pill daily as needed for fluid in legs)  2. Lisinopril 10 mg (1 pill daily for blood pressure)  3. Simvastatin 20 mg (1 pill daily for cholesterol)  4. Loratadine 10 mg (1 pill daily as needed for allergies)  5. Atenolol 50 mg (1 and 1/2 pills a day for blood pressure)  6. Metformin 500 mg (1 pill twice a day for diabetes)  7. Duloxetine 60 mg (1 pill daily for depression)  8. Allopurinol 100 mg (2 pills a day for gout, we may change based on the labs)  9. Clonazepam 0.5 mg (1 pill daily as needed for nerves, this pill can make you more likely to fall and we will have you try to limit the number of these you are taking) (they can also worsen or cause problems with memory)  We can call in refills once we have your labs and records. It may be that you should be on a blood thinner and you and Dr. Terrence Dupont may have already talked about it.

## 2015-02-12 NOTE — Progress Notes (Signed)
Pre visit review using our clinic review tool, if applicable. No additional management support is needed unless otherwise documented below in the visit note. 

## 2015-02-13 DIAGNOSIS — I10 Essential (primary) hypertension: Secondary | ICD-10-CM | POA: Diagnosis not present

## 2015-02-13 DIAGNOSIS — Z79899 Other long term (current) drug therapy: Secondary | ICD-10-CM | POA: Insufficient documentation

## 2015-02-13 NOTE — Assessment & Plan Note (Signed)
Will not give narcotics today. Check UDS. Unclear if she should be on narcotics and what other therapies she has tried.

## 2015-02-13 NOTE — Assessment & Plan Note (Signed)
Patient had many medications with her today that were duplicate and of differing strengths. Confiscated any that were not matching the description of the pill on the bottle (i.e. She had multiple bottles with medications in them that were not likely the labeled medication). Took all medications that were not the correct strength. She will return in 2 weeks for full reconciliation and reminded her to bring all bottles (as she states she has more at home) so that we can make her environment safe for her and no chance for medication error due to old or mislabeled medications. Reminded her not to move medicines around to different bottles.

## 2015-02-13 NOTE — Assessment & Plan Note (Signed)
Check HgA1c, metformin currently.

## 2015-02-13 NOTE — Assessment & Plan Note (Addendum)
Should likely be on anticoagulation due to history of stroke and will try to contact Dr. Terrence Dupont to see if he knows her medical regimen. She will return in 2 weeks with all her medications so we can complete her reconciliation.

## 2015-02-13 NOTE — Progress Notes (Signed)
   Subjective:    Patient ID: [name], female    DOB: [birth year], 74 y.o.   MRN: [id number]  HPI The patient is a new 74 YO female who is coming in for follow up of a hospitalization and for back pain. Her back pain is chronic and she has had it for years. The pain has been steady and she was doing well on oxycodone however her previous PCP cut back her medicines and she was mad so she did not go back. She was in the hospital for cellulitis of her leg (reviewed records during the visit and sent home with antibiotics which she finished). She was not on anticoagulation and was discharged with eliquis. She sees Dr. Terrence Dupont for her cardiac health however she has not followed up with him this year yet and does not have any medicine left so she is not sure she is taking it. She has many bottles with her and we sift through them today. She states that she has many more at home. Throughout the visit she repeatedly asks for her oxycodone.   PMH, [facility], social history reviewed and updated as able due to poor historian. Allergies and medications verified (medications still a little unclear)  Review of Systems  Constitutional: Positive for activity change. Negative for fever, appetite change, fatigue and unexpected weight change.  HENT: Negative.   Eyes: Negative.   Respiratory: Negative for cough, chest tightness, shortness of breath and wheezing.   Cardiovascular: Positive for leg swelling. Negative for chest pain and palpitations.  Gastrointestinal: Negative for abdominal pain, diarrhea, constipation and abdominal distention.  Genitourinary: Negative.   Musculoskeletal: Positive for myalgias, back pain, arthralgias and gait problem.  Skin: Negative for rash and wound.  Neurological: Negative for dizziness, speech difficulty, weakness and light-headedness.       Gait imbalance  Psychiatric/Behavioral: Positive for dysphoric mood and decreased concentration.      Objective:   Physical Exam    Constitutional: She appears well-developed.  Not well kempt, overweight  HENT:  Head: Normocephalic and atraumatic.  Eyes: EOM are normal.  Neck: Normal range of motion.  Cardiovascular: Normal rate.   Pulmonary/Chest: Effort normal and breath sounds normal. No respiratory distress. She has no wheezes. She has no rales.  Abdominal: Soft. Bowel sounds are normal.  Musculoskeletal: She exhibits edema and tenderness.  Mild tenderness to palpation in the lumbar region. Edema 2+ bilaterally, no signs of cellulitis or rash  Neurological: She is alert. Coordination abnormal.  Some mental confusion at times, slowing possibly.   Skin: Skin is warm and dry.  Psychiatric:  Hard to follow and poor historian.    Filed Vitals:   02/12/15 0852  BP: 132/78  Pulse: 67  Temp: 97.9 F (36.6 C)  TempSrc: Oral  Resp: 18  Height: 5\' 4"  (1.626 m)  Weight: 231 lb 6.4 oz (104.962 kg)  SpO2: 95%      Assessment & Plan:  Visit time was 60 minutes and greater than 50% of that was spent in face to face with the patient trying to sort through her medications and her history and in counseling.

## 2015-02-14 DIAGNOSIS — I10 Essential (primary) hypertension: Secondary | ICD-10-CM | POA: Diagnosis not present

## 2015-02-15 DIAGNOSIS — I10 Essential (primary) hypertension: Secondary | ICD-10-CM | POA: Diagnosis not present

## 2015-02-16 DIAGNOSIS — I10 Essential (primary) hypertension: Secondary | ICD-10-CM | POA: Diagnosis not present

## 2015-02-17 DIAGNOSIS — I10 Essential (primary) hypertension: Secondary | ICD-10-CM | POA: Diagnosis not present

## 2015-02-18 ENCOUNTER — Other Ambulatory Visit: Payer: Self-pay | Admitting: Obstetrics

## 2015-02-18 DIAGNOSIS — I10 Essential (primary) hypertension: Secondary | ICD-10-CM | POA: Diagnosis not present

## 2015-02-18 DIAGNOSIS — Z1231 Encounter for screening mammogram for malignant neoplasm of breast: Secondary | ICD-10-CM

## 2015-02-19 DIAGNOSIS — I10 Essential (primary) hypertension: Secondary | ICD-10-CM | POA: Diagnosis not present

## 2015-02-20 DIAGNOSIS — I10 Essential (primary) hypertension: Secondary | ICD-10-CM | POA: Diagnosis not present

## 2015-02-21 DIAGNOSIS — I10 Essential (primary) hypertension: Secondary | ICD-10-CM | POA: Diagnosis not present

## 2015-02-22 DIAGNOSIS — I10 Essential (primary) hypertension: Secondary | ICD-10-CM | POA: Diagnosis not present

## 2015-02-23 DIAGNOSIS — I10 Essential (primary) hypertension: Secondary | ICD-10-CM | POA: Diagnosis not present

## 2015-02-24 DIAGNOSIS — I10 Essential (primary) hypertension: Secondary | ICD-10-CM | POA: Diagnosis not present

## 2015-02-25 DIAGNOSIS — I10 Essential (primary) hypertension: Secondary | ICD-10-CM | POA: Diagnosis not present

## 2015-02-26 ENCOUNTER — Encounter: Payer: Self-pay | Admitting: Internal Medicine

## 2015-02-26 ENCOUNTER — Ambulatory Visit (INDEPENDENT_AMBULATORY_CARE_PROVIDER_SITE_OTHER): Payer: Medicare Other | Admitting: Internal Medicine

## 2015-02-26 VITALS — BP 122/70 | HR 79 | Temp 97.8°F | Resp 12 | Ht 64.0 in | Wt 228.1 lb

## 2015-02-26 DIAGNOSIS — Z79899 Other long term (current) drug therapy: Secondary | ICD-10-CM | POA: Diagnosis not present

## 2015-02-26 DIAGNOSIS — I10 Essential (primary) hypertension: Secondary | ICD-10-CM | POA: Diagnosis not present

## 2015-02-26 DIAGNOSIS — I480 Paroxysmal atrial fibrillation: Secondary | ICD-10-CM

## 2015-02-26 DIAGNOSIS — M1990 Unspecified osteoarthritis, unspecified site: Secondary | ICD-10-CM

## 2015-02-26 MED ORDER — HYDROCODONE-ACETAMINOPHEN 5-325 MG PO TABS: 1.0000 | ORAL_TABLET | Freq: Two times a day (BID) | ORAL | Status: DC | PRN

## 2015-02-26 MED ORDER — APIXABAN 5 MG PO TABS: 5.0000 mg | ORAL_TABLET | Freq: Two times a day (BID) | ORAL | Status: DC

## 2015-02-26 NOTE — Patient Instructions (Addendum)
We are going to try you on pain medicine and want to see you back in about 1 month to check on you. We also want you to go back to see Dr. Terrence Dupont.   1. We are going to start a new medicine to help keep your from getting another stroke called eliquis (apixaban). Take 1 pill in the morning and 1 pill in the evening everyday. If you are going to run out please call our office.  Blood pressure medicines: Atenolol, Lasix (furosemide), Lisinopril  Diabetes: Metformin  Mood/Pain: Duloxetine, Clonazepam (this one only for emergencies of feeling very anxious, try not to take if you do not need it)  Pain: Hydrocodone (only take it if you need it up to 2 times per day)  Cholesterol: Simvastatin  Gout: Allopurinol

## 2015-02-26 NOTE — Assessment & Plan Note (Signed)
Have narrowed and corrected her medication list. She is on minimal amount of medication at this time. Will continue to evaluate and watch for possible side effects of her regimen.

## 2015-02-26 NOTE — Assessment & Plan Note (Signed)
Now that she has brought in all her medications there are not NOACs among them. Will restart apixaban for her. She does have history of stroke and not much risk for bleeding at this time. Denies recent fall. Will forward this note to her cardiologist to see if he agrees with keeping her on NOAC.

## 2015-02-26 NOTE — Progress Notes (Signed)
Pre visit review using our clinic review tool, if applicable. No additional management support is needed unless otherwise documented below in the visit note. 

## 2015-02-26 NOTE — Assessment & Plan Note (Signed)
Will try hydrocodone 5/325 mg BID prn. See her back in 1 month for UDS.

## 2015-02-26 NOTE — Progress Notes (Signed)
   Subjective:    Patient ID: [name], female    DOB: July 04, 1941, 74 y.o.   MRN: [id number]  HPI The patient is a 74 YO female who is coming in to discuss her pain as we were unable to address it at the last visit. She has had many joint replacements over the years and has chronic arthritis in her back. She has been on mostly hydrocodone in recent years from her orthopedic while she was changing PCPs. She did well with that and was able to be functional in her daily life. She was able to move around move which helped her to have less pain. She has tried NSAIDs and tramadol both of which did not provide adequate relief to get her back to her daily routines.   Review of Systems  Constitutional: Positive for activity change. Negative for fever, appetite change, fatigue and unexpected weight change.  HENT: Negative.   Eyes: Negative.   Respiratory: Negative for cough, chest tightness, shortness of breath and wheezing.   Cardiovascular: Positive for leg swelling. Negative for chest pain and palpitations.  Gastrointestinal: Negative for abdominal pain, diarrhea, constipation and abdominal distention.  Genitourinary: Negative.   Musculoskeletal: Positive for myalgias, back pain, arthralgias and gait problem.  Skin: Negative for rash and wound.  Neurological: Negative for dizziness, speech difficulty, weakness and light-headedness.       Gait imbalance  Psychiatric/Behavioral: Positive for dysphoric mood and decreased concentration.      Objective:   Physical Exam  Constitutional: She appears well-developed.  Not well kempt, overweight  HENT:  Head: Normocephalic and atraumatic.  Eyes: EOM are normal.  Neck: Normal range of motion.  Cardiovascular: Normal rate.   Pulmonary/Chest: Effort normal and breath sounds normal. No respiratory distress. She has no wheezes. She has no rales.  Abdominal: Soft. Bowel sounds are normal.  Musculoskeletal: She exhibits edema and tenderness.    Mild tenderness to palpation in the lumbar region. Edema 2+ bilaterally, no signs of cellulitis or rash  Neurological: She is alert. Coordination abnormal.  Some slowing possibly.   Skin: Skin is warm and dry.  Psychiatric:  Hard to follow and poor historian.     Filed Vitals:   02/26/15 0920  BP: 122/70  Pulse: 79  Temp: 97.8 F (36.6 C)  TempSrc: Oral  Resp: 12  Height: 5\' 4"  (1.626 m)  Weight: 228 lb 1.9 oz (103.475 kg)  SpO2: 99%     Assessment & Plan:  Visit time 25 minutes, greater than 50% of which was spent in face to face counseling.

## 2015-02-27 DIAGNOSIS — I10 Essential (primary) hypertension: Secondary | ICD-10-CM | POA: Diagnosis not present

## 2015-02-28 DIAGNOSIS — I10 Essential (primary) hypertension: Secondary | ICD-10-CM | POA: Diagnosis not present

## 2015-03-01 DIAGNOSIS — I10 Essential (primary) hypertension: Secondary | ICD-10-CM | POA: Diagnosis not present

## 2015-03-02 DIAGNOSIS — I10 Essential (primary) hypertension: Secondary | ICD-10-CM | POA: Diagnosis not present

## 2015-03-03 ENCOUNTER — Encounter: Payer: Self-pay | Admitting: Internal Medicine

## 2015-03-03 DIAGNOSIS — I10 Essential (primary) hypertension: Secondary | ICD-10-CM | POA: Diagnosis not present

## 2015-03-04 ENCOUNTER — Ambulatory Visit (INDEPENDENT_AMBULATORY_CARE_PROVIDER_SITE_OTHER): Payer: Medicare Other | Admitting: Obstetrics

## 2015-03-04 ENCOUNTER — Encounter: Payer: Self-pay | Admitting: Obstetrics

## 2015-03-04 VITALS — BP 123/69 | HR 70 | Temp 97.3°F | Ht 62.0 in | Wt 230.0 lb

## 2015-03-04 DIAGNOSIS — N907 Vulvar cyst: Secondary | ICD-10-CM

## 2015-03-04 DIAGNOSIS — L723 Sebaceous cyst: Secondary | ICD-10-CM | POA: Diagnosis not present

## 2015-03-04 DIAGNOSIS — I10 Essential (primary) hypertension: Secondary | ICD-10-CM | POA: Diagnosis not present

## 2015-03-04 NOTE — Progress Notes (Signed)
Patient ID: [name], female   DOB: 07/12/1941, 74 y.o.   MRN: [id number]  Chief Complaint  Patient presents with  . Problem    HPI [name] is a 74 y.o. female.  Painful boil on labia. HPI  Past Medical History  Diagnosis Date  . Stroke   . Acute MI   . Asthma   . Diabetes mellitus   . Arthritis   . Gout     Past Surgical History  Procedure Laterality Date  . Joint replacement    . Back surgery      Family History  Problem Relation Age of Onset  . Cancer Brother     Social History History  Substance Use Topics  . Smoking status: Former Research scientist (life sciences)  . Smokeless tobacco: Never Used  . Alcohol Use: 0.0 oz/week    0 Standard drinks or equivalent per week     Comment: Occ.    Allergies  Allergen Reactions  . Penicillins Other (See Comments)    unknown    Current Outpatient Prescriptions  Medication Sig Dispense Refill  . albuterol (VENTOLIN HFA) 108 (90 BASE) MCG/ACT inhaler Inhale 2 puffs into the lungs every 6 (six) hours as needed for wheezing or shortness of breath (SOB).    Marland Kitchen allopurinol (ZYLOPRIM) 100 MG tablet Take 200 mg by mouth daily.    Marland Kitchen apixaban (ELIQUIS) 5 MG TABS tablet Take 1 tablet (5 mg total) by mouth 2 (two) times daily. 60 tablet 1  . atenolol (TENORMIN) 50 MG tablet 50 mg. One and one half tablets by mouth daily.    . clonazePAM (KLONOPIN) 0.5 MG tablet Take 1 tablet (0.5 mg total) by mouth 2 (two) times daily as needed for anxiety (anxiety). 10 tablet 0  . DULoxetine (CYMBALTA) 60 MG capsule Take 60 mg by mouth daily.    . Fluticasone-Salmeterol (ADVAIR) 100-50 MCG/DOSE AEPB Inhale 1 puff into the lungs 2 (two) times daily.    . furosemide (LASIX) 40 MG tablet Take 40 mg by mouth daily as needed (leg swelling).    Marland Kitchen HYDROcodone-acetaminophen (NORCO/VICODIN) 5-325 MG per tablet Take 1 tablet by mouth 2 (two) times daily as needed for moderate pain. 60 tablet 0  . lisinopril (PRINIVIL,ZESTRIL) 10 MG tablet Take 10 mg by  mouth daily.    . metFORMIN (GLUCOPHAGE) 500 MG tablet Take 500 mg by mouth 2 (two) times daily with a meal.    . omeprazole (PRILOSEC) 20 MG capsule Take 20 mg by mouth daily.    . simvastatin (ZOCOR) 20 MG tablet Take 20 mg by mouth daily.     No current facility-administered medications for this visit.    Review of Systems Review of Systems Constitutional: negative for fatigue and weight loss Respiratory: negative for cough and wheezing Cardiovascular: negative for chest pain, fatigue and palpitations Gastrointestinal: negative for abdominal pain and change in bowel habits Genitourinary: positive for painful labial boil Integument/breast: negative for nipple discharge Musculoskeletal:negative for myalgias Neurological: negative for gait problems and tremors Behavioral/Psych: negative for abusive relationship, depression Endocrine: negative for temperature intolerance     Blood pressure 123/69, pulse 70, temperature 97.3 F (36.3 C), height 5\' 2"  (1.575 m), weight 230 lb (104.327 kg).  Physical Exam Physical Exam                   Pelvis:  External genitalia: small sebaceous cyst right labia majora, nontender Urinary system: urethral meatus normal and bladder without fullness, nontender Vaginal: normal without tenderness,  induration or masses Cervix: normal appearance Adnexa: normal bimanual exam Uterus: anteverted and non-tender, normal size      Data Reviewed labs  Assessment     Sebaceous cyst of right labia.     Plan    Epsom salts sitz baths bid. F/U prn  No orders of the defined types were placed in this encounter.   No orders of the defined types were placed in this encounter.

## 2015-03-05 DIAGNOSIS — I10 Essential (primary) hypertension: Secondary | ICD-10-CM | POA: Diagnosis not present

## 2015-03-06 DIAGNOSIS — I10 Essential (primary) hypertension: Secondary | ICD-10-CM | POA: Diagnosis not present

## 2015-03-07 DIAGNOSIS — I10 Essential (primary) hypertension: Secondary | ICD-10-CM | POA: Diagnosis not present

## 2015-03-08 DIAGNOSIS — I10 Essential (primary) hypertension: Secondary | ICD-10-CM | POA: Diagnosis not present

## 2015-03-11 ENCOUNTER — Telehealth: Payer: Self-pay | Admitting: Internal Medicine

## 2015-03-11 NOTE — Telephone Encounter (Signed)
Patient would like to know if you had a chance to call home health care. Please advise

## 2015-03-16 ENCOUNTER — Ambulatory Visit (HOSPITAL_COMMUNITY)
Admission: RE | Admit: 2015-03-16 | Discharge: 2015-03-16 | Disposition: A | Discharge status: Home / Self Care | Payer: Medicare Other | Source: Ambulatory Visit | Attending: Obstetrics | Admitting: Obstetrics

## 2015-03-16 DIAGNOSIS — I10 Essential (primary) hypertension: Secondary | ICD-10-CM | POA: Diagnosis not present

## 2015-03-16 DIAGNOSIS — Z1231 Encounter for screening mammogram for malignant neoplasm of breast: Secondary | ICD-10-CM

## 2015-03-17 DIAGNOSIS — I10 Essential (primary) hypertension: Secondary | ICD-10-CM | POA: Diagnosis not present

## 2015-03-18 DIAGNOSIS — I10 Essential (primary) hypertension: Secondary | ICD-10-CM | POA: Diagnosis not present

## 2015-03-20 DIAGNOSIS — I10 Essential (primary) hypertension: Secondary | ICD-10-CM | POA: Diagnosis not present

## 2015-03-22 DIAGNOSIS — I10 Essential (primary) hypertension: Secondary | ICD-10-CM | POA: Diagnosis not present

## 2015-03-23 DIAGNOSIS — I10 Essential (primary) hypertension: Secondary | ICD-10-CM | POA: Diagnosis not present

## 2015-03-24 DIAGNOSIS — I10 Essential (primary) hypertension: Secondary | ICD-10-CM | POA: Diagnosis not present

## 2015-03-24 NOTE — Telephone Encounter (Signed)
Pt called in and would like to get her home care supplys,  [facility] in [place].     Best number is [phone]

## 2015-03-24 NOTE — Telephone Encounter (Signed)
Patient says she needs extra large pull ups from [place] in [facility].

## 2015-03-24 NOTE — Telephone Encounter (Signed)
Waiting on forms from [facility] to be signed and faxed back.

## 2015-03-25 DIAGNOSIS — I10 Essential (primary) hypertension: Secondary | ICD-10-CM | POA: Diagnosis not present

## 2015-03-26 DIAGNOSIS — I10 Essential (primary) hypertension: Secondary | ICD-10-CM | POA: Diagnosis not present

## 2015-03-27 DIAGNOSIS — I10 Essential (primary) hypertension: Secondary | ICD-10-CM | POA: Diagnosis not present

## 2015-03-29 DIAGNOSIS — I10 Essential (primary) hypertension: Secondary | ICD-10-CM | POA: Diagnosis not present

## 2015-03-30 DIAGNOSIS — I10 Essential (primary) hypertension: Secondary | ICD-10-CM | POA: Diagnosis not present

## 2015-03-31 DIAGNOSIS — I10 Essential (primary) hypertension: Secondary | ICD-10-CM | POA: Diagnosis not present

## 2015-04-01 DIAGNOSIS — I10 Essential (primary) hypertension: Secondary | ICD-10-CM | POA: Diagnosis not present

## 2015-04-02 ENCOUNTER — Telehealth: Payer: Self-pay | Admitting: Geriatric Medicine

## 2015-04-02 DIAGNOSIS — I10 Essential (primary) hypertension: Secondary | ICD-10-CM | POA: Diagnosis not present

## 2015-04-02 NOTE — Telephone Encounter (Signed)
Pt called in said that form have been faxed in and she said that she needs her pull up.  Have we received these request from [facility]?

## 2015-04-02 NOTE — Telephone Encounter (Signed)
PA approved for eliquis. Patient aware.

## 2015-04-02 NOTE — Telephone Encounter (Signed)
Left voice mail on mobile phone, advised pt that [facility] should be calling me back on 4/8 in am, i will be requesting form again, we still need form from [facility] in order to get pull ups for this pt

## 2015-04-03 ENCOUNTER — Telehealth: Payer: Self-pay

## 2015-04-03 ENCOUNTER — Other Ambulatory Visit: Payer: Self-pay | Admitting: Internal Medicine

## 2015-04-03 ENCOUNTER — Ambulatory Visit: Payer: Medicare Other | Admitting: Internal Medicine

## 2015-04-03 DIAGNOSIS — Z79891 Long term (current) use of opiate analgesic: Secondary | ICD-10-CM | POA: Diagnosis not present

## 2015-04-03 MED ORDER — HYDROCODONE-ACETAMINOPHEN 5-325 MG PO TABS: 1.0000 | ORAL_TABLET | Freq: Two times a day (BID) | ORAL | Status: DC | PRN

## 2015-04-03 NOTE — Telephone Encounter (Signed)
Patient arrived late for apt and had to reschedule. Rescheduled for 4/12 at 9am with Dr. Doug Sou. Patient agreed to come in by 8:30 for AW[facility].  Call placed to [facility] to pick patient up on 4/12 for apt at [place] practice starting by 8:30. LVM; Director of Transportation called back and confirmed patient pick up by 7:30am on 4/12.

## 2015-04-04 DIAGNOSIS — I10 Essential (primary) hypertension: Secondary | ICD-10-CM | POA: Diagnosis not present

## 2015-04-05 DIAGNOSIS — I10 Essential (primary) hypertension: Secondary | ICD-10-CM | POA: Diagnosis not present

## 2015-04-07 ENCOUNTER — Encounter: Payer: Self-pay | Admitting: Internal Medicine

## 2015-04-07 ENCOUNTER — Ambulatory Visit (INDEPENDENT_AMBULATORY_CARE_PROVIDER_SITE_OTHER): Payer: Medicare Other | Admitting: Internal Medicine

## 2015-04-07 VITALS — BP 118/84 | HR 74 | Temp 97.7°F | Resp 16 | Ht 64.0 in | Wt 236.0 lb

## 2015-04-07 DIAGNOSIS — M1990 Unspecified osteoarthritis, unspecified site: Secondary | ICD-10-CM | POA: Insufficient documentation

## 2015-04-07 DIAGNOSIS — M159 Polyosteoarthritis, unspecified: Secondary | ICD-10-CM

## 2015-04-07 DIAGNOSIS — I10 Essential (primary) hypertension: Secondary | ICD-10-CM

## 2015-04-07 DIAGNOSIS — E119 Type 2 diabetes mellitus without complications: Secondary | ICD-10-CM

## 2015-04-07 DIAGNOSIS — M150 Primary generalized (osteo)arthritis: Secondary | ICD-10-CM

## 2015-04-07 DIAGNOSIS — Z0000 Encounter for general adult medical examination without abnormal findings: Secondary | ICD-10-CM

## 2015-04-07 DIAGNOSIS — J45909 Unspecified asthma, uncomplicated: Secondary | ICD-10-CM | POA: Diagnosis not present

## 2015-04-07 MED ORDER — APIXABAN 5 MG PO TABS: 5.0000 mg | ORAL_TABLET | Freq: Two times a day (BID) | ORAL | Status: DC

## 2015-04-07 NOTE — Assessment & Plan Note (Signed)
Not due for HgA1c today. Continue with metformin. On ACE-I. No complications noted today. She is strongly encouraged to exercise and work on weight loss as these are causing the diabetes likely.

## 2015-04-07 NOTE — Assessment & Plan Note (Signed)
BP controlled on lasix, atenolol, lisinopril. BMP checked recently and will not repeat today.

## 2015-04-07 NOTE — Assessment & Plan Note (Signed)
She is up several pounds since last visit and talked to her about the seriousness of weight gain in causing her more problems with back and leg pain as well as blood pressure and diabetes. This is complicated and causing hypertension and diabetes type 2. She is currently taking metformin 500 mg BID. Not exercising although we did try some pain medication in the hopes of getting her moving so that she could exercise.

## 2015-04-07 NOTE — Progress Notes (Signed)
Pre visit review using our clinic review tool, if applicable. No additional management support is needed unless otherwise documented below in the visit note. 

## 2015-04-07 NOTE — Progress Notes (Signed)
Subjective:   [name] is a 74 y.o. female who presents for Medicare Annual (Subsequent) preventive examination.  Review of Systems:     Summary of visit  Well visit instructions printed  Vision apt: Need referral   Hearing apt  Completed Jan of 2016  Dental: last year; to be scheduled when she finds someone that will take her insurance    No cognitive screen; Memory deficits in regard to day and date; stated current event; failed clock test but can tell time on watch; failed serial 3 from 20; States children stay in her home and "worry" her and wishes they would leave. MMSE: Estimate of 20 but can write a sentence; failed clock;   BMI reviewed with add'l recommendations for BMI >30/ Diet consist of "hogtype bacon" potatoes; rice; chicken wings; no issues voiced regarding cooking.  Denies any issues cooking Has had PT in home for fall prevention in the past.  Immunization reviewed; Declines vaccinations Has had mammogram and pap test with ob-gyn, Dr. Matthew Saras Colonoscopy, but not sure of date Foot exam and vision to be completed Bone density completed in 2015 Given information regarding advanced directives  Overall; no voiced failures at this time with independent living; Has support of family; can call on transportation as needed  Meds reviewed / adherent       Objective:     Vitals: BP 142/90 mmHg  Pulse 74  Temp(Src) 97.7 F (36.5 C) (Oral)  Ht 5\' 4"  (1.626 m)  Wt 236 lb (107.049 kg)  BMI 40.49 kg/m2  SpO2 97%  Tobacco History  Smoking status  . Former Smoker -- 0.25 packs/day for 30 years  . Types: Cigarettes  Smokeless tobacco  . Former Systems developer  . Quit date: 06/13/2004    Comment: Quit when she went to nursing home/ states she light smoker; Only smoked periodically; Pack years undetermined     Counseling given: Not Answered   Past Medical History  Diagnosis Date  . Stroke   . Acute MI   . Asthma   . Diabetes mellitus   .  Arthritis   . Gout    Past Surgical History  Procedure Laterality Date  . Joint replacement    . Back surgery Left 2003    left knee replacement and left shoulder   Family History  Problem Relation Age of Onset  . Cancer Brother   . Cancer Mother    History  Sexual Activity  . Sexual Activity: Not Currently    Outpatient Encounter Prescriptions as of 04/07/2015  Medication Sig  . albuterol (VENTOLIN HFA) 108 (90 BASE) MCG/ACT inhaler Inhale 2 puffs into the lungs every 6 (six) hours as needed for wheezing or shortness of breath (SOB).  Marland Kitchen allopurinol (ZYLOPRIM) 100 MG tablet Take 200 mg by mouth daily.  Marland Kitchen apixaban (ELIQUIS) 5 MG TABS tablet Take 1 tablet (5 mg total) by mouth 2 (two) times daily.  Marland Kitchen atenolol (TENORMIN) 50 MG tablet 50 mg. One and one half tablets by mouth daily.  . clonazePAM (KLONOPIN) 0.5 MG tablet Take 1 tablet (0.5 mg total) by mouth 2 (two) times daily as needed for anxiety (anxiety).  . DULoxetine (CYMBALTA) 60 MG capsule Take 60 mg by mouth daily.  . Fluticasone-Salmeterol (ADVAIR) 100-50 MCG/DOSE AEPB Inhale 1 puff into the lungs 2 (two) times daily.  . furosemide (LASIX) 40 MG tablet Take 40 mg by mouth daily as needed (leg swelling).  Marland Kitchen lisinopril (PRINIVIL,ZESTRIL) 10 MG tablet Take  10 mg by mouth daily.  Marland Kitchen loratadine (CLARITIN) 10 MG tablet Take 10 mg by mouth daily as needed for allergies (one tab daily by mouth as needed).  . metFORMIN (GLUCOPHAGE) 500 MG tablet Take 500 mg by mouth 2 (two) times daily with a meal.  . omeprazole (PRILOSEC) 20 MG capsule Take 20 mg by mouth daily.  . simvastatin (ZOCOR) 20 MG tablet Take 20 mg by mouth daily.  Marland Kitchen HYDROcodone-acetaminophen (NORCO/VICODIN) 5-325 MG per tablet Take 1 tablet by mouth 2 (two) times daily as needed for moderate pain.    Activities of Daily Living In your present state of health, do you have any difficulty performing the following activities: 04/07/2015 11/06/2014  Hearing? N -  Vision? N -    Difficulty concentrating or making decisions? N -  Walking or climbing stairs? Y -  Dressing or bathing? N -  Doing errands, shopping? Aggie Moats    Patient Care Team: Olga Millers, MD as PCP - General (Internal Medicine) Shelly Bombard, MD as Consulting Physician (Obstetrics and Gynecology)    Assessment:     Exercise Activities and Dietary recommendations    Goals    None     Fall Risk Fall Risk  04/07/2015  Falls in the past year? No  Risk for fall due to : Impaired mobility  Risk for fall due to (comments): uses cane on right/hurts on right   Depression Screen PHQ 2/9 Scores 04/07/2015  PHQ - 2 Score 0     Cognitive Testing No flowsheet data found.  There is no immunization history for the selected administration types on file for this patient. Screening Tests Health Maintenance  Topic Date Due  . FOOT EXAM  05/11/1952  . OPHTHALMOLOGY EXAM  05/11/1952  . URINE MICROALBUMIN  05/11/1952  . COLONOSCOPY  05/11/1992  . PNA vac Low Risk Adult (1 of 2 - PCV13) 05/12/2007  . INFLUENZA VACCINE  02/13/2016 (Originally 07/27/2015)  . ZOSTAVAX  02/13/2016 (Originally 05/11/2002)  . TETANUS/TDAP  02/13/2016 (Originally 05/11/1961)  . HEMOGLOBIN A1C  08/13/2015  . MAMMOGRAM  03/15/2017  . DEXA SCAN  Completed      Plan:   During the course of the visit the patient was educated and counseled about the following appropriate screening and preventive services:   Vaccines to include Pneumoccal, Influenza, Hepatitis B, Td, Zostavax, HCV/refuses "shots"  Electrocardiogram  Cardiovascular Disease   Colorectal cancer screening ; reviewed  Bone density screening - 2015 completed  Diabetes screening; labs per MD  Glaucoma screening: vision to be scheduled  Mammography/PAP : completed per patient  Nutrition counseling: completed  Patient Instructions (the written plan) was given to the patient.   Wynetta Fines, RN  04/07/2015

## 2015-04-07 NOTE — Progress Notes (Signed)
   Subjective:    Patient ID: [name], female    DOB: [birth year], 74 y.o.   MRN: [id number]  HPI Here for medicare wellness, uses her hydrocodone once a day and it works but takes a few minutes to kick in.   Diet: poor Physical activity: sedentary Depression/mood screen: negative Hearing: has hearing aids Visual acuity: wears corrective lens ADLs: capable Fall risk: none Home safety: good Cognitive evaluation: intact to orientation, naming, recall and repetition EOL planning: adv directives, full code/ I agree  I have personally reviewed and have noted 1. The patient's medical and social history 2. Their use of alcohol, tobacco or illicit drugs 3. Their current medications and supplements 4. The patient's functional ability including ADL's, fall risks, home safety risks and hearing or visual impairment. 5. Diet and physical activities 6. Evidence for depression or mood disorders 7. Care team reviewed and updated (available in snapshot)  Review of Systems  Constitutional: Positive for activity change. Negative for fever, appetite change, fatigue and unexpected weight change.  HENT: Negative.   Eyes: Negative.   Respiratory: Negative for cough, chest tightness, shortness of breath and wheezing.   Cardiovascular: Positive for leg swelling. Negative for chest pain and palpitations.  Gastrointestinal: Negative for abdominal pain, diarrhea, constipation and abdominal distention.  Genitourinary: Negative.   Musculoskeletal: Positive for myalgias, back pain, arthralgias and gait problem.  Skin: Negative for rash and wound.  Neurological: Negative for dizziness, speech difficulty, weakness and light-headedness.       Gait imbalance  Psychiatric/Behavioral: Positive for dysphoric mood and decreased concentration.      Objective:   Physical Exam  Constitutional: She appears well-developed.  Not well kempt, overweight  HENT:  Head: Normocephalic and atraumatic.  Eyes:  EOM are normal.  Neck: Normal range of motion.  Cardiovascular: Normal rate.   Pulmonary/Chest: Effort normal and breath sounds normal. No respiratory distress. She has no wheezes. She has no rales.  Abdominal: Soft. Bowel sounds are normal.  Musculoskeletal: She exhibits edema.  Edema 1+ bilaterally, no signs of cellulitis or rash  Neurological: She is alert. Coordination abnormal.  Uses cane  Skin: Skin is warm and dry.  Psychiatric:  Hard to follow and poor historian.    Filed Vitals:   04/07/15 0817 04/07/15 0919  BP: 142/90 118/84  Pulse: 74   Temp: 97.7 F (36.5 C)   TempSrc: Oral   Resp: 16   Height: 5\' 4"  (1.626 m)   Weight: 236 lb (107.049 kg)   SpO2: 97%       Assessment & Plan:

## 2015-04-07 NOTE — Patient Instructions (Addendum)
We are not changing your medicines today. Come back in about 3 months.  Colonoscopy A colonoscopy is an exam to look at the entire large intestine (colon). This exam can help find problems such as tumors, polyps, inflammation, and areas of bleeding. The exam takes about 1 hour.  LET [facility] CARE PROVIDER KNOW ABOUT:   Any allergies you have.  All medicines you are taking, including vitamins, herbs, eye drops, creams, and over-the-counter medicines.  Previous problems you or members of your family have had with the use of anesthetics.  Any blood disorders you have.  Previous surgeries you have had.  Medical conditions you have. RISKS AND COMPLICATIONS  Generally, this is a safe procedure. However, as with any procedure, complications can occur. Possible complications include:  Bleeding.  Tearing or rupture of the colon wall.  Reaction to medicines given during the exam.  Infection (rare). BEFORE THE PROCEDURE   Ask your health care provider about changing or stopping your regular medicines.  You may be prescribed an oral bowel prep. This involves drinking a large amount of medicated liquid, starting the day before your procedure. The liquid will cause you to have multiple loose stools until your stool is almost clear or light green. This cleans out your colon in preparation for the procedure.  Do not eat or drink anything else once you have started the bowel prep, unless your health care provider tells you it is safe to do so.  Arrange for someone to drive you home after the procedure. PROCEDURE   You will be given medicine to help you relax (sedative).  You will lie on your side with your knees bent.  A long, flexible tube with a light and camera on the end (colonoscope) will be inserted through the rectum and into the colon. The camera sends video back to a computer screen as it moves through the colon. The colonoscope also releases carbon dioxide gas to inflate the  colon. This helps your health care provider see the area better.  During the exam, your health care provider may take a small tissue sample (biopsy) to be examined under a microscope if any abnormalities are found.  The exam is finished when the entire colon has been viewed. AFTER THE PROCEDURE   Do not drive for 24 hours after the exam.  You may have a small amount of blood in your stool.  You may pass moderate amounts of gas and have mild abdominal cramping or bloating. This is caused by the gas used to inflate your colon during the exam.  Ask when your test results will be ready and how you will get your results. Make sure you get your test results. Document Released: 12/09/2000 Document Revised: 10/02/2013 Document Reviewed: 08/19/2013 [employer] [facility] Patient Information 2015 [place], [place]. This information is not intended to replace advice given to you by your health care provider. Make sure you discuss any questions you have with your health care provider.  Fat and Cholesterol Control Diet Your diet has an affect on your fat and cholesterol levels in your blood and organs. Too much fat and cholesterol in your blood can affect your:  Heart.  Blood vessels (arteries, veins).  Gallbladder.  Liver.  Pancreas. CONTROL FAT AND CHOLESTEROL WITH DIET Certain foods raise cholesterol and others lower it. It is important to replace bad fats with other types of fat.  Do not eat:  Fatty meats, such as hot dogs and salami.  Stick margarine and some tub margarines that  have "partially hydrogenated oils" in them.  Baked goods, such as cookies and crackers that have "partially hydrogenated oils" in them.  Saturated tropical oils, such as coconut and palm oil. Eat the following foods:  Round or loin cuts of red meat.  Chicken (without skin).  Fish.  Veal.  [place] breast.  Shellfish.  Fruit, such as apples.  Vegetables, such as broccoli, potatoes, and carrots.  Beans,  peas, and lentils (legumes).  Grains, such as barley, rice, couscous, and bulgar wheat.  Pasta (without cream sauces). Look for foods that are nonfat, low in fat, and low in cholesterol.  FIND FOODS THAT ARE LOWER IN FAT AND CHOLESTEROL  Find foods with soluble fiber and plant sterols (phytosterol). You should eat 2 grams a day of these foods. These foods include:  Fruits.  Vegetables.  Whole grains.  Dried beans and peas.  Nuts and seeds.  Read package labels. Look for low-saturated fats, trans fat free, low-fat foods.  Choose cheese that have only 2 to 3 grams of saturated fat per ounce.  Use heart-healthy tub margarine that is free of trans fat or partially hydrogenated oil.  Avoid buying baked goods that have partially hydrogenated oils in them. Instead, buy baked goods made with whole grains (whole-wheat or whole oat flour). Avoid baked goods labeled with "flour" or "enriched flour."  Buy non-creamy canned soups with reduced salt and no added fats. PREPARING YOUR FOOD  Broil, bake, steam, or roast foods. Do not fry food.  Use non-stick cooking sprays.  Use lemon or herbs to flavor food instead of using butter or stick margarine.  Use nonfat yogurt, salsa, or low-fat dressings for salads. LOW-SATURATED FAT / LOW-FAT FOOD SUBSTITUTES  Meats / Saturated Fat (g)  Avoid: Steak, marbled (3 oz/85 g) / 11 g.  Choose: Steak, lean (3 oz/85 g) / 4 g.  Avoid: Hamburger (3 oz/85 g) / 7 g.  Choose: Hamburger, lean (3 oz/85 g) / 5 g.  Avoid: Ham (3 oz/85 g) / 6 g.  Choose: Ham, lean cut (3 oz/85 g) / 2.4 g.  Avoid: Chicken, with skin, dark meat (3 oz/85 g) / 4 g.  Choose: Chicken, skin removed, dark meat (3 oz/85 g) / 2 g.  Avoid: Chicken, with skin, light meat (3 oz/85 g) / 2.5 g.  Choose: Chicken, skin removed, light meat (3 oz/85 g) / 1 g. Dairy / Saturated Fat (g)  Avoid: Whole milk (1 cup) / 5 g.  Choose: Low-fat milk, 2% (1 cup) / 3 g.  Choose: Low-fat  milk, 1% (1 cup) / 1.5 g.  Choose: Skim milk (1 cup) / 0.3 g.  Avoid: Hard cheese (1 oz/28 g) / 6 g.  Choose: Skim milk cheese (1 oz/28 g) / 2 to 3 g.  Avoid: Cottage cheese, 4% fat (1 cup) / 6.5 g.  Choose: Low-fat cottage cheese, 1% fat (1 cup) / 1.5 g.  Avoid: Ice cream (1 cup) / 9 g.  Choose: Sherbet (1 cup) / 2.5 g.  Choose: Nonfat frozen yogurt (1 cup) / 0.3 g.  Choose: Frozen fruit bar / trace.  Avoid: Whipped cream (1 tbs) / 3.5 g.  Choose: Nondairy whipped topping (1 tbs) / 1 g. Condiments / Saturated Fat (g)  Avoid: Mayonnaise (1 tbs) / 2 g.  Choose: Low-fat mayonnaise (1 tbs) / 1 g.  Avoid: Butter (1 tbs) / 7 g.  Choose: Extra light margarine (1 tbs) / 1 g.  Avoid: Coconut oil (1 tbs) /  11.8 g.  Choose: Olive oil (1 tbs) / 1.8 g.  Choose: Corn oil (1 tbs) / 1.7 g.  Choose: Safflower oil (1 tbs) / 1.2 g.  Choose: Sunflower oil (1 tbs) / 1.4 g.  Choose: Soybean oil (1 tbs) / 2.4 g .  Choose: Canola oil (1 tbs) / 1 g. Document Released: 06/12/2012 Document Revised: 08/14/2013 Document Reviewed: 03/13/2014 [facility] Patient Information 2015 [place], [place]. This information is not intended to replace advice given to you by your health care provider. Make sure you discuss any questions you have with your health care provider.  Fall Prevention and Home Safety Falls cause injuries and can affect all age groups. It is possible to prevent falls.  HOW TO PREVENT FALLS  Wear shoes with rubber soles that do not have an opening for your toes.  Keep the inside and outside of your house well lit.  Use night lights throughout your home.  Remove clutter from floors.  Clean up floor spills.  Remove throw rugs or fasten them to the floor with carpet tape.  Do not place electrical cords across pathways.  Put grab bars by your tub, shower, and toilet. Do not use towel bars as grab bars.  Put handrails on both sides of the stairway. Fix loose handrails.  Do  not climb on stools or stepladders, if possible.  Do not wax your floors.  Repair uneven or unsafe sidewalks, walkways, or stairs.  Keep items you use a lot within reach.  Be aware of pets.  Keep emergency numbers next to the telephone.  Put smoke detectors in your home and near bedrooms. Ask your doctor what other things you can do to prevent falls. Document Released: 10/08/2009 Document Revised: 06/12/2012 Document Reviewed: 03/13/2012 [facility] Patient Information 2015 [place], [place]. This information is not intended to replace advice given to you by your health care provider. Make sure you discuss any questions you have with your health care provider.  Health Maintenance Adopting a healthy lifestyle and getting preventive care can go a long way to promote health and wellness. Talk with your health care provider about what schedule of regular examinations is right for you. This is a good chance for you to check in with your provider about disease prevention and staying healthy. In between checkups, there are plenty of things you can do on your own. Experts have done a lot of research about which lifestyle changes and preventive measures are most likely to keep you healthy. Ask your health care provider for more information. WEIGHT AND DIET  Eat a healthy diet  Be sure to include plenty of vegetables, fruits, low-fat dairy products, and lean protein.  Do not eat a lot of foods high in solid fats, added sugars, or salt.  Get regular exercise. This is one of the most important things you can do for your health.  Most adults should exercise for at least 150 minutes each week. The exercise should increase your heart rate and make you sweat (moderate-intensity exercise).  Most adults should also do strengthening exercises at least twice a week. This is in addition to the moderate-intensity exercise.  Maintain a healthy weight  Body mass index (BMI) is a measurement that can be used to  identify possible weight problems. It estimates body fat based on height and weight. Your health care provider can help determine your BMI and help you achieve or maintain a healthy weight.  For females 5 years of age and older:   A BMI  below 18.5 is considered underweight.  A BMI of 18.5 to 24.9 is normal.  A BMI of 25 to 29.9 is considered overweight.  A BMI of 30 and above is considered obese.  Watch levels of cholesterol and blood lipids  You should start having your blood tested for lipids and cholesterol at 73 years of age, then have this test every 5 years.  You may need to have your cholesterol levels checked more often if:  Your lipid or cholesterol levels are high.  You are older than 73 years of age.  You are at high risk for heart disease.  CANCER SCREENING   Lung Cancer  Lung cancer screening is recommended for adults 51-60 years old who are at high risk for lung cancer because of a history of smoking.  A yearly low-dose CT scan of the lungs is recommended for people who:  Currently smoke.  Have quit within the past 15 years.  Have at least a 30-pack-year history of smoking. A pack year is smoking an average of one pack of cigarettes a day for 1 year.  Yearly screening should continue until it has been 15 years since you quit.  Yearly screening should stop if you develop a health problem that would prevent you from having lung cancer treatment.  Breast Cancer  Practice breast self-awareness. This means understanding how your breasts normally appear and feel.  It also means doing regular breast self-exams. Let your health care provider know about any changes, no matter how small.  If you are in your 20s or 30s, you should have a clinical breast exam (CBE) by a health care provider every 1-3 years as part of a regular health exam.  If you are 6 or older, have a CBE every year. Also consider having a breast X-ray (mammogram) every year.  If you have a  family history of breast cancer, talk to your health care provider about genetic screening.  If you are at high risk for breast cancer, talk to your health care provider about having an MRI and a mammogram every year.  Breast cancer gene (BRCA) assessment is recommended for women who have family members with BRCA-related cancers. BRCA-related cancers include:  Breast.  Ovarian.  Tubal.  Peritoneal cancers.  Results of the assessment will determine the need for genetic counseling and BRCA1 and BRCA2 testing. Cervical Cancer Routine pelvic examinations to screen for cervical cancer are no longer recommended for nonpregnant women who are considered low risk for cancer of the pelvic organs (ovaries, uterus, and vagina) and who do not have symptoms. A pelvic examination may be necessary if you have symptoms including those associated with pelvic infections. Ask your health care provider if a screening pelvic exam is right for you.   The Pap test is the screening test for cervical cancer for women who are considered at risk.  If you had a hysterectomy for a problem that was not cancer or a condition that could lead to cancer, then you no longer need Pap tests.  If you are older than 65 years, and you have had normal Pap tests for the past 10 years, you no longer need to have Pap tests.  If you have had past treatment for cervical cancer or a condition that could lead to cancer, you need Pap tests and screening for cancer for at least 20 years after your treatment.  If you no longer get a Pap test, assess your risk factors if they change (such as having  a new sexual partner). This can affect whether you should start being screened again.  Some women have medical problems that increase their chance of getting cervical cancer. If this is the case for you, your health care provider may recommend more frequent screening and Pap tests.  The human papillomavirus (HPV) test is another test that may  be used for cervical cancer screening. The HPV test looks for the virus that can cause cell changes in the cervix. The cells collected during the Pap test can be tested for HPV.  The HPV test can be used to screen women 39 years of age and older. Getting tested for HPV can extend the interval between normal Pap tests from three to five years.  An HPV test also should be used to screen women of any age who have unclear Pap test results.  After 73 years of age, women should have HPV testing as often as Pap tests.  Colorectal Cancer  This type of cancer can be detected and often prevented.  Routine colorectal cancer screening usually begins at 73 years of age and continues through 73 years of age.  Your health care provider may recommend screening at an earlier age if you have risk factors for colon cancer.  Your health care provider may also recommend using home test kits to check for hidden blood in the stool.  A small camera at the end of a tube can be used to examine your colon directly (sigmoidoscopy or colonoscopy). This is done to check for the earliest forms of colorectal cancer.  Routine screening usually begins at age 64.  Direct examination of the colon should be repeated every 5-10 years through 73 years of age. However, you may need to be screened more often if early forms of precancerous polyps or small growths are found. Skin Cancer  Check your skin from head to toe regularly.  Tell your health care provider about any new moles or changes in moles, especially if there is a change in a mole's shape or color.  Also tell your health care provider if you have a mole that is larger than the size of a pencil eraser.  Always use sunscreen. Apply sunscreen liberally and repeatedly throughout the day.  Protect yourself by wearing long sleeves, pants, a wide-brimmed hat, and sunglasses whenever you are outside. HEART DISEASE, DIABETES, AND HIGH BLOOD PRESSURE   Have your blood  pressure checked at least every 1-2 years. High blood pressure causes heart disease and increases the risk of stroke.  If you are between 73 years and 8 years old, ask your health care provider if you should take aspirin to prevent strokes.  Have regular diabetes screenings. This involves taking a blood sample to check your fasting blood sugar level.  If you are at a normal weight and have a low risk for diabetes, have this test once every three years after 74 years of age.  If you are overweight and have a high risk for diabetes, consider being tested at a younger age or more often. PREVENTING INFECTION  Hepatitis B  If you have a higher risk for hepatitis B, you should be screened for this virus. You are considered at high risk for hepatitis B if:  You were born in a country where hepatitis B is common. Ask your health care provider which countries are considered high risk.  Your parents were born in a high-risk country, and you have not been immunized against hepatitis B (hepatitis B vaccine).  You have HIV or AIDS.  You use needles to inject street drugs.  You live with someone who has hepatitis B.  You have had sex with someone who has hepatitis B.  You get hemodialysis treatment.  You take certain medicines for conditions, including cancer, organ transplantation, and autoimmune conditions. Hepatitis C  Blood testing is recommended for:  Everyone born from [birth year] through 1964.  Anyone with known risk factors for hepatitis C. Sexually transmitted infections (STIs)  You should be screened for sexually transmitted infections (STIs) including gonorrhea and chlamydia if:  You are sexually active and are younger than 73 years of age.  You are older than 73 years of age and your health care provider tells you that you are at risk for this type of infection.  Your sexual activity has changed since you were last screened and you are at an increased risk for chlamydia or  gonorrhea. Ask your health care provider if you are at risk.  If you do not have HIV, but are at risk, it may be recommended that you take a prescription medicine daily to prevent HIV infection. This is called pre-exposure prophylaxis (PrEP). You are considered at risk if:  You are sexually active and do not regularly use condoms or know the HIV status of your partner(s).  You take drugs by injection.  You are sexually active with a partner who has HIV. Talk with your health care provider about whether you are at high risk of being infected with HIV. If you choose to begin PrEP, you should first be tested for HIV. You should then be tested every 3 months for as long as you are taking PrEP.  PREGNANCY   If you are premenopausal and you may become pregnant, ask your health care provider about preconception counseling.  If you may become pregnant, take 400 to 800 micrograms (mcg) of folic acid every day.  If you want to prevent pregnancy, talk to your health care provider about birth control (contraception). OSTEOPOROSIS AND MENOPAUSE   Osteoporosis is a disease in which the bones lose minerals and strength with aging. This can result in serious bone fractures. Your risk for osteoporosis can be identified using a bone density scan.  If you are 52 years of age or older, or if you are at risk for osteoporosis and fractures, ask your health care provider if you should be screened.  Ask your health care provider whether you should take a calcium or vitamin D supplement to lower your risk for osteoporosis.  Menopause may have certain physical symptoms and risks.  Hormone replacement therapy may reduce some of these symptoms and risks. Talk to your health care provider about whether hormone replacement therapy is right for you.  HOME CARE INSTRUCTIONS   Schedule regular health, dental, and eye exams.  Stay current with your immunizations.   Do not use any tobacco products including  cigarettes, chewing tobacco, or electronic cigarettes.  If you are pregnant, do not drink alcohol.  If you are breastfeeding, limit how much and how often you drink alcohol.  Limit alcohol intake to no more than 1 drink per day for nonpregnant women. One drink equals 12 ounces of beer, 5 ounces of wine, or 1 ounces of hard liquor.  Do not use street drugs.  Do not share needles.  Ask your health care provider for help if you need support or information about quitting drugs.  Tell your health care provider if you often feel depressed.  Tell  your health care provider if you have ever been abused or do not feel safe at home. Document Released: 06/27/2011 Document Revised: 04/28/2014 Document Reviewed: 11/13/2013 [facility] Patient Information 2015 [place], [place]. This information is not intended to replace advice given to you by your health care provider. Make sure you discuss any questions you have with your health care provider.

## 2015-04-07 NOTE — Assessment & Plan Note (Signed)
Talked to her today about what is osteoarthritis and the fact that her severe obesity has caused extra wear and tear of her joints over time and now she is having pains. Talked with her about weight loss as a form of therapy to reduce her pain. She does not feel able to change her diet or increase exercise today.

## 2015-04-07 NOTE — Assessment & Plan Note (Signed)
Doing well since last visit and will continue the advair. Not in flare today.

## 2015-04-09 DIAGNOSIS — I10 Essential (primary) hypertension: Secondary | ICD-10-CM | POA: Diagnosis not present

## 2015-04-09 DIAGNOSIS — M539 Dorsopathy, unspecified: Secondary | ICD-10-CM | POA: Diagnosis not present

## 2015-04-09 DIAGNOSIS — R32 Unspecified urinary incontinence: Secondary | ICD-10-CM | POA: Diagnosis not present

## 2015-04-09 DIAGNOSIS — I213 ST elevation (STEMI) myocardial infarction of unspecified site: Secondary | ICD-10-CM | POA: Diagnosis not present

## 2015-04-09 DIAGNOSIS — M159 Polyosteoarthritis, unspecified: Secondary | ICD-10-CM | POA: Diagnosis not present

## 2015-04-10 DIAGNOSIS — I10 Essential (primary) hypertension: Secondary | ICD-10-CM | POA: Diagnosis not present

## 2015-04-13 DIAGNOSIS — I10 Essential (primary) hypertension: Secondary | ICD-10-CM | POA: Diagnosis not present

## 2015-04-14 DIAGNOSIS — I10 Essential (primary) hypertension: Secondary | ICD-10-CM | POA: Diagnosis not present

## 2015-04-15 DIAGNOSIS — I10 Essential (primary) hypertension: Secondary | ICD-10-CM | POA: Diagnosis not present

## 2015-04-16 DIAGNOSIS — I10 Essential (primary) hypertension: Secondary | ICD-10-CM | POA: Diagnosis not present

## 2015-04-17 DIAGNOSIS — I10 Essential (primary) hypertension: Secondary | ICD-10-CM | POA: Diagnosis not present

## 2015-04-20 DIAGNOSIS — I10 Essential (primary) hypertension: Secondary | ICD-10-CM | POA: Diagnosis not present

## 2015-04-21 DIAGNOSIS — I10 Essential (primary) hypertension: Secondary | ICD-10-CM | POA: Diagnosis not present

## 2015-04-22 DIAGNOSIS — I10 Essential (primary) hypertension: Secondary | ICD-10-CM | POA: Diagnosis not present

## 2015-04-23 DIAGNOSIS — I10 Essential (primary) hypertension: Secondary | ICD-10-CM | POA: Diagnosis not present

## 2015-04-24 DIAGNOSIS — I10 Essential (primary) hypertension: Secondary | ICD-10-CM | POA: Diagnosis not present

## 2015-04-25 DIAGNOSIS — I10 Essential (primary) hypertension: Secondary | ICD-10-CM | POA: Diagnosis not present

## 2015-04-26 DIAGNOSIS — I10 Essential (primary) hypertension: Secondary | ICD-10-CM | POA: Diagnosis not present

## 2015-04-27 ENCOUNTER — Telehealth: Payer: Self-pay | Admitting: Internal Medicine

## 2015-04-27 DIAGNOSIS — I10 Essential (primary) hypertension: Secondary | ICD-10-CM | POA: Diagnosis not present

## 2015-04-27 NOTE — Telephone Encounter (Signed)
Spoke with patient and she will come pick up her hydrocodone prescription on Friday the 6th.

## 2015-04-27 NOTE — Telephone Encounter (Signed)
[employer] called regarding patient calling them about Dr. Doug Sou not filling prescriptions because Dr. Donneta Romberg name is not on her insurance card. Patient is requesting refills for HYDROcodone-acetaminophen (NORCO/VICODIN) 5-325 MG per tablet [[id number]] and "nerve" pills. Pharmacy is [facility] on [address]. She will be getting a new card with her name on it within 7-10 days

## 2015-04-27 NOTE — Telephone Encounter (Signed)
Per, Dr. Doug Sou it is early for the hydrocodone. She may have it on the 6th. This is not an issue with the insurance cards. She is requesting refills too early. Dr. Doug Sou has discussed the "nerve pills" with the patient and has informed patient that she will not be refilling those because they are not appropriate.

## 2015-04-28 DIAGNOSIS — I10 Essential (primary) hypertension: Secondary | ICD-10-CM | POA: Diagnosis not present

## 2015-04-29 DIAGNOSIS — I10 Essential (primary) hypertension: Secondary | ICD-10-CM | POA: Diagnosis not present

## 2015-04-30 DIAGNOSIS — I10 Essential (primary) hypertension: Secondary | ICD-10-CM | POA: Diagnosis not present

## 2015-05-01 ENCOUNTER — Telehealth: Payer: Self-pay | Admitting: *Deleted

## 2015-05-01 ENCOUNTER — Encounter: Payer: Self-pay | Admitting: Internal Medicine

## 2015-05-01 NOTE — Telephone Encounter (Signed)
[place] Night - Client TELEPHONE ADVICE RECORD [facility] Patient Name: [name] Gender: Female DOB: [birth year] Age: 74 Y 11 M 14 D Return Phone Number: [phone] (Primary) Address: [address] City/State/Zip: [place] [address] Client [facility] Night - Client Client Site [place] - Night Physician [place], [place] Type Call Call Type Triage / Clinical Relationship To Patient Self Return Phone Number [phone] (Primary) Chief Complaint Headache Initial Comment caller states she is having headache Nurse Assessment Guidelines Guideline Title Affirmed Question Affirmed Notes Nurse Date/Time (Eastern Time) Disp. Time Eilene Ghazi Time) Disposition Final User 04/26/2015 3:49:51 PM Attempt made - message left Jimmye Norman RN, [place] 04/26/2015 4:04:23 PM Attempt made - no message left Pricilla Handler, Loree Fee 04/26/2015 5:22:27 PM Clinical Call Yes Jimmye Norman, RN, [place] After Care Instructions Given Call Event Type User Date / Time Description Comments User: Myriam Forehand, RN Date/Time (Eastern Time): 04/26/2015 4:59:31 PM Caller states no one there needs a nurse, wrong number User: Alroy Dust, [place] Date/Time (Eastern Time): 04/26/2015 5:04:42 PM Checked the phone number for the nurse, it was the same number we were given. User: Myriam Forehand, RN Date/Time (Eastern Time): 04/26/2015 5:22:51 PM phone number verified, no alternate phone number available

## 2015-05-02 DIAGNOSIS — I10 Essential (primary) hypertension: Secondary | ICD-10-CM | POA: Diagnosis not present

## 2015-05-03 DIAGNOSIS — I10 Essential (primary) hypertension: Secondary | ICD-10-CM | POA: Diagnosis not present

## 2015-05-05 ENCOUNTER — Other Ambulatory Visit: Payer: Self-pay | Admitting: Geriatric Medicine

## 2015-05-05 MED ORDER — HYDROCODONE-ACETAMINOPHEN 5-325 MG PO TABS: 1.0000 | ORAL_TABLET | Freq: Two times a day (BID) | ORAL | Status: DC | PRN

## 2015-05-11 DIAGNOSIS — I10 Essential (primary) hypertension: Secondary | ICD-10-CM | POA: Diagnosis not present

## 2015-05-12 DIAGNOSIS — I10 Essential (primary) hypertension: Secondary | ICD-10-CM | POA: Diagnosis not present

## 2015-05-13 DIAGNOSIS — I10 Essential (primary) hypertension: Secondary | ICD-10-CM | POA: Diagnosis not present

## 2015-05-14 DIAGNOSIS — I10 Essential (primary) hypertension: Secondary | ICD-10-CM | POA: Diagnosis not present

## 2015-05-15 DIAGNOSIS — I10 Essential (primary) hypertension: Secondary | ICD-10-CM | POA: Diagnosis not present

## 2015-05-18 DIAGNOSIS — I10 Essential (primary) hypertension: Secondary | ICD-10-CM | POA: Diagnosis not present

## 2015-05-19 DIAGNOSIS — I10 Essential (primary) hypertension: Secondary | ICD-10-CM | POA: Diagnosis not present

## 2015-05-20 DIAGNOSIS — I10 Essential (primary) hypertension: Secondary | ICD-10-CM | POA: Diagnosis not present

## 2015-05-21 DIAGNOSIS — I10 Essential (primary) hypertension: Secondary | ICD-10-CM | POA: Diagnosis not present

## 2015-05-22 DIAGNOSIS — I10 Essential (primary) hypertension: Secondary | ICD-10-CM | POA: Diagnosis not present

## 2015-05-25 DIAGNOSIS — I10 Essential (primary) hypertension: Secondary | ICD-10-CM | POA: Diagnosis not present

## 2015-05-26 DIAGNOSIS — I10 Essential (primary) hypertension: Secondary | ICD-10-CM | POA: Diagnosis not present

## 2015-05-27 DIAGNOSIS — I10 Essential (primary) hypertension: Secondary | ICD-10-CM | POA: Diagnosis not present

## 2015-05-28 DIAGNOSIS — I10 Essential (primary) hypertension: Secondary | ICD-10-CM | POA: Diagnosis not present

## 2015-05-29 DIAGNOSIS — I10 Essential (primary) hypertension: Secondary | ICD-10-CM | POA: Diagnosis not present

## 2015-06-01 ENCOUNTER — Telehealth: Payer: Self-pay | Admitting: Internal Medicine

## 2015-06-01 DIAGNOSIS — I10 Essential (primary) hypertension: Secondary | ICD-10-CM | POA: Diagnosis not present

## 2015-06-01 NOTE — Telephone Encounter (Signed)
Requesting a refill of HYDROcodone-acetaminophen (NORCO/VICODIN) 5-325 MG per tablet [[id number]]

## 2015-06-02 DIAGNOSIS — I10 Essential (primary) hypertension: Secondary | ICD-10-CM | POA: Diagnosis not present

## 2015-06-02 MED ORDER — HYDROCODONE-ACETAMINOPHEN 5-325 MG PO TABS: 1.0000 | ORAL_TABLET | Freq: Two times a day (BID) | ORAL | Status: DC | PRN

## 2015-06-02 NOTE — Telephone Encounter (Signed)
Printed and signed. Needs UDS.

## 2015-06-02 NOTE — Telephone Encounter (Signed)
Left message informing patient that she has a prescription here ready at the front for pick up. She also needs a UDS.

## 2015-06-03 DIAGNOSIS — I10 Essential (primary) hypertension: Secondary | ICD-10-CM | POA: Diagnosis not present

## 2015-06-04 DIAGNOSIS — I10 Essential (primary) hypertension: Secondary | ICD-10-CM | POA: Diagnosis not present

## 2015-06-05 ENCOUNTER — Other Ambulatory Visit: Payer: Self-pay | Admitting: Gastroenterology

## 2015-06-05 DIAGNOSIS — K5730 Diverticulosis of large intestine without perforation or abscess without bleeding: Secondary | ICD-10-CM | POA: Diagnosis not present

## 2015-06-05 DIAGNOSIS — D123 Benign neoplasm of transverse colon: Secondary | ICD-10-CM | POA: Diagnosis not present

## 2015-06-05 DIAGNOSIS — D126 Benign neoplasm of colon, unspecified: Secondary | ICD-10-CM | POA: Diagnosis not present

## 2015-06-05 DIAGNOSIS — Z1211 Encounter for screening for malignant neoplasm of colon: Secondary | ICD-10-CM | POA: Diagnosis not present

## 2015-06-05 DIAGNOSIS — Z09 Encounter for follow-up examination after completed treatment for conditions other than malignant neoplasm: Secondary | ICD-10-CM | POA: Diagnosis not present

## 2015-06-05 DIAGNOSIS — I10 Essential (primary) hypertension: Secondary | ICD-10-CM | POA: Diagnosis not present

## 2015-06-05 DIAGNOSIS — Z86010 Personal history of colonic polyps: Secondary | ICD-10-CM | POA: Diagnosis not present

## 2015-06-08 DIAGNOSIS — I10 Essential (primary) hypertension: Secondary | ICD-10-CM | POA: Diagnosis not present

## 2015-06-09 DIAGNOSIS — I10 Essential (primary) hypertension: Secondary | ICD-10-CM | POA: Diagnosis not present

## 2015-06-10 DIAGNOSIS — I213 ST elevation (STEMI) myocardial infarction of unspecified site: Secondary | ICD-10-CM | POA: Diagnosis not present

## 2015-06-10 DIAGNOSIS — R32 Unspecified urinary incontinence: Secondary | ICD-10-CM | POA: Diagnosis not present

## 2015-06-10 DIAGNOSIS — I10 Essential (primary) hypertension: Secondary | ICD-10-CM | POA: Diagnosis not present

## 2015-06-10 DIAGNOSIS — M539 Dorsopathy, unspecified: Secondary | ICD-10-CM | POA: Diagnosis not present

## 2015-06-10 DIAGNOSIS — M159 Polyosteoarthritis, unspecified: Secondary | ICD-10-CM | POA: Diagnosis not present

## 2015-06-11 DIAGNOSIS — I10 Essential (primary) hypertension: Secondary | ICD-10-CM | POA: Diagnosis not present

## 2015-06-12 DIAGNOSIS — I10 Essential (primary) hypertension: Secondary | ICD-10-CM | POA: Diagnosis not present

## 2015-07-07 ENCOUNTER — Other Ambulatory Visit (INDEPENDENT_AMBULATORY_CARE_PROVIDER_SITE_OTHER): Payer: Medicare Other

## 2015-07-07 ENCOUNTER — Encounter: Payer: Self-pay | Admitting: Internal Medicine

## 2015-07-07 ENCOUNTER — Ambulatory Visit (INDEPENDENT_AMBULATORY_CARE_PROVIDER_SITE_OTHER): Payer: Medicare Other | Admitting: Internal Medicine

## 2015-07-07 VITALS — BP 130/78 | HR 78 | Temp 98.2°F | Resp 16 | Wt 221.0 lb

## 2015-07-07 DIAGNOSIS — Z79891 Long term (current) use of opiate analgesic: Secondary | ICD-10-CM | POA: Diagnosis not present

## 2015-07-07 DIAGNOSIS — E119 Type 2 diabetes mellitus without complications: Secondary | ICD-10-CM

## 2015-07-07 DIAGNOSIS — M25559 Pain in unspecified hip: Secondary | ICD-10-CM | POA: Diagnosis not present

## 2015-07-07 DIAGNOSIS — M1990 Unspecified osteoarthritis, unspecified site: Secondary | ICD-10-CM

## 2015-07-07 LAB — COMPREHENSIVE METABOLIC PANEL
ALT: 13 U/L (ref 0–35)
AST: 23 U/L (ref 0–37)
Albumin: 3.8 g/dL (ref 3.5–5.2)
Alkaline Phosphatase: 81 U/L (ref 39–117)
BUN: 19 mg/dL (ref 6–23)
CO2: 30 mEq/L (ref 19–32)
Calcium: 9.4 mg/dL (ref 8.4–10.5)
Chloride: 106 mEq/L (ref 96–112)
Creatinine, Ser: 1.08 mg/dL (ref 0.40–1.20)
GFR: 63.93 mL/min (ref >60.00)
Glucose, Bld: 92 mg/dL (ref 70–99)
POTASSIUM: 3.8 meq/L (ref 3.5–5.1)
Sodium: 142 mEq/L (ref 135–145)
Total Bilirubin: 0.4 mg/dL (ref 0.2–1.2)
Total Protein: 6.6 g/dL (ref 6.0–8.3)

## 2015-07-07 LAB — HEMOGLOBIN A1C: Hgb A1c MFr Bld: 6.2 % (ref 4.6–6.5)

## 2015-07-07 MED ORDER — HYDROCODONE-ACETAMINOPHEN 5-325 MG PO TABS: 1.0000 | ORAL_TABLET | Freq: Two times a day (BID) | ORAL | Status: DC | PRN

## 2015-07-07 MED ORDER — SIMVASTATIN 20 MG PO TABS: 20.0000 mg | ORAL_TABLET | Freq: Every day | ORAL | Status: DC

## 2015-07-07 MED ORDER — FUROSEMIDE 40 MG PO TABS: 40.0000 mg | ORAL_TABLET | Freq: Every day | ORAL | Status: DC | PRN

## 2015-07-07 MED ORDER — LISINOPRIL 10 MG PO TABS: 10.0000 mg | ORAL_TABLET | Freq: Every day | ORAL | Status: DC

## 2015-07-07 NOTE — Assessment & Plan Note (Signed)
Checking Hga1c today and adjust as needed. Last HgA1c 6.6 and if that low can decrease metformin to 500 mg daily. She is on ACE-I.

## 2015-07-07 NOTE — Progress Notes (Signed)
   Subjective:    Patient ID: [name], female    DOB: 21-Jan-1941, 74 y.o.   MRN: [id number]  HPI The patient is a 74 YO female coming in to follow up on her pain control for her arthritis since starting hydrocodone. She states that she was on oxycodone in the past and it did much better for her pain. Denies any side effects from the medicine. Takes it about twice a day usually. She goes back and forth between saying she is out right now and that she is still taking it. No new complaints. Still struggling with her joints. Much worse when she does not move for awhile. Does not exercise at all.   Review of Systems  Constitutional: Positive for activity change. Negative for fever, appetite change, fatigue and unexpected weight change.  HENT: Negative.   Eyes: Negative.   Respiratory: Negative for cough, chest tightness, shortness of breath and wheezing.   Cardiovascular: Positive for leg swelling. Negative for chest pain and palpitations.  Gastrointestinal: Negative for abdominal pain, diarrhea, constipation and abdominal distention.  Genitourinary: Negative.   Musculoskeletal: Positive for myalgias, back pain, arthralgias and gait problem.  Skin: Negative for rash and wound.  Neurological: Negative for dizziness, speech difficulty, weakness and light-headedness.       Gait imbalance  Psychiatric/Behavioral: Positive for dysphoric mood and decreased concentration.      Objective:   Physical Exam  Constitutional: She appears well-developed.  Not well kempt, overweight  HENT:  Head: Normocephalic and atraumatic.  Eyes: EOM are normal.  Neck: Normal range of motion.  Cardiovascular: Normal rate.   Pulmonary/Chest: Effort normal and breath sounds normal. No respiratory distress. She has no wheezes. She has no rales.  Abdominal: Soft. Bowel sounds are normal.  Musculoskeletal: She exhibits no edema.  Neurological: She is alert. Coordination abnormal.  Uses cane  Skin: Skin is  warm and dry.  Psychiatric:  Hard to follow and poor historian.    Filed Vitals:   07/07/15 1004  BP: 130/78  Pulse: 78  Temp: 98.2 F (36.8 C)  TempSrc: Oral  Resp: 16  Weight: 221 lb (100.245 kg)  SpO2: 94%      Assessment & Plan:

## 2015-07-07 NOTE — Progress Notes (Signed)
Pre visit review using our clinic review tool, if applicable. No additional management support is needed unless otherwise documented below in the visit note. 

## 2015-07-07 NOTE — Patient Instructions (Addendum)
We will check the blood work and urine today and call you back with the results.   We would like you to go back to see Dr. Terrence Dupont.   We want you to work on exercising more regularly as this helps to strengthen muscles and keeps you more stable on your feet and also keeps your joints from getting stiff and hurting as much.   Exercise to Stay Healthy Exercise helps you become and stay healthy. EXERCISE IDEAS AND TIPS Choose exercises that:  You enjoy.  Fit into your day. You do not need to exercise really hard to be healthy. You can do exercises at a slow or medium level and stay healthy. You can:  Stretch before and after working out.  Try yoga, Pilates, or tai chi.  Lift weights.  Walk fast, swim, jog, run, climb stairs, bicycle, dance, or rollerskate.  Take aerobic classes. Exercises that burn about 150 calories:  Running 1  miles in 15 minutes.  Playing volleyball for 45 to 60 minutes.  Washing and waxing a car for 45 to 60 minutes.  Playing touch football for 45 minutes.  Walking 1  miles in 35 minutes.  Pushing a stroller 1  miles in 30 minutes.  Playing basketball for 30 minutes.  Raking leaves for 30 minutes.  Bicycling 5 miles in 30 minutes.  Walking 2 miles in 30 minutes.  Dancing for 30 minutes.  Shoveling snow for 15 minutes.  Swimming laps for 20 minutes.  Walking up stairs for 15 minutes.  Bicycling 4 miles in 15 minutes.  Gardening for 30 to 45 minutes.  Jumping rope for 15 minutes.  Washing windows or floors for 45 to 60 minutes. Document Released: 01/14/2011 Document Revised: 03/05/2012 Document Reviewed: 01/14/2011 [facility] Patient Information 2015 [place], [place]. This information is not intended to replace advice given to you by your health care provider. Make sure you discuss any questions you have with your health care provider.

## 2015-07-07 NOTE — Assessment & Plan Note (Addendum)
Good pain control if only using pain medicine twice a day max. UDS today and refill of her hydrocodone provided. Talked to her about more exercise as a way to keep her joints from getting stiff and hurting as much. Referral for orthopedics for her lack of stability and balance problems.

## 2015-07-14 DIAGNOSIS — J029 Acute pharyngitis, unspecified: Secondary | ICD-10-CM | POA: Diagnosis not present

## 2015-07-14 DIAGNOSIS — I2510 Atherosclerotic heart disease of native coronary artery without angina pectoris: Secondary | ICD-10-CM | POA: Diagnosis not present

## 2015-07-14 DIAGNOSIS — E119 Type 2 diabetes mellitus without complications: Secondary | ICD-10-CM | POA: Diagnosis not present

## 2015-07-14 DIAGNOSIS — I10 Essential (primary) hypertension: Secondary | ICD-10-CM | POA: Diagnosis not present

## 2015-07-14 DIAGNOSIS — I482 Chronic atrial fibrillation: Secondary | ICD-10-CM | POA: Diagnosis not present

## 2015-07-28 ENCOUNTER — Telehealth: Payer: Self-pay | Admitting: Internal Medicine

## 2015-07-28 NOTE — Telephone Encounter (Signed)
Patient states she would like to speak with someone regarding her hydrocodone prescription. CB# [phone]

## 2015-07-29 NOTE — Telephone Encounter (Signed)
Received call pt states she is having ongoing pain. She want to go back on oxycodone because hydrocodone does not help. Inform pt before md can change med she will need to make f/u appt to discuss her pain issues. Made appt 08/05/15...[name]

## 2015-08-04 ENCOUNTER — Ambulatory Visit (INDEPENDENT_AMBULATORY_CARE_PROVIDER_SITE_OTHER): Payer: Medicare Other | Admitting: Internal Medicine

## 2015-08-04 ENCOUNTER — Encounter: Payer: Self-pay | Admitting: Internal Medicine

## 2015-08-04 VITALS — BP 134/72 | HR 78 | Temp 98.4°F | Resp 18 | Wt 217.0 lb

## 2015-08-04 DIAGNOSIS — G894 Chronic pain syndrome: Secondary | ICD-10-CM | POA: Diagnosis not present

## 2015-08-04 MED ORDER — HYDROCODONE-ACETAMINOPHEN 5-325 MG PO TABS: 1.0000 | ORAL_TABLET | Freq: Two times a day (BID) | ORAL | Status: DC | PRN

## 2015-08-04 NOTE — Progress Notes (Signed)
Pre visit review using our clinic review tool, if applicable. No additional management support is needed unless otherwise documented below in the visit note. 

## 2015-08-04 NOTE — Progress Notes (Signed)
   Subjective:    Patient ID: [name], female    DOB: 1941-08-18, 74 y.o.   MRN: [id number]  HPI She states that the hydrocodone 5/325 prescribed twice a day is "working slower than usual". She describes diffuse pain in her back, hips, shoulder, wrist, and knees. She states "I'm not sure what's hurting at any time".  She states that she has fallen in the past; she states her last fall was probably in 2015. There was no neurologic or cardiac prodrome prior to the falls. She states that because of the total knee replacement bilaterally her knees will give way and she staggers.  She received 60 of the pills 07/07/15. It was difficult to discern whether she has any left;yet she states she does not take them twice a day every day.  Review of Systems  Denied were any change in heart rhythm or rate prior to the falls. There was no associated chest pain or shortness of breath .  Also specifically denied prior to the falls were headache, limb weakness, tingling, or numbness. No seizure activity noted.     Objective:   Physical Exam Pertinent or positive findings include: She appears younger than her stated age. A brisk right carotid bruit is present. She has a distant grade 1/9-4 systolic murmur at the right base. Her right wrist is in a soft brace. He has minor DIP changes of osteoarthritis in the hands. Fusiform knees are present with well-healed operative scars. Pedal pulses are decreased slightly. She has trace edema at the sock line. She ambulates with a cane. Her gait appears fairly stable.  General appearance :adequately nourished; in no distress.  Eyes: No conjunctival inflammation or scleral icterus is present.  Heart:  Normal rate and regular rhythm. S1 and S2 normal without gallop, click, rub or other extra sounds    Lungs:Chest clear to auscultation; no wheezes, rhonchi,rales ,or rubs present.No increased work of breathing.   Abdomen: bowel sounds normal, soft and  non-tender without masses, organomegaly or hernias noted.  No guarding or rebound. .  Vascular : all pulses equal ; no bruits present.  Skin:Warm & dry.  Intact without suspicious lesions or rashes ; no tenting  Lymphatic: No lymphadenopathy is noted about the head, neck, axilla  Neuro: Strength, tone decreased.        Assessment & Plan:  #1 chronic pain syndrome. Risk of present therapy discussed with her in detail. Referral to chronic pain clinic recommended. She declines this at this time. She was given 30 of the narcotic pain pills.

## 2015-08-04 NOTE — Patient Instructions (Signed)
I recommend a  Consultation with a Chronic Pain Clinic to determine optimal therapy. The narcotic pain medicine is among those which experts have documented to have a very  high risk of affecting  mental  alertness  & balance. This results in increased risk of falling with serious health or life threatening injury. Such medication should be taken as infrequently as possible and @  the lowest possible dose.It should not be taken with alcohol, sedatives  or other agents which have a similar  adverse risk potential. These risks are greater as we age as there is decreased ability of the liver and kidneys to metabolize and excrete the medication, resulting in  increased blood levels of the active ingredient.

## 2015-08-05 ENCOUNTER — Ambulatory Visit: Payer: Medicare Other | Admitting: Internal Medicine

## 2015-08-07 ENCOUNTER — Other Ambulatory Visit: Payer: Self-pay | Admitting: Emergency Medicine

## 2015-08-07 ENCOUNTER — Encounter: Payer: Self-pay | Admitting: Internal Medicine

## 2015-08-07 DIAGNOSIS — H5212 Myopia, left eye: Secondary | ICD-10-CM | POA: Diagnosis not present

## 2015-08-07 DIAGNOSIS — E119 Type 2 diabetes mellitus without complications: Secondary | ICD-10-CM | POA: Diagnosis not present

## 2015-08-07 MED ORDER — HYDROCODONE-ACETAMINOPHEN 5-325 MG PO TABS: 1.0000 | ORAL_TABLET | Freq: Two times a day (BID) | ORAL | Status: DC | PRN

## 2015-08-10 DIAGNOSIS — I213 ST elevation (STEMI) myocardial infarction of unspecified site: Secondary | ICD-10-CM | POA: Diagnosis not present

## 2015-08-10 DIAGNOSIS — R32 Unspecified urinary incontinence: Secondary | ICD-10-CM | POA: Diagnosis not present

## 2015-08-10 DIAGNOSIS — M159 Polyosteoarthritis, unspecified: Secondary | ICD-10-CM | POA: Diagnosis not present

## 2015-08-10 DIAGNOSIS — M539 Dorsopathy, unspecified: Secondary | ICD-10-CM | POA: Diagnosis not present

## 2015-08-25 DIAGNOSIS — H903 Sensorineural hearing loss, bilateral: Secondary | ICD-10-CM | POA: Diagnosis not present

## 2015-08-28 ENCOUNTER — Telehealth: Payer: Self-pay | Admitting: *Deleted

## 2015-08-28 NOTE — Telephone Encounter (Signed)
This appears to have been filled by Dr. [name] at visit on 08/07/15 and is not due until next week. Will wait on this.

## 2015-08-28 NOTE — Telephone Encounter (Signed)
Notified pt with md response.../lmb 

## 2015-08-28 NOTE — Telephone Encounter (Signed)
[name]

## 2015-09-03 DIAGNOSIS — Z79891 Long term (current) use of opiate analgesic: Secondary | ICD-10-CM | POA: Diagnosis not present

## 2015-09-03 MED ORDER — HYDROCODONE-ACETAMINOPHEN 5-325 MG PO TABS: 1.0000 | ORAL_TABLET | Freq: Two times a day (BID) | ORAL | Status: DC | PRN

## 2015-09-03 NOTE — Telephone Encounter (Signed)
Call and let her know she can pickup. Please UDS at pickup.

## 2015-09-03 NOTE — Telephone Encounter (Signed)
Notified pt rx ready for pick-up. Place in cabinet.../lmb 

## 2015-09-03 NOTE — Addendum Note (Signed)
Addended by: Vertell Novak A on: 09/03/2015 10:53 AM   Modules accepted: Orders

## 2015-09-23 DIAGNOSIS — R32 Unspecified urinary incontinence: Secondary | ICD-10-CM | POA: Diagnosis not present

## 2015-09-24 ENCOUNTER — Other Ambulatory Visit: Payer: Self-pay | Admitting: Internal Medicine

## 2015-09-28 ENCOUNTER — Telehealth: Payer: Self-pay | Admitting: Internal Medicine

## 2015-09-28 MED ORDER — HYDROCODONE-ACETAMINOPHEN 5-325 MG PO TABS: 1.0000 | ORAL_TABLET | Freq: Two times a day (BID) | ORAL | Status: DC | PRN

## 2015-09-28 NOTE — Telephone Encounter (Signed)
Pt requesting refill for HYDROcodone-acetaminophen (NORCO/VICODIN) 5-325 MG per tablet [[id number]]

## 2015-09-28 NOTE — Telephone Encounter (Signed)
Not due until 10/8 so can pickup Thursday or Friday. Please UDS at visit.

## 2015-09-28 NOTE — Telephone Encounter (Signed)
Patient aware and will come pick up 

## 2015-09-29 DIAGNOSIS — Z79899 Other long term (current) drug therapy: Secondary | ICD-10-CM | POA: Diagnosis not present

## 2015-10-20 DIAGNOSIS — R32 Unspecified urinary incontinence: Secondary | ICD-10-CM | POA: Diagnosis not present

## 2015-10-27 ENCOUNTER — Telehealth: Payer: Self-pay | Admitting: Internal Medicine

## 2015-10-27 MED ORDER — HYDROCODONE-ACETAMINOPHEN 5-325 MG PO TABS: 1.0000 | ORAL_TABLET | Freq: Two times a day (BID) | ORAL | Status: DC | PRN

## 2015-10-27 NOTE — Telephone Encounter (Signed)
Tried to reach patient. No answer.  

## 2015-10-27 NOTE — Telephone Encounter (Signed)
Patient states that as her prescriptions come up for renewal she would like 90 day scripts.  Patient is also requesting script for hydrocodone.  Patient is also requesting a call back to speak about something important.

## 2015-10-27 NOTE — Telephone Encounter (Signed)
Rx for hydrocodone printed and signed.

## 2015-10-30 ENCOUNTER — Encounter: Payer: Self-pay | Admitting: Internal Medicine

## 2015-11-16 ENCOUNTER — Telehealth: Payer: Self-pay | Admitting: *Deleted

## 2015-11-16 MED ORDER — DULOXETINE HCL 60 MG PO CPEP: 60.0000 mg | ORAL_CAPSULE | Freq: Every day | ORAL | Status: DC

## 2015-11-16 MED ORDER — LISINOPRIL 10 MG PO TABS: 10.0000 mg | ORAL_TABLET | Freq: Every day | ORAL | Status: DC

## 2015-11-16 MED ORDER — OMEPRAZOLE 20 MG PO CPDR: 20.0000 mg | DELAYED_RELEASE_CAPSULE | Freq: Every day | ORAL | Status: DC

## 2015-11-16 MED ORDER — ATENOLOL 50 MG PO TABS: 50.0000 mg | ORAL_TABLET | Freq: Every day | ORAL | Status: DC

## 2015-11-16 MED ORDER — SIMVASTATIN 20 MG PO TABS: 20.0000 mg | ORAL_TABLET | Freq: Every day | ORAL | Status: DC

## 2015-11-16 MED ORDER — APIXABAN 5 MG PO TABS: ORAL_TABLET | ORAL | Status: DC

## 2015-11-16 MED ORDER — METFORMIN HCL 500 MG PO TABS: 500.0000 mg | ORAL_TABLET | Freq: Two times a day (BID) | ORAL | Status: DC

## 2015-11-16 MED ORDER — FUROSEMIDE 40 MG PO TABS: 40.0000 mg | ORAL_TABLET | Freq: Every day | ORAL | Status: DC | PRN

## 2015-11-16 MED ORDER — LORATADINE 10 MG PO TABS: 10.0000 mg | ORAL_TABLET | Freq: Every day | ORAL | Status: DC | PRN

## 2015-11-16 NOTE — Telephone Encounter (Signed)
Received call pt states she received letter from insurance and due to insurance she must get 90 on meds. Almost out of her diabetic med wanting 90 [facility]. Inform pt will send to pharmacy....[name]

## 2015-11-18 DIAGNOSIS — R32 Unspecified urinary incontinence: Secondary | ICD-10-CM | POA: Diagnosis not present

## 2015-11-25 ENCOUNTER — Telehealth: Payer: Self-pay | Admitting: Internal Medicine

## 2015-11-25 MED ORDER — HYDROCODONE-ACETAMINOPHEN 5-325 MG PO TABS: 1.0000 | ORAL_TABLET | Freq: Two times a day (BID) | ORAL | Status: DC | PRN

## 2015-11-25 NOTE — Telephone Encounter (Signed)
Patient is requesting a refill for HYDROcodone-acetaminophen (NORCO/VICODIN) 5-325 MG tablet [id number]

## 2015-11-25 NOTE — Telephone Encounter (Signed)
Printed and signed. Placed in front cabinet. Rx is ready, patient aware.

## 2015-12-02 ENCOUNTER — Telehealth: Payer: Self-pay | Admitting: Internal Medicine

## 2015-12-02 NOTE — Telephone Encounter (Signed)
Is requesting last A1C an BP reading for patient.

## 2015-12-08 ENCOUNTER — Telehealth: Payer: Self-pay | Admitting: *Deleted

## 2015-12-08 NOTE — Telephone Encounter (Signed)
Already done see msg from 12/13...[name]

## 2015-12-08 NOTE — Telephone Encounter (Signed)
Left msg on trigae stating pt is with the Diabetes program needing last A1C & BP results.[name] ashley back no answer LMOM on vm...[name]

## 2015-12-10 DIAGNOSIS — R32 Unspecified urinary incontinence: Secondary | ICD-10-CM | POA: Diagnosis not present

## 2015-12-14 ENCOUNTER — Encounter: Payer: Self-pay | Admitting: Obstetrics

## 2015-12-14 ENCOUNTER — Ambulatory Visit (INDEPENDENT_AMBULATORY_CARE_PROVIDER_SITE_OTHER): Payer: Medicare Other | Admitting: Obstetrics

## 2015-12-14 VITALS — BP 124/79 | HR 64 | Temp 97.9°F | Wt 221.0 lb

## 2015-12-14 DIAGNOSIS — N764 Abscess of vulva: Secondary | ICD-10-CM

## 2015-12-14 DIAGNOSIS — R102 Pelvic and perineal pain: Secondary | ICD-10-CM | POA: Diagnosis not present

## 2015-12-14 LAB — POCT URINALYSIS DIPSTICK
Bilirubin, UA: NEGATIVE
Blood, UA: NEGATIVE
Glucose, UA: NEGATIVE
KETONES UA: NEGATIVE
LEUKOCYTES UA: NEGATIVE
NITRITE UA: NEGATIVE
PH UA: 6.5
PROTEIN UA: NEGATIVE
Urobilinogen, UA: NEGATIVE

## 2015-12-14 MED ORDER — HYDROCODONE-ACETAMINOPHEN 5-325 MG PO TABS: 1.0000 | ORAL_TABLET | ORAL | Status: DC | PRN

## 2015-12-14 MED ORDER — SULFAMETHOXAZOLE-TRIMETHOPRIM 800-160 MG PO TABS: 1.0000 | ORAL_TABLET | Freq: Two times a day (BID) | ORAL | Status: DC

## 2015-12-14 NOTE — Progress Notes (Signed)
Patient ID: [name], female   DOB: Jan 04, 1941, 74 y.o.   MRN: [id number]  Chief Complaint  Patient presents with  . Pelvic Pain    pt states she is having some vaginal pain, needle like pain x 1 week.   pt states no bleeding or d/c    HPI [name] is a 74 y.o. female.  Vaginal pain and a bump on vulva that is painful.  HPI  Past Medical History  Diagnosis Date  . Stroke ([place])   . Acute MI ([place])   . Asthma   . Diabetes mellitus   . Arthritis   . Gout     Past Surgical History  Procedure Laterality Date  . Joint replacement    . Back surgery Left 2003    left knee replacement and left shoulder    Family History  Problem Relation Age of Onset  . Cancer Brother   . Cancer Mother     Social History Social History  Substance Use Topics  . Smoking status: Former Smoker -- 0.25 packs/day for 30 years    Types: Cigarettes  . Smokeless tobacco: Former Systems developer    Quit date: 06/13/2004     Comment: Quit when she went to nursing home/ states she light smoker; Only smoked periodically; Pack years undetermined  . Alcohol Use: 0.6 oz/week    1 Standard drinks or equivalent per week     Comment: Occasinaly beer with dinner    Allergies  Allergen Reactions  . Penicillins Other (See Comments)    unknown    Current Outpatient Prescriptions  Medication Sig Dispense Refill  . albuterol (VENTOLIN HFA) 108 (90 BASE) MCG/ACT inhaler Inhale 2 puffs into the lungs every 6 (six) hours as needed for wheezing or shortness of breath (SOB).    Marland Kitchen allopurinol (ZYLOPRIM) 100 MG tablet Take 200 mg by mouth daily.    Marland Kitchen apixaban (ELIQUIS) 5 MG TABS tablet TAKE 1 TABLET (5 MG TOTAL) BY MOUTH TWO   (TWO) TIMES DAILY. 180 tablet 0  . atenolol (TENORMIN) 50 MG tablet Take 1 tablet (50 mg total) by mouth daily. 90 tablet 0  . DULoxetine (CYMBALTA) 60 MG capsule Take 1 capsule (60 mg total) by mouth daily. 90 capsule 0  . Fluticasone-Salmeterol (ADVAIR) 100-50 MCG/DOSE AEPB  Inhale 1 puff into the lungs 2 (two) times daily.    . furosemide (LASIX) 40 MG tablet Take 1 tablet (40 mg total) by mouth daily as needed (leg swelling). 90 tablet 0  . HYDROcodone-acetaminophen (NORCO/VICODIN) 5-325 MG tablet Take 1-2 tablets by mouth every 4 (four) hours as needed for moderate pain. 40 tablet 0  . lisinopril (PRINIVIL,ZESTRIL) 10 MG tablet Take 1 tablet (10 mg total) by mouth daily. 90 tablet 0  . loratadine (CLARITIN) 10 MG tablet Take 1 tablet (10 mg total) by mouth daily as needed for allergies (one tab daily by mouth as needed). 90 tablet 0  . metFORMIN (GLUCOPHAGE) 500 MG tablet Take 1 tablet (500 mg total) by mouth 2 (two) times daily with a meal. 180 tablet 0  . omeprazole (PRILOSEC) 20 MG capsule Take 1 capsule (20 mg total) by mouth daily. 90 capsule 0  . simvastatin (ZOCOR) 20 MG tablet Take 1 tablet (20 mg total) by mouth daily. 90 tablet 0  . sulfamethoxazole-trimethoprim (BACTRIM DS,SEPTRA DS) 800-160 MG tablet Take 1 tablet by mouth 2 (two) times daily. 14 tablet 1   No current facility-administered medications for this visit.  Review of Systems Review of Systems Constitutional: negative for fatigue and weight loss Respiratory: negative for cough and wheezing Cardiovascular: negative for chest pain, fatigue and palpitations Gastrointestinal: negative for abdominal pain and change in bowel habits Genitourinary: positive for vaginal pain and bump on vulva Integument/breast: negative for nipple discharge Musculoskeletal:negative for myalgias Neurological: negative for gait problems and tremors Behavioral/Psych: negative for abusive relationship, depression Endocrine: negative for temperature intolerance     Blood pressure 124/79, pulse 64, temperature 97.9 F (36.6 C), weight 221 lb (100.245 kg).  Physical Exam Physical Exam General:   alert  Skin:   no rash or abnormalities  Lungs:   clear to auscultation bilaterally  Heart:   regular rate and  rhythm, S1, S2 normal, no murmur, click, rub or gallop  Breasts:   normal without suspicious masses, skin or nipple changes or axillary nodes  Abdomen:  normal findings: no organomegaly, soft, non-tender and no hernia  Pelvis:  External genitalia: caruncle left upper vulva Urinary system: urethral meatus normal and bladder without fullness, nontender Vaginal: normal without tenderness, induration or masses Cervix: normal appearance Adnexa: normal bimanual exam Uterus: anteverted and non-tender, normal size      Data Reviewed Pap results Mammogram results  Assessment     Caruncle left upper vulva Vaginal pain, probably spasm.     Plan    Bactrim DS Rx Will follow vaginal pain clinically F/U 6 months or prn. Orders Placed This Encounter  Procedures  . Bacterial Vaginosis DNA  . Candidiasis, PCR  . POCT urinalysis dipstick   Meds ordered this encounter  Medications  . sulfamethoxazole-trimethoprim (BACTRIM DS,SEPTRA DS) 800-160 MG tablet    Sig: Take 1 tablet by mouth 2 (two) times daily.    Dispense:  14 tablet    Refill:  1  . HYDROcodone-acetaminophen (NORCO/VICODIN) 5-325 MG tablet    Sig: Take 1-2 tablets by mouth every 4 (four) hours as needed for moderate pain.    Dispense:  40 tablet    Refill:  0    This is a 30 day supply.

## 2015-12-19 LAB — BACTERIAL VAGINOSIS DNA
ATOPOBIUM VAGINAE: NOT DETECTED Log (cells/mL)
Gardnerella vaginalis: 5.7 Log (cells/mL)
LACTOBACILLUS SPECIES: NOT DETECTED Log (cells/mL)
MEGASPHAERA SPECIES: NOT DETECTED Log (cells/mL)

## 2015-12-19 LAB — CANDIDIASIS, PCR
C. ALBICANS, DNA: NOT DETECTED
C. glabrata, DNA: NOT DETECTED
C. parapsilosis, DNA: NOT DETECTED
C. tropicalis, DNA: NOT DETECTED

## 2016-01-07 ENCOUNTER — Ambulatory Visit (INDEPENDENT_AMBULATORY_CARE_PROVIDER_SITE_OTHER): Payer: Medicare Other | Admitting: Internal Medicine

## 2016-01-07 ENCOUNTER — Other Ambulatory Visit (INDEPENDENT_AMBULATORY_CARE_PROVIDER_SITE_OTHER): Payer: Medicare Other

## 2016-01-07 ENCOUNTER — Encounter: Payer: Self-pay | Admitting: Internal Medicine

## 2016-01-07 VITALS — BP 112/78 | HR 73 | Temp 97.7°F | Resp 12 | Ht 64.0 in | Wt 220.0 lb

## 2016-01-07 DIAGNOSIS — E119 Type 2 diabetes mellitus without complications: Secondary | ICD-10-CM

## 2016-01-07 DIAGNOSIS — M109 Gout, unspecified: Secondary | ICD-10-CM | POA: Diagnosis not present

## 2016-01-07 DIAGNOSIS — G894 Chronic pain syndrome: Secondary | ICD-10-CM | POA: Diagnosis not present

## 2016-01-07 LAB — COMPREHENSIVE METABOLIC PANEL
ALK PHOS: 96 U/L (ref 39–117)
ALT: 10 U/L (ref 0–35)
AST: 16 U/L (ref 0–37)
Albumin: 4.2 g/dL (ref 3.5–5.2)
BUN: 25 mg/dL — AB (ref 6–23)
CHLORIDE: 103 meq/L (ref 96–112)
CO2: 32 mEq/L (ref 19–32)
Calcium: 9.6 mg/dL (ref 8.4–10.5)
Creatinine, Ser: 0.98 mg/dL (ref 0.40–1.20)
GFR: 71.41 mL/min (ref >60.00)
GLUCOSE: 105 mg/dL — AB (ref 70–99)
POTASSIUM: 3.9 meq/L (ref 3.5–5.1)
SODIUM: 143 meq/L (ref 135–145)
Total Bilirubin: 0.5 mg/dL (ref 0.2–1.2)
Total Protein: 6.9 g/dL (ref 6.0–8.3)

## 2016-01-07 LAB — HEMOGLOBIN A1C: HEMOGLOBIN A1C: 6.4 % (ref 4.6–6.5)

## 2016-01-07 LAB — URIC ACID: URIC ACID, SERUM: 9.9 mg/dL — AB (ref 2.4–7.0)

## 2016-01-07 MED ORDER — OMEPRAZOLE 20 MG PO CPDR: 20.0000 mg | DELAYED_RELEASE_CAPSULE | Freq: Every day | ORAL | Status: DC

## 2016-01-07 MED ORDER — LORATADINE 10 MG PO TABS: 10.0000 mg | ORAL_TABLET | Freq: Every day | ORAL | Status: DC | PRN

## 2016-01-07 MED ORDER — FUROSEMIDE 40 MG PO TABS: 40.0000 mg | ORAL_TABLET | Freq: Every day | ORAL | Status: DC | PRN

## 2016-01-07 MED ORDER — APIXABAN 5 MG PO TABS: ORAL_TABLET | ORAL | Status: DC

## 2016-01-07 MED ORDER — ATENOLOL 50 MG PO TABS: 50.0000 mg | ORAL_TABLET | Freq: Every day | ORAL | Status: DC

## 2016-01-07 MED ORDER — SIMVASTATIN 20 MG PO TABS: 20.0000 mg | ORAL_TABLET | Freq: Every day | ORAL | Status: DC

## 2016-01-07 MED ORDER — LISINOPRIL 10 MG PO TABS: 10.0000 mg | ORAL_TABLET | Freq: Every day | ORAL | Status: DC

## 2016-01-07 MED ORDER — METFORMIN HCL 500 MG PO TABS: 500.0000 mg | ORAL_TABLET | Freq: Every day | ORAL | Status: DC

## 2016-01-07 MED ORDER — DULOXETINE HCL 60 MG PO CPEP: 60.0000 mg | ORAL_CAPSULE | Freq: Every day | ORAL | Status: DC

## 2016-01-07 NOTE — Progress Notes (Signed)
Pre visit review using our clinic review tool, if applicable. No additional management support is needed unless otherwise documented below in the visit note. 

## 2016-01-07 NOTE — Patient Instructions (Signed)
We have sent in the 90 day refills of the medicines for you today.  We want to check the blood work to make sure that the sugars are still doing well.   Remember that you are not allowed to get your pain medicine from other doctors per our contract (you got them from the Ob/Gyn last month). If you are getting pain medicine for another condition you need to call and let us know.   Diabetes and Standards of Medical Care Diabetes is complicated. You may find that your diabetes team includes a dietitian, nurse, diabetes educator, eye doctor, and more. To help everyone know what is going on and to help you get the care you deserve, the following schedule of care was developed to help keep you on track. Below are the tests, exams, vaccines, medicines, education, and plans you will need. HbA1c test This test shows how well you have controlled your glucose over the past 2-3 months. It is used to see if your diabetes management plan needs to be adjusted.   It is performed at least 2 times a year if you are meeting treatment goals.  It is performed 4 times a year if therapy has changed or if you are not meeting treatment goals. Blood pressure test  This test is performed at every routine medical visit. The goal is less than 140/90 mm Hg for most people, but 130/80 mm Hg in some cases. Ask your health care provider about your goal. Dental exam  Follow up with the dentist regularly. Eye exam  If you are diagnosed with type 1 diabetes as a child, get an exam upon reaching the age of 36 years or older and having had diabetes for 3-5 years. Yearly eye exams are recommended after that initial eye exam.  If you are diagnosed with type 1 diabetes as an adult, get an exam within 5 years of diagnosis and then yearly.  If you are diagnosed with type 2 diabetes, get an exam as soon as possible after the diagnosis and then yearly. Foot care exam  Visual foot exams are performed at every routine medical visit.  The exams check for cuts, injuries, or other problems with the feet.  You should have a complete foot exam performed every year. This exam includes an inspection of the structure and skin of your feet, a check of the pulses in your feet, and a check of the sensation in your feet.  Type 1 diabetes: The first exam is performed 5 years after diagnosis.  Type 2 diabetes: The first exam is performed at the time of diagnosis.  Check your feet nightly for cuts, injuries, or other problems with your feet. Tell your health care provider if anything is not healing. Kidney function test (urine microalbumin)  This test is performed once a year.  Type 1 diabetes: The first test is performed 5 years after diagnosis.  Type 2 diabetes: The first test is performed at the time of diagnosis.  A serum creatinine and estimated glomerular filtration rate (eGFR) test is done once a year to assess the level of chronic kidney disease (CKD), if present. Lipid profile (cholesterol, HDL, LDL, triglycerides)  Performed every 5 years for most people.  The goal for LDL is less than 100 mg/dL. If you are at high risk, the goal is less than 70 mg/dL.  The goal for HDL is 40 mg/dL-50 mg/dL for men and 50 mg/dL-60 mg/dL for women. An HDL cholesterol of 60 mg/dL or higher gives  some protection against heart disease.  The goal for triglycerides is less than 150 mg/dL. Immunizations  The flu (influenza) vaccine is recommended yearly for every person 42 months of age or older who has diabetes.  The pneumonia (pneumococcal) vaccine is recommended for every person 30 years of age or older who has diabetes. Adults 32 years of age or older may receive the pneumonia vaccine as a series of two separate shots.  The hepatitis B vaccine is recommended for adults shortly after they have been diagnosed with diabetes.  The Tdap (tetanus, diphtheria, and pertussis) vaccine should be given:  According to normal childhood vaccination  schedules, for children.  Every 10 years, for adults who have diabetes. Diabetes self-management education  Education is recommended at diagnosis and ongoing as needed. Treatment plan  Your treatment plan is reviewed at every medical visit.   This information is not intended to replace advice given to you by your health care provider. Make sure you discuss any questions you have with your health care provider.   Document Released: 10/09/2009 Document Revised: 01/02/2015 Document Reviewed: 05/14/2013 Elsevier Interactive Patient Education [employer].

## 2016-01-08 ENCOUNTER — Other Ambulatory Visit: Payer: Self-pay | Admitting: Internal Medicine

## 2016-01-08 MED ORDER — ALLOPURINOL 100 MG PO TABS: 100.0000 mg | ORAL_TABLET | Freq: Every day | ORAL | Status: DC

## 2016-01-08 NOTE — Progress Notes (Signed)
   Subjective:    Patient ID: [name], female    DOB: 1941-01-24, 75 y.o.   MRN: [id number]  HPI The patient is a 75 YO female coming in for follow up of her chronic pain. She is still having arthritis pain and uses her pain medicine. She does not mention to me that she has gotten narcotics from her Ob/Gyn at their last visit. This is an increase to her prior medication from [place]. She does need refills on her medicines. She denies increase in pain. The medicine helps her to be functional.   Review of Systems  Constitutional: Positive for activity change. Negative for fever, appetite change, fatigue and unexpected weight change.  HENT: Negative.   Eyes: Negative.   Respiratory: Negative for cough, chest tightness, shortness of breath and wheezing.   Cardiovascular: Positive for leg swelling. Negative for chest pain and palpitations.  Gastrointestinal: Negative for abdominal pain, diarrhea, constipation and abdominal distention.  Genitourinary: Negative.   Musculoskeletal: Positive for myalgias, back pain, arthralgias and gait problem.  Skin: Negative for rash and wound.  Neurological: Negative for dizziness, speech difficulty, weakness and light-headedness.       Gait imbalance  Psychiatric/Behavioral: Positive for dysphoric mood and decreased concentration.      Objective:   Physical Exam  Constitutional: She appears well-developed.  Not well kempt, overweight  HENT:  Head: Normocephalic and atraumatic.  Eyes: EOM are normal.  Neck: Normal range of motion.  Cardiovascular: Normal rate.   Pulmonary/Chest: Effort normal and breath sounds normal. No respiratory distress. She has no wheezes. She has no rales.  Abdominal: Soft. Bowel sounds are normal.  Musculoskeletal: She exhibits no edema.  Neurological: She is alert. Coordination abnormal.  Uses cane  Skin: Skin is warm and dry.  Psychiatric:  Poor historian.    Filed Vitals:   01/07/16 0902  BP: 112/78  Pulse: 73   Temp: 97.7 F (36.5 C)  TempSrc: Oral  Resp: 12  Height: 5\' 4"  (1.626 m)  Weight: 220 lb (99.791 kg)  SpO2: 91%      Assessment & Plan:

## 2016-01-08 NOTE — Assessment & Plan Note (Signed)
She is on pain contract and is not to get narcotics from other providers. We did remind her of this at today's visit. She then asked for a refill after the visit which was denied. She was given 40 hydrocodone on 12/25/15 from her ob/gyn and her monthly allowance is 30 pills. She also got rx from [place] on 12/14/15 so she should not be out of medicine at this time. Next appropriate refill would be 02/08/16 since she was given extra medication and early fill. She is reminded that if this happens again she would be in clear violation of our policy and we would not be able to continue to give her narcotic medicine even though it helps.

## 2016-01-08 NOTE — Assessment & Plan Note (Signed)
Taking metformin 500 mg daily, checking HgA1c and on ACE-I and statin. Foot exam done.

## 2016-01-25 ENCOUNTER — Telehealth: Payer: Self-pay | Admitting: Internal Medicine

## 2016-01-25 DIAGNOSIS — R102 Pelvic and perineal pain: Secondary | ICD-10-CM

## 2016-01-25 MED ORDER — HYDROCODONE-ACETAMINOPHEN 5-325 MG PO TABS: 1.0000 | ORAL_TABLET | Freq: Every day | ORAL | Status: DC | PRN

## 2016-01-25 NOTE — Telephone Encounter (Signed)
Printed and signed, please UDS with visit

## 2016-01-25 NOTE — Telephone Encounter (Signed)
Patient requesting a refill of HYDROcodone-acetaminophen (NORCO/VICODIN) 5-325 MG tablet [id number]

## 2016-01-25 NOTE — Telephone Encounter (Signed)
Patient aware that she has an rx at the front ready to be picked up.

## 2016-01-28 ENCOUNTER — Other Ambulatory Visit: Payer: Self-pay | Admitting: Obstetrics

## 2016-02-01 ENCOUNTER — Telehealth: Payer: Self-pay | Admitting: Internal Medicine

## 2016-02-01 NOTE — Telephone Encounter (Signed)
Pt called in and needs:  Pad to put in bed  pullups gloves

## 2016-02-02 NOTE — Telephone Encounter (Signed)
Spoke to patient and she said she needs extra large pull ups, pads, gloves, and wipes from [facility]. She said she called them and they said she needs a referral. She says she uses them for home health.

## 2016-02-02 NOTE — Telephone Encounter (Signed)
Prescription given to [name] please fax to [facility].

## 2016-02-03 NOTE — Telephone Encounter (Signed)
Faxed to [facility]. Patient aware.

## 2016-02-09 ENCOUNTER — Other Ambulatory Visit: Payer: Self-pay

## 2016-02-09 DIAGNOSIS — I213 ST elevation (STEMI) myocardial infarction of unspecified site: Secondary | ICD-10-CM | POA: Diagnosis not present

## 2016-02-09 DIAGNOSIS — M159 Polyosteoarthritis, unspecified: Secondary | ICD-10-CM | POA: Diagnosis not present

## 2016-02-09 DIAGNOSIS — R32 Unspecified urinary incontinence: Secondary | ICD-10-CM | POA: Diagnosis not present

## 2016-02-09 DIAGNOSIS — M539 Dorsopathy, unspecified: Secondary | ICD-10-CM | POA: Diagnosis not present

## 2016-02-09 DIAGNOSIS — Z1231 Encounter for screening mammogram for malignant neoplasm of breast: Secondary | ICD-10-CM

## 2016-02-22 ENCOUNTER — Telehealth: Payer: Self-pay | Admitting: Internal Medicine

## 2016-02-22 DIAGNOSIS — R102 Pelvic and perineal pain: Secondary | ICD-10-CM

## 2016-02-22 MED ORDER — HYDROCODONE-ACETAMINOPHEN 5-325 MG PO TABS: 1.0000 | ORAL_TABLET | Freq: Every day | ORAL | Status: DC | PRN

## 2016-02-22 NOTE — Telephone Encounter (Signed)
Last refill was 01/25/16.

## 2016-02-22 NOTE — Telephone Encounter (Signed)
Pt is requesting refill for HYDROcodone-acetaminophen (NORCO/VICODIN) 5-325 MG tablet [id number]  Can you please give her a call at [phone] to let her know when she can come pick it up

## 2016-02-22 NOTE — Telephone Encounter (Signed)
Patient aware. Placed in cabinet up front.  

## 2016-02-22 NOTE — Telephone Encounter (Signed)
Printed and signed.  

## 2016-02-23 ENCOUNTER — Telehealth: Payer: Self-pay

## 2016-02-23 NOTE — Telephone Encounter (Signed)
[facility] application received and placed on MD's desk for signature

## 2016-02-24 NOTE — Telephone Encounter (Signed)
Paperwork signed, copy sent to scan, original placed in cabinet for pt pick up. Pt advised via personal VM

## 2016-03-08 ENCOUNTER — Ambulatory Visit: Payer: Medicare Other | Admitting: Obstetrics

## 2016-03-08 ENCOUNTER — Telehealth: Payer: Self-pay | Admitting: Internal Medicine

## 2016-03-08 NOTE — Telephone Encounter (Signed)
Has she seen an orthopedic or back doctor. They generally can advise on whether a brace would help with pain.

## 2016-03-08 NOTE — Telephone Encounter (Signed)
Can you please call pt regarding patients medications. She states [facility] can get her medicine for 90 days. She says she hasn't gotten her medicine yet.  She also said something regarding a knee and back brace.  She can be reached at [phone]

## 2016-03-08 NOTE — Telephone Encounter (Signed)
Patient says she needs a back and knee brace. Based on previous office visits, do you think she needs one. I told her I would ask you and get back with her on whether or not she needs an appt first. Please advise, thanks.

## 2016-03-09 NOTE — Telephone Encounter (Signed)
Left message for patient to call back  

## 2016-03-13 IMAGING — CR DG CHEST 2V
2 series · 2 of 2 positions shown · non-contrast
Comparison: PA and lateral chest 09/08/2011.

CLINICAL DATA: Chest pain. Right leg redness and swelling for 2
months.

EXAM:
CHEST  2 VIEW

[w chest pa]
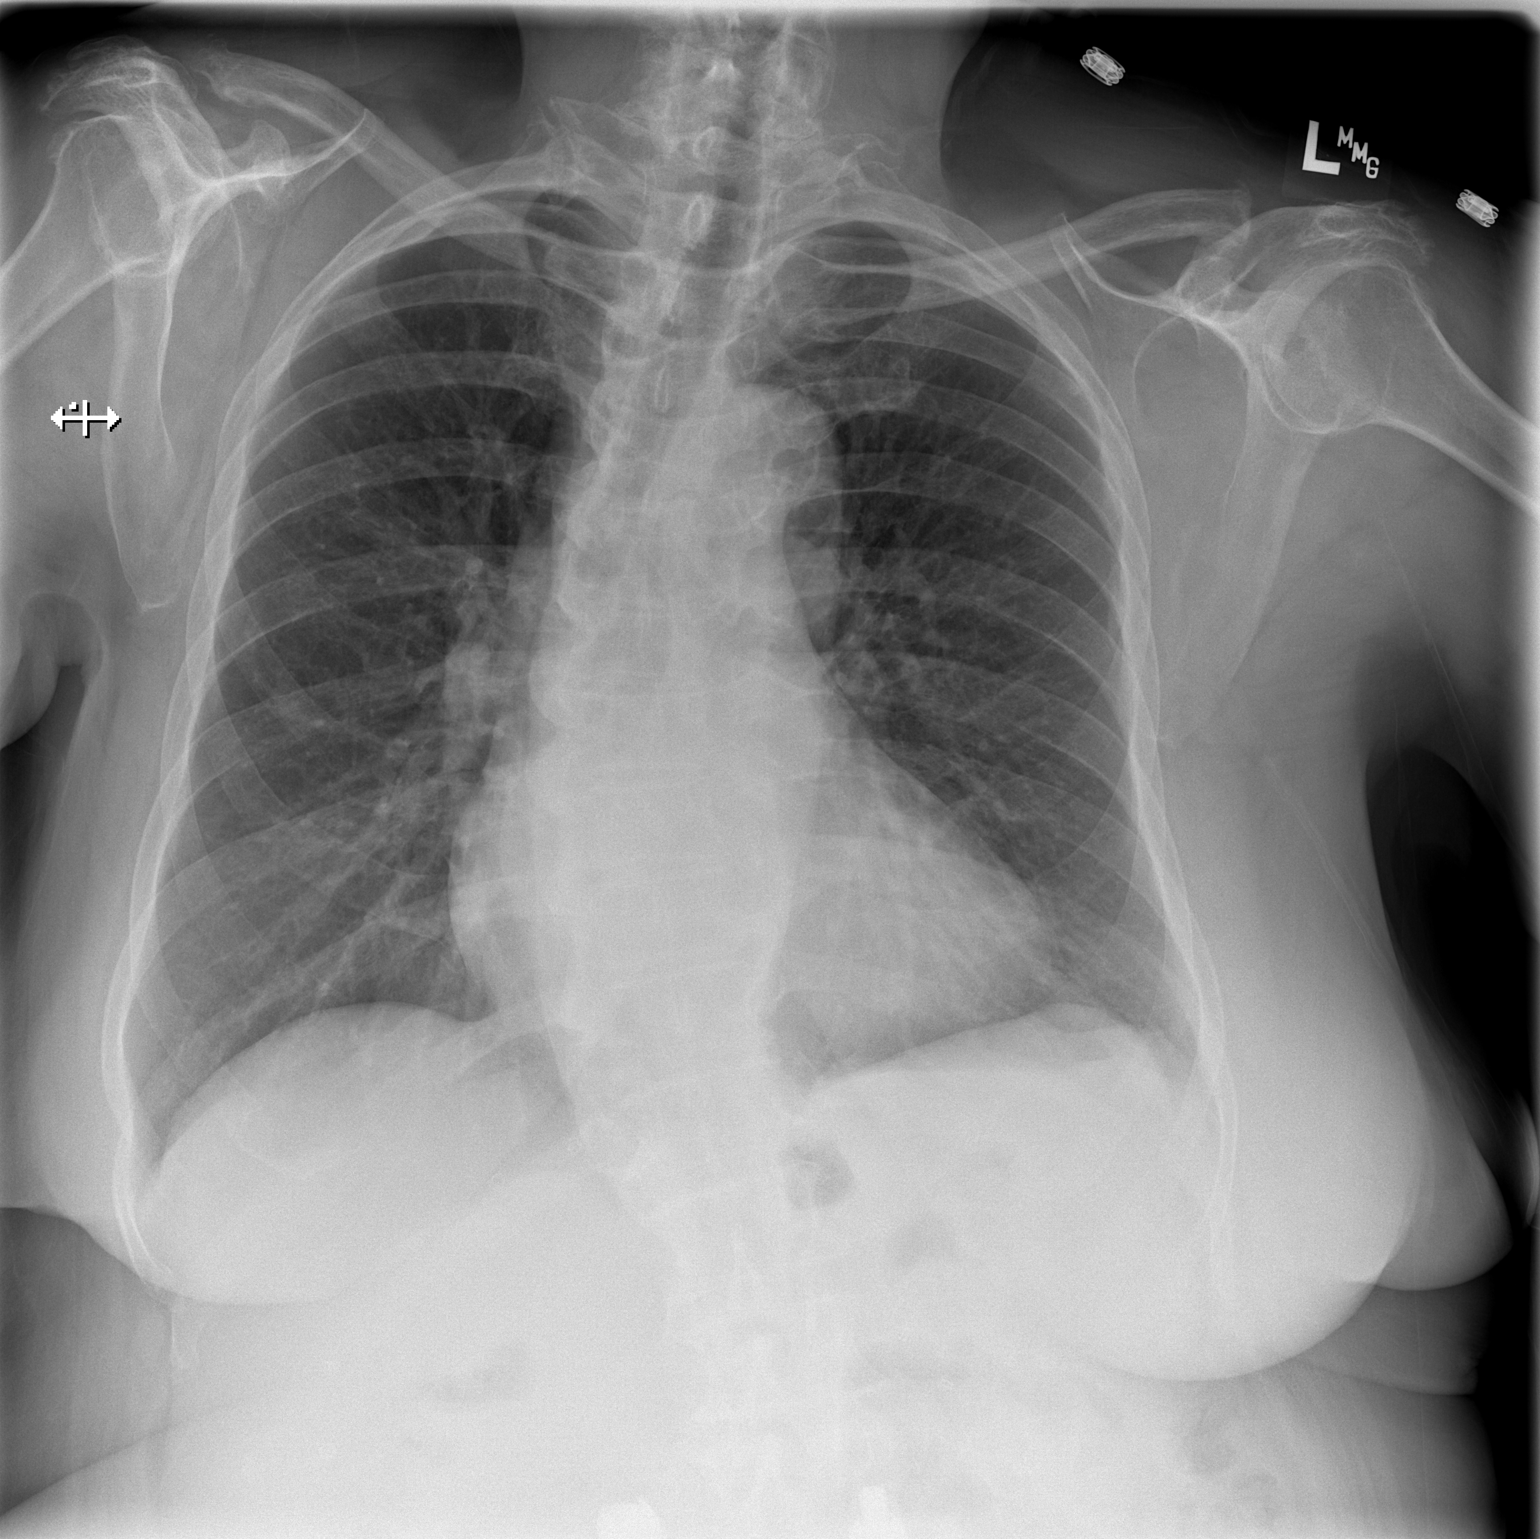

[w chest lat]
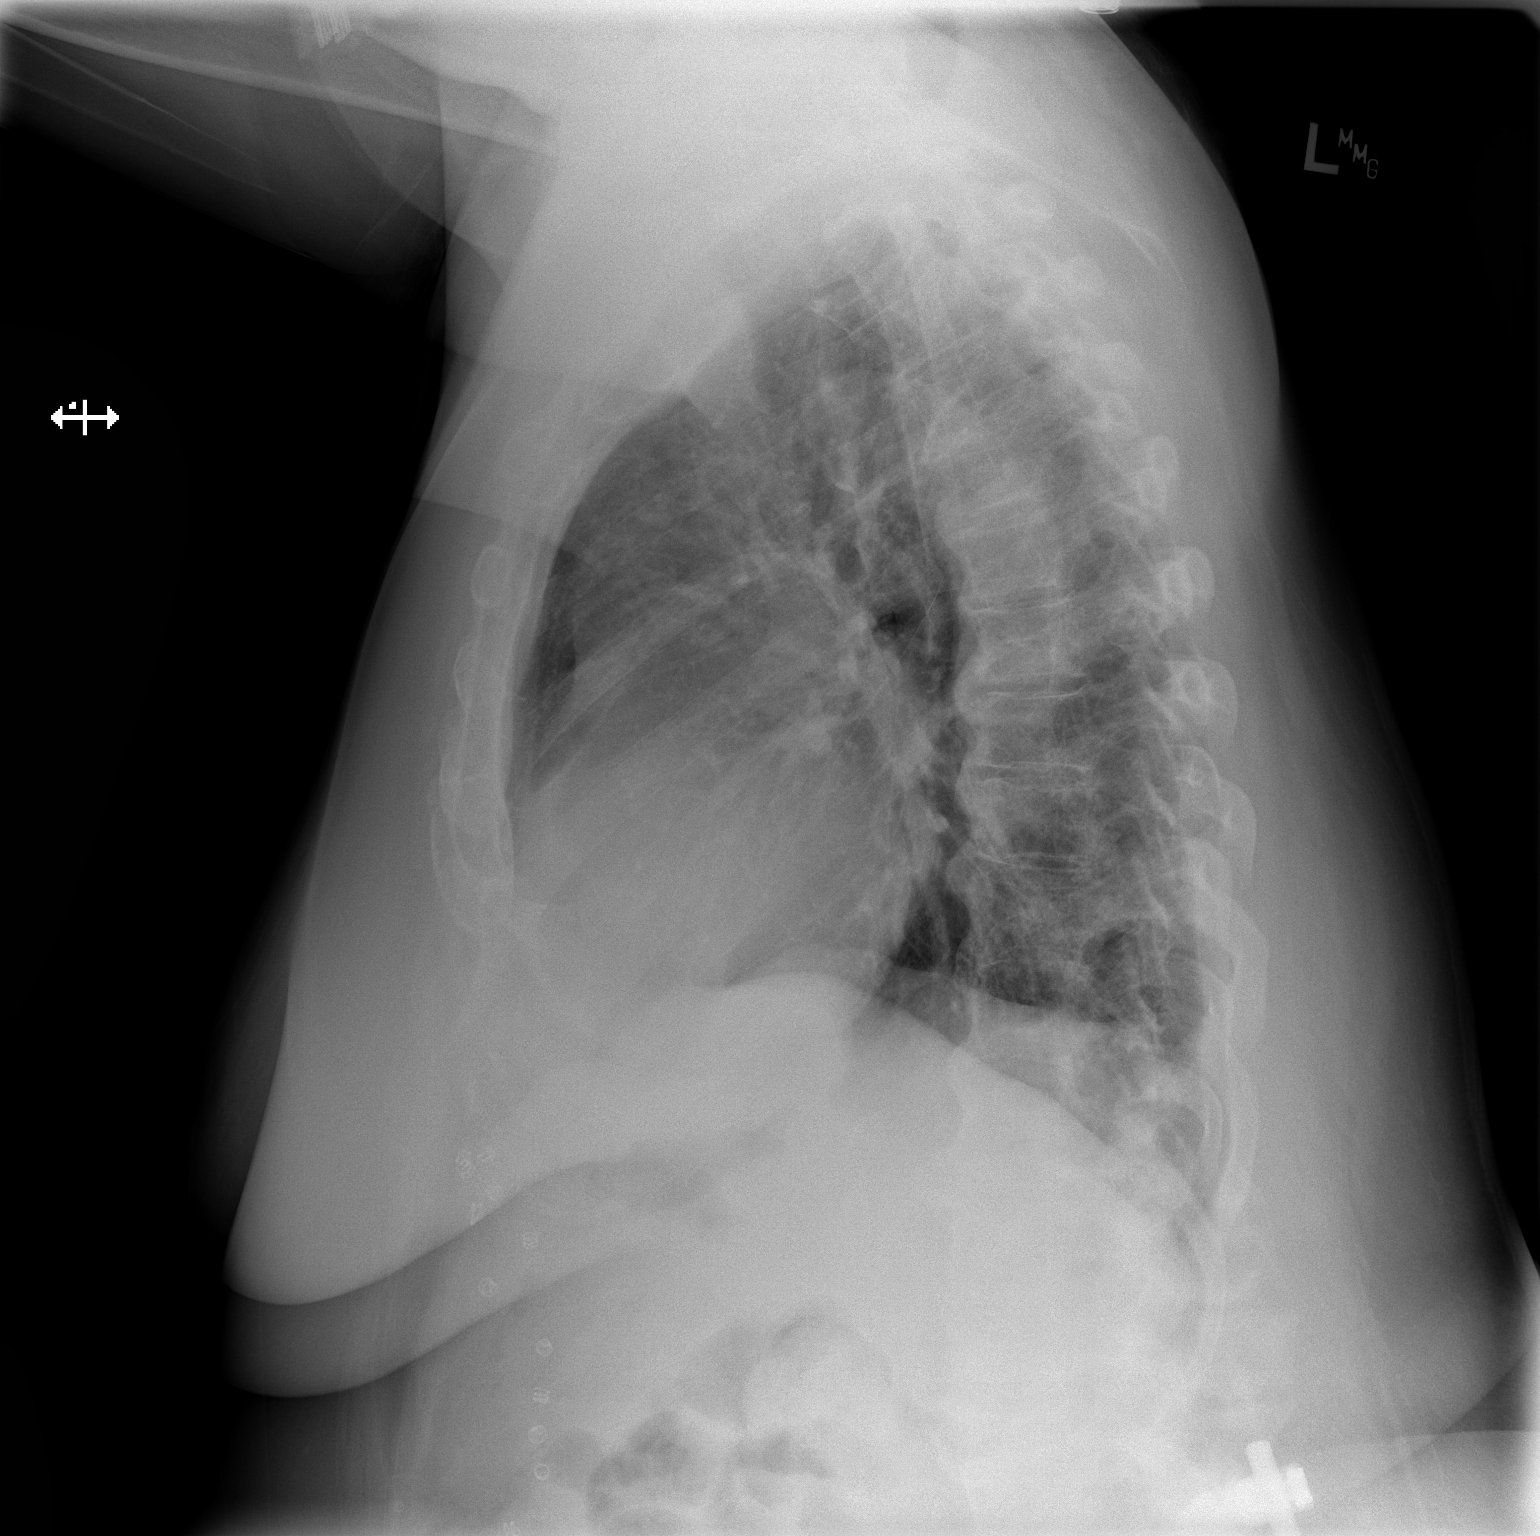

[2 of 2 positions shown; findings below may reference images not displayed]

FINDINGS: Heart size is upper normal. The lungs are clear. No pneumothorax or
pleural effusion. Scoliosis again seen.
IMPRESSION: No acute disease.  Stable compared to prior exam.

## 2016-03-16 ENCOUNTER — Ambulatory Visit
Admission: RE | Admit: 2016-03-16 | Discharge: 2016-03-16 | Disposition: A | Discharge status: Home / Self Care | Payer: Medicare Other | Source: Ambulatory Visit

## 2016-03-16 DIAGNOSIS — Z1231 Encounter for screening mammogram for malignant neoplasm of breast: Secondary | ICD-10-CM | POA: Diagnosis not present

## 2016-03-21 ENCOUNTER — Telehealth: Payer: Self-pay | Admitting: *Deleted

## 2016-03-21 DIAGNOSIS — L989 Disorder of the skin and subcutaneous tissue, unspecified: Secondary | ICD-10-CM

## 2016-03-21 NOTE — Telephone Encounter (Signed)
Received call pt states when she saw md back in Jan she showed her the spots on her legs & how dry her skins is. MD was suppose to have sent her to see dermatologist, but haven't  Heard back regarding referral..also was saying she needed 90 days on her medications and she was running low. Inform pt MD sent all new scripts to [facility] when she saw her back in January. For pt to f/u w/pharmacy...Johny Chess

## 2016-03-21 NOTE — Telephone Encounter (Signed)
Referral placed.

## 2016-03-21 NOTE — Telephone Encounter (Signed)
Notified pt referral has been place.Once appt get set-up will receive call w/appt, date, and time.../l b

## 2016-03-25 ENCOUNTER — Telehealth: Payer: Self-pay | Admitting: *Deleted

## 2016-03-25 DIAGNOSIS — R102 Pelvic and perineal pain: Secondary | ICD-10-CM

## 2016-03-25 NOTE — Telephone Encounter (Signed)
Left msg on triage needing refill on her hydrocodone. MD out of office pls advise on refill...Johny Chess

## 2016-03-25 NOTE — Telephone Encounter (Signed)
OK to fill this prescription #30 with additional refills x0 [facility] w/PCP Thank you!

## 2016-03-28 MED ORDER — HYDROCODONE-ACETAMINOPHEN 5-325 MG PO TABS: 1.0000 | ORAL_TABLET | Freq: Every day | ORAL | Status: DC | PRN

## 2016-03-28 NOTE — Telephone Encounter (Signed)
Notified pt w/MD response.../lmb 

## 2016-04-01 ENCOUNTER — Telehealth: Payer: Self-pay

## 2016-04-01 NOTE — Telephone Encounter (Signed)
Ms. [name] had AWV last your 4/12 and is scheduled this year 4/19;  Conflict with doing AWV with CPE but can do it prior if the patient can come in. Left VM for call back

## 2016-04-13 ENCOUNTER — Encounter: Payer: Self-pay | Admitting: Internal Medicine

## 2016-04-13 ENCOUNTER — Ambulatory Visit (INDEPENDENT_AMBULATORY_CARE_PROVIDER_SITE_OTHER): Payer: Medicare Other | Admitting: Internal Medicine

## 2016-04-13 VITALS — BP 128/60 | HR 76 | Temp 98.4°F | Resp 14 | Ht 64.0 in | Wt 219.0 lb

## 2016-04-13 DIAGNOSIS — M150 Primary generalized (osteo)arthritis: Secondary | ICD-10-CM

## 2016-04-13 DIAGNOSIS — M159 Polyosteoarthritis, unspecified: Secondary | ICD-10-CM

## 2016-04-13 DIAGNOSIS — M25559 Pain in unspecified hip: Secondary | ICD-10-CM | POA: Diagnosis not present

## 2016-04-13 NOTE — Progress Notes (Signed)
Pre visit review using our clinic review tool, if applicable. No additional management support is needed unless otherwise documented below in the visit note. 

## 2016-04-13 NOTE — Patient Instructions (Addendum)
We can increase the vicodin to 45 pills per month and we need you to go back to the orthopedic doctor as they can decide if a brace will help with the pain or if there are other options.   You have a visit with the dermatologist and we have given you a reminder on the 25th of this month so remember to go.

## 2016-04-14 ENCOUNTER — Telehealth: Payer: Self-pay | Admitting: Internal Medicine

## 2016-04-14 DIAGNOSIS — R32 Unspecified urinary incontinence: Secondary | ICD-10-CM | POA: Diagnosis not present

## 2016-04-14 DIAGNOSIS — I213 ST elevation (STEMI) myocardial infarction of unspecified site: Secondary | ICD-10-CM | POA: Diagnosis not present

## 2016-04-14 DIAGNOSIS — M159 Polyosteoarthritis, unspecified: Secondary | ICD-10-CM | POA: Diagnosis not present

## 2016-04-14 DIAGNOSIS — M539 Dorsopathy, unspecified: Secondary | ICD-10-CM | POA: Diagnosis not present

## 2016-04-14 NOTE — Telephone Encounter (Signed)
Pt  states she hasn't received her supplies for  Pull ups Pads for the bed And gloves  She needs the XX Large size  [facility]

## 2016-04-15 NOTE — Progress Notes (Signed)
   Subjective:    Patient ID: [name], female    DOB: Nov 01, 1941, 75 y.o.   MRN: [id number]  HPI The patient is a 75 YO female coming in for back and hip pain. She has been struggling with this for some time. Has not been back to the orthopedic doctor despite my recommendations. No new numbness or weakness. No injury to the area recently.   Review of Systems  Constitutional: Positive for activity change. Negative for fever, appetite change, fatigue and unexpected weight change.  Respiratory: Negative for cough, chest tightness, shortness of breath and wheezing.   Cardiovascular: Negative for chest pain and palpitations.  Gastrointestinal: Negative for abdominal pain, diarrhea, constipation and abdominal distention.  Musculoskeletal: Positive for myalgias, back pain, arthralgias and gait problem.  Skin: Negative for rash and wound.  Neurological: Negative for dizziness, speech difficulty, weakness and light-headedness.       Gait imbalance  Psychiatric/Behavioral: Positive for dysphoric mood and decreased concentration.      Objective:   Physical Exam  Constitutional: She appears well-developed.  Overweight  HENT:  Head: Normocephalic and atraumatic.  Eyes: EOM are normal.  Neck: Normal range of motion.  Cardiovascular: Normal rate.   Pulmonary/Chest: Effort normal and breath sounds normal. No respiratory distress. She has no wheezes. She has no rales.  Abdominal: Soft. Bowel sounds are normal.  Musculoskeletal: She exhibits no edema.  Neurological: She is alert. Coordination abnormal.  Uses cane  Skin: Skin is warm and dry.  Psychiatric:  Poor historian.    Filed Vitals:   04/13/16 1055  BP: 128/60  Pulse: 76  Temp: 98.4 F (36.9 C)  TempSrc: Oral  Resp: 14  Height: 5\' 4"  (1.626 m)  Weight: 219 lb (99.338 kg)  SpO2: 96%      Assessment & Plan:

## 2016-04-15 NOTE — Telephone Encounter (Signed)
Called and left a message for someone at [facility] to call me back. I sent the order weeks ago.

## 2016-04-15 NOTE — Assessment & Plan Note (Signed)
Referral placed again for orthopedic surgery for evaluation for her back and hips. She has had several surgeries in the past. She does take rare vicodin for the pain.

## 2016-04-19 DIAGNOSIS — L814 Other melanin hyperpigmentation: Secondary | ICD-10-CM | POA: Diagnosis not present

## 2016-04-19 DIAGNOSIS — L815 Leukoderma, not elsewhere classified: Secondary | ICD-10-CM | POA: Diagnosis not present

## 2016-04-19 DIAGNOSIS — L853 Xerosis cutis: Secondary | ICD-10-CM | POA: Diagnosis not present

## 2016-04-19 DIAGNOSIS — D1801 Hemangioma of skin and subcutaneous tissue: Secondary | ICD-10-CM | POA: Diagnosis not present

## 2016-04-27 ENCOUNTER — Ambulatory Visit: Payer: Medicare Other

## 2016-04-28 ENCOUNTER — Emergency Department (HOSPITAL_COMMUNITY)
Admission: EM | Admit: 2016-04-28 | Discharge: 2016-04-28 | Disposition: A | Discharge status: Home / Self Care | Payer: Medicare Other | Attending: Emergency Medicine | Admitting: Emergency Medicine

## 2016-04-28 ENCOUNTER — Ambulatory Visit (INDEPENDENT_AMBULATORY_CARE_PROVIDER_SITE_OTHER): Payer: Medicare Other

## 2016-04-28 ENCOUNTER — Emergency Department (HOSPITAL_BASED_OUTPATIENT_CLINIC_OR_DEPARTMENT_OTHER): Admit: 2016-04-28 | Discharge: 2016-04-28 | Disposition: A | Discharge status: Home / Self Care | Payer: Medicare Other

## 2016-04-28 ENCOUNTER — Encounter (HOSPITAL_COMMUNITY): Payer: Self-pay | Admitting: Emergency Medicine

## 2016-04-28 VITALS — BP 150/86 | HR 90 | Ht 64.0 in | Wt 217.0 lb

## 2016-04-28 DIAGNOSIS — Z9660 Presence of unspecified orthopedic joint implant: Secondary | ICD-10-CM | POA: Diagnosis not present

## 2016-04-28 DIAGNOSIS — M7989 Other specified soft tissue disorders: Secondary | ICD-10-CM | POA: Diagnosis not present

## 2016-04-28 DIAGNOSIS — Z79899 Other long term (current) drug therapy: Secondary | ICD-10-CM | POA: Diagnosis not present

## 2016-04-28 DIAGNOSIS — Z0000 Encounter for general adult medical examination without abnormal findings: Secondary | ICD-10-CM

## 2016-04-28 DIAGNOSIS — R2241 Localized swelling, mass and lump, right lower limb: Secondary | ICD-10-CM | POA: Insufficient documentation

## 2016-04-28 DIAGNOSIS — I4891 Unspecified atrial fibrillation: Secondary | ICD-10-CM | POA: Insufficient documentation

## 2016-04-28 DIAGNOSIS — Z7951 Long term (current) use of inhaled steroids: Secondary | ICD-10-CM | POA: Diagnosis not present

## 2016-04-28 DIAGNOSIS — E119 Type 2 diabetes mellitus without complications: Secondary | ICD-10-CM | POA: Insufficient documentation

## 2016-04-28 DIAGNOSIS — Z87891 Personal history of nicotine dependence: Secondary | ICD-10-CM | POA: Insufficient documentation

## 2016-04-28 DIAGNOSIS — M79604 Pain in right leg: Secondary | ICD-10-CM | POA: Diagnosis not present

## 2016-04-28 DIAGNOSIS — Z8673 Personal history of transient ischemic attack (TIA), and cerebral infarction without residual deficits: Secondary | ICD-10-CM | POA: Insufficient documentation

## 2016-04-28 DIAGNOSIS — Z7901 Long term (current) use of anticoagulants: Secondary | ICD-10-CM | POA: Diagnosis not present

## 2016-04-28 DIAGNOSIS — I82409 Acute embolism and thrombosis of unspecified deep veins of unspecified lower extremity: Secondary | ICD-10-CM | POA: Insufficient documentation

## 2016-04-28 DIAGNOSIS — M1990 Unspecified osteoarthritis, unspecified site: Secondary | ICD-10-CM | POA: Diagnosis not present

## 2016-04-28 DIAGNOSIS — I252 Old myocardial infarction: Secondary | ICD-10-CM | POA: Diagnosis not present

## 2016-04-28 DIAGNOSIS — R000 Tachycardia, unspecified: Secondary | ICD-10-CM | POA: Diagnosis not present

## 2016-04-28 DIAGNOSIS — J45909 Unspecified asthma, uncomplicated: Secondary | ICD-10-CM | POA: Insufficient documentation

## 2016-04-28 DIAGNOSIS — Z7984 Long term (current) use of oral hypoglycemic drugs: Secondary | ICD-10-CM | POA: Diagnosis not present

## 2016-04-28 DIAGNOSIS — M79609 Pain in unspecified limb: Secondary | ICD-10-CM | POA: Diagnosis not present

## 2016-04-28 DIAGNOSIS — Z79891 Long term (current) use of opiate analgesic: Secondary | ICD-10-CM | POA: Diagnosis not present

## 2016-04-28 DIAGNOSIS — R102 Pelvic and perineal pain: Secondary | ICD-10-CM

## 2016-04-28 HISTORY — DX: Acute embolism and thrombosis of unspecified deep veins of unspecified lower extremity: I82.409

## 2016-04-28 HISTORY — DX: Unspecified atrial fibrillation: I48.91

## 2016-04-28 MED ORDER — HYDROCODONE-ACETAMINOPHEN 5-325 MG PO TABS: 1.0000 | ORAL_TABLET | Freq: Every day | ORAL | Status: DC | PRN

## 2016-04-28 MED ORDER — APIXABAN 5 MG PO TABS
5.0000 mg | ORAL_TABLET | Freq: Once | ORAL | Status: AC
Start: 2016-04-28 — End: 2016-04-28
  Administered 2016-04-28: 5 mg via ORAL
  Filled 2016-04-28: qty 1

## 2016-04-28 NOTE — ED Notes (Signed)
Bed: [id number] Expected date:  Expected time:  Means of arrival:  Comments: Leg swelling, a-fib

## 2016-04-28 NOTE — Patient Instructions (Addendum)
[name] , Thank you for taking time to come for your Medicare Wellness Visit. I appreciate your ongoing commitment to your health goals. Please review the following plan we discussed and let me know if I can assist you in the future.   Will take BP medicine when you get home and every day Will call 911 if you feel dizzy, short of breath or have chest pain or swelling starts to increase in your right leg;     [name] will call and try to get colonoscopy report from last year, as well as eye exam for the medical record    These are the goals we discussed: Goals    . Exercise 3x per week (30 min per time)     Wants to remain independent; Cleans house to stay mobile; likes to cook;     . patient      Will learn her medicines and go to the doctor when you have "red flags" Dizziness, sob, falls, swollen leg; other issues that come up suddenly;        This is a list of the screening recommended for you and due dates:  Health Maintenance  Topic Date Due  . Eye exam for diabetics  05/11/1951  . Colon Cancer Screening  05/11/1992  . Shingles Vaccine  04/13/2017*  . Tetanus Vaccine  04/13/2017*  . Pneumonia vaccines (1 of 2 - PCV13) 04/13/2017*  . Hemoglobin A1C  07/06/2016  . Flu Shot  07/26/2016  . Complete foot exam   01/07/2017  . Mammogram  03/16/2018  . DEXA scan (bone density measurement)  Completed  *Topic was postponed. The date shown is not the original due date.     Fall Prevention in the Home  Falls can cause injuries. They can happen to people of all ages. There are many things you can do to make your home safe and to help prevent falls.  WHAT CAN I DO ON THE OUTSIDE OF MY HOME?  Regularly fix the edges of walkways and driveways and fix any cracks.  Remove anything that might make you trip as you walk through a door, such as a raised step or threshold.  Trim any bushes or trees on the path to your home.  Use bright outdoor lighting.  Clear any walking  paths of anything that might make someone trip, such as rocks or tools.  Regularly check to see if handrails are loose or broken. Make sure that both sides of any steps have handrails.  Any raised decks and porches should have guardrails on the edges.  Have any leaves, snow, or ice cleared regularly.  Use sand or salt on walking paths during winter.  Clean up any spills in your garage right away. This includes oil or grease spills. WHAT CAN I DO IN THE BATHROOM?   Use night lights.  Install grab bars by the toilet and in the tub and shower. Do not use towel bars as grab bars.  Use non-skid mats or decals in the tub or shower.  If you need to sit down in the shower, use a plastic, non-slip stool.  Keep the floor dry. Clean up any water that spills on the floor as soon as it happens.  Remove soap buildup in the tub or shower regularly.  Attach bath mats securely with double-sided non-slip rug tape.  Do not have throw rugs and other things on the floor that can make you trip. WHAT CAN I DO IN THE BEDROOM?  Use night lights.  Make sure that you have a light by your bed that is easy to reach.  Do not use any sheets or blankets that are too big for your bed. They should not hang down onto the floor.  Have a firm chair that has side arms. You can use this for support while you get dressed.  Do not have throw rugs and other things on the floor that can make you trip. WHAT CAN I DO IN THE KITCHEN?  Clean up any spills right away.  Avoid walking on wet floors.  Keep items that you use a lot in easy-to-reach places.  If you need to reach something above you, use a strong step stool that has a grab bar.  Keep electrical cords out of the way.  Do not use floor polish or wax that makes floors slippery. If you must use wax, use non-skid floor wax.  Do not have throw rugs and other things on the floor that can make you trip. WHAT CAN I DO WITH MY STAIRS?  Do not leave any  items on the stairs.  Make sure that there are handrails on both sides of the stairs and use them. Fix handrails that are broken or loose. Make sure that handrails are as long as the stairways.  Check any carpeting to make sure that it is firmly attached to the stairs. Fix any carpet that is loose or worn.  Avoid having throw rugs at the top or bottom of the stairs. If you do have throw rugs, attach them to the floor with carpet tape.  Make sure that you have a light switch at the top of the stairs and the bottom of the stairs. If you do not have them, ask someone to add them for you. WHAT ELSE CAN I DO TO HELP PREVENT FALLS?  Wear shoes that:  Do not have high heels.  Have rubber bottoms.  Are comfortable and fit you well.  Are closed at the toe. Do not wear sandals.  If you use a stepladder:  Make sure that it is fully opened. Do not climb a closed stepladder.  Make sure that both sides of the stepladder are locked into place.  Ask someone to hold it for you, if possible.  Clearly mark and make sure that you can see:  Any grab bars or handrails.  First and last steps.  Where the edge of each step is.  Use tools that help you move around (mobility aids) if they are needed. These include:  Canes.  Walkers.  Scooters.  Crutches.  Turn on the lights when you go into a dark area. Replace any light bulbs as soon as they burn out.  Set up your furniture so you have a clear path. Avoid moving your furniture around.  If any of your floors are uneven, fix them.  If there are any pets around you, be aware of where they are.  Review your medicines with your doctor. Some medicines can make you feel dizzy. This can increase your chance of falling. Ask your doctor what other things that you can do to help prevent falls.   This information is not intended to replace advice given to you by your health care provider. Make sure you discuss any questions you have with your  health care provider.   Document Released: 10/08/2009 Document Revised: 04/28/2015 Document Reviewed: 01/16/2015 Elsevier Interactive Patient Education 2016 Experiment Maintenance, Female Adopting a healthy lifestyle and  getting preventive care can go a long way to promote health and wellness. Talk with your health care provider about what schedule of regular examinations is right for you. This is a good chance for you to check in with your provider about disease prevention and staying healthy. In between checkups, there are plenty of things you can do on your own. Experts have done a lot of research about which lifestyle changes and preventive measures are most likely to keep you healthy. Ask your health care provider for more information. WEIGHT AND DIET  Eat a healthy diet  Be sure to include plenty of vegetables, fruits, low-fat dairy products, and lean protein.  Do not eat a lot of foods high in solid fats, added sugars, or salt.  Get regular exercise. This is one of the most important things you can do for your health.  Most adults should exercise for at least 150 minutes each week. The exercise should increase your heart rate and make you sweat (moderate-intensity exercise).  Most adults should also do strengthening exercises at least twice a week. This is in addition to the moderate-intensity exercise.  Maintain a healthy weight  Body mass index (BMI) is a measurement that can be used to identify possible weight problems. It estimates body fat based on height and weight. Your health care provider can help determine your BMI and help you achieve or maintain a healthy weight.  For females 99 years of age and older:   A BMI below 18.5 is considered underweight.  A BMI of 18.5 to 24.9 is normal.  A BMI of 25 to 29.9 is considered overweight.  A BMI of 30 and above is considered obese.  Watch levels of cholesterol and blood lipids  You should start having your  blood tested for lipids and cholesterol at 74 years of age, then have this test every 5 years.  You may need to have your cholesterol levels checked more often if:  Your lipid or cholesterol levels are high.  You are older than 74 years of age.  You are at high risk for heart disease.  CANCER SCREENING   Lung Cancer  Lung cancer screening is recommended for adults 26-9 years old who are at high risk for lung cancer because of a history of smoking.  A yearly low-dose CT scan of the lungs is recommended for people who:  Currently smoke.  Have quit within the past 15 years.  Have at least a 30-pack-year history of smoking. A pack year is smoking an average of one pack of cigarettes a day for 1 year.  Yearly screening should continue until it has been 15 years since you quit.  Yearly screening should stop if you develop a health problem that would prevent you from having lung cancer treatment.  Breast Cancer  Practice breast self-awareness. This means understanding how your breasts normally appear and feel.  It also means doing regular breast self-exams. Let your health care provider know about any changes, no matter how small.  If you are in your 20s or 30s, you should have a clinical breast exam (CBE) by a health care provider every 1-3 years as part of a regular health exam.  If you are 41 or older, have a CBE every year. Also consider having a breast X-ray (mammogram) every year.  If you have a family history of breast cancer, talk to your health care provider about genetic screening.  If you are at high risk for breast cancer, talk  to your health care provider about having an MRI and a mammogram every year.  Breast cancer gene (BRCA) assessment is recommended for women who have family members with BRCA-related cancers. BRCA-related cancers include:  Breast.  Ovarian.  Tubal.  Peritoneal cancers.  Results of the assessment will determine the need for genetic  counseling and BRCA1 and BRCA2 testing. Cervical Cancer Your health care provider may recommend that you be screened regularly for cancer of the pelvic organs (ovaries, uterus, and vagina). This screening involves a pelvic examination, including checking for microscopic changes to the surface of your cervix (Pap test). You may be encouraged to have this screening done every 3 years, beginning at age 100.  For women ages 52-65, health care providers may recommend pelvic exams and Pap testing every 3 years, or they may recommend the Pap and pelvic exam, combined with testing for human papilloma virus (HPV), every 5 years. Some types of HPV increase your risk of cervical cancer. Testing for HPV may also be done on women of any age with unclear Pap test results.  Other health care providers may not recommend any screening for nonpregnant women who are considered low risk for pelvic cancer and who do not have symptoms. Ask your health care provider if a screening pelvic exam is right for you.  If you have had past treatment for cervical cancer or a condition that could lead to cancer, you need Pap tests and screening for cancer for at least 20 years after your treatment. If Pap tests have been discontinued, your risk factors (such as having a new sexual partner) need to be reassessed to determine if screening should resume. Some women have medical problems that increase the chance of getting cervical cancer. In these cases, your health care provider may recommend more frequent screening and Pap tests. Colorectal Cancer  This type of cancer can be detected and often prevented.  Routine colorectal cancer screening usually begins at 74 years of age and continues through 74 years of age.  Your health care provider may recommend screening at an earlier age if you have risk factors for colon cancer.  Your health care provider may also recommend using home test kits to check for hidden blood in the stool.  A  small camera at the end of a tube can be used to examine your colon directly (sigmoidoscopy or colonoscopy). This is done to check for the earliest forms of colorectal cancer.  Routine screening usually begins at age 54.  Direct examination of the colon should be repeated every 5-10 years through 74 years of age. However, you may need to be screened more often if early forms of precancerous polyps or small growths are found. Skin Cancer  Check your skin from head to toe regularly.  Tell your health care provider about any new moles or changes in moles, especially if there is a change in a mole's shape or color.  Also tell your health care provider if you have a mole that is larger than the size of a pencil eraser.  Always use sunscreen. Apply sunscreen liberally and repeatedly throughout the day.  Protect yourself by wearing long sleeves, pants, a wide-brimmed hat, and sunglasses whenever you are outside. HEART DISEASE, DIABETES, AND HIGH BLOOD PRESSURE   High blood pressure causes heart disease and increases the risk of stroke. High blood pressure is more likely to develop in:  People who have blood pressure in the high end of the normal range (130-139/85-89 mm Hg).  People who are overweight or obese.  People who are African American.  If you are 26-20 years of age, have your blood pressure checked every 3-5 years. If you are 81 years of age or older, have your blood pressure checked every year. You should have your blood pressure measured twice--once when you are at a hospital or clinic, and once when you are not at a hospital or clinic. Record the average of the two measurements. To check your blood pressure when you are not at a hospital or clinic, you can use:  An automated blood pressure machine at a pharmacy.  A home blood pressure monitor.  If you are between 46 years and 106 years old, ask your health care provider if you should take aspirin to prevent strokes.  Have  regular diabetes screenings. This involves taking a blood sample to check your fasting blood sugar level.  If you are at a normal weight and have a low risk for diabetes, have this test once every three years after 74 years of age.  If you are overweight and have a high risk for diabetes, consider being tested at a younger age or more often. PREVENTING INFECTION  Hepatitis B  If you have a higher risk for hepatitis B, you should be screened for this virus. You are considered at high risk for hepatitis B if:  You were born in a country where hepatitis B is common. Ask your health care provider which countries are considered high risk.  Your parents were born in a high-risk country, and you have not been immunized against hepatitis B (hepatitis B vaccine).  You have HIV or AIDS.  You use needles to inject street drugs.  You live with someone who has hepatitis B.  You have had sex with someone who has hepatitis B.  You get hemodialysis treatment.  You take certain medicines for conditions, including cancer, organ transplantation, and autoimmune conditions. Hepatitis C  Blood testing is recommended for:  Everyone born from [birth year] through 1964.  Anyone with known risk factors for hepatitis C. Sexually transmitted infections (STIs)  You should be screened for sexually transmitted infections (STIs) including gonorrhea and chlamydia if:  You are sexually active and are younger than 74 years of age.  You are older than 74 years of age and your health care provider tells you that you are at risk for this type of infection.  Your sexual activity has changed since you were last screened and you are at an increased risk for chlamydia or gonorrhea. Ask your health care provider if you are at risk.  If you do not have HIV, but are at risk, it may be recommended that you take a prescription medicine daily to prevent HIV infection. This is called pre-exposure prophylaxis (PrEP). You are  considered at risk if:  You are sexually active and do not regularly use condoms or know the HIV status of your partner(s).  You take drugs by injection.  You are sexually active with a partner who has HIV. Talk with your health care provider about whether you are at high risk of being infected with HIV. If you choose to begin PrEP, you should first be tested for HIV. You should then be tested every 3 months for as long as you are taking PrEP.  PREGNANCY   If you are premenopausal and you may become pregnant, ask your health care provider about preconception counseling.  If you may become pregnant, take 400 to 800 micrograms (mcg)  of folic acid every day.  If you want to prevent pregnancy, talk to your health care provider about birth control (contraception). OSTEOPOROSIS AND MENOPAUSE   Osteoporosis is a disease in which the bones lose minerals and strength with aging. This can result in serious bone fractures. Your risk for osteoporosis can be identified using a bone density scan.  If you are 14 years of age or older, or if you are at risk for osteoporosis and fractures, ask your health care provider if you should be screened.  Ask your health care provider whether you should take a calcium or vitamin D supplement to lower your risk for osteoporosis.  Menopause may have certain physical symptoms and risks.  Hormone replacement therapy may reduce some of these symptoms and risks. Talk to your health care provider about whether hormone replacement therapy is right for you.  HOME CARE INSTRUCTIONS   Schedule regular health, dental, and eye exams.  Stay current with your immunizations.   Do not use any tobacco products including cigarettes, chewing tobacco, or electronic cigarettes.  If you are pregnant, do not drink alcohol.  If you are breastfeeding, limit how much and how often you drink alcohol.  Limit alcohol intake to no more than 1 drink per day for nonpregnant women. One  drink equals 12 ounces of beer, 5 ounces of wine, or 1 ounces of hard liquor.  Do not use street drugs.  Do not share needles.  Ask your health care provider for help if you need support or information about quitting drugs.  Tell your health care provider if you often feel depressed.  Tell your health care provider if you have ever been abused or do not feel safe at home.   This information is not intended to replace advice given to you by your health care provider. Make sure you discuss any questions you have with your health care provider.   Document Released: 06/27/2011 Document Revised: 01/02/2015 Document Reviewed: 11/13/2013 Elsevier Interactive Patient Education [employer].

## 2016-04-28 NOTE — Discharge Instructions (Signed)
Edema °Edema is an abnormal buildup of fluids in your body tissues. Edema is somewhat dependent on gravity to pull the fluid to the lowest place in your body. That makes the condition more common in the legs and thighs (lower extremities). Painless swelling of the feet and ankles is common and becomes more likely as you get older. It is also common in looser tissues, like around your eyes.  °When the affected area is squeezed, the fluid may move out of that spot and leave a dent for a few moments. This dent is called pitting.  °CAUSES  °There are many possible causes of edema. Eating too much salt and being on your feet or sitting for a long time can cause edema in your legs and ankles. Hot weather may make edema worse. Common medical causes of edema include: °· Heart failure. °· Liver disease. °· Kidney disease. °· Weak blood vessels in your legs. °· Cancer. °· An injury. °· Pregnancy. °· Some medications. °· Obesity.  °SYMPTOMS  °Edema is usually painless. Your skin may look swollen or shiny.  °DIAGNOSIS  °Your health care provider may be able to diagnose edema by asking about your medical history and doing a physical exam. You may need to have tests such as X-rays, an electrocardiogram, or blood tests to check for medical conditions that may cause edema.  °TREATMENT  °Edema treatment depends on the cause. If you have heart, liver, or kidney disease, you need the treatment appropriate for these conditions. General treatment may include: °· Elevation of the affected body part above the level of your heart. °· Compression of the affected body part. Pressure from elastic bandages or support stockings squeezes the tissues and forces fluid back into the blood vessels. This keeps fluid from entering the tissues. °· Restriction of fluid and salt intake. °· Use of a water pill (diuretic). These medications are appropriate only for some types of edema. They pull fluid out of your body and make you urinate more often. This  gets rid of fluid and reduces swelling, but diuretics can have side effects. Only use diuretics as directed by your health care provider. °HOME CARE INSTRUCTIONS  °· Keep the affected body part above the level of your heart when you are lying down.   °· Do not sit still or stand for prolonged periods.   °· Do not put anything directly under your knees when lying down. °· Do not wear constricting clothing or garters on your upper legs.   °· Exercise your legs to work the fluid back into your blood vessels. This may help the swelling go down.   °· Wear elastic bandages or support stockings to reduce ankle swelling as directed by your health care provider.   °· Eat a low-salt diet to reduce fluid if your health care provider recommends it.   °· Only take medicines as directed by your health care provider.  °SEEK MEDICAL CARE IF:  °· Your edema is not responding to treatment. °· You have heart, liver, or kidney disease and notice symptoms of edema. °· You have edema in your legs that does not improve after elevating them.   °· You have sudden and unexplained weight gain. °SEEK IMMEDIATE MEDICAL CARE IF:  °· You develop shortness of breath or chest pain.   °· You cannot breathe when you lie down. °· You develop pain, redness, or warmth in the swollen areas.   °· You have heart, liver, or kidney disease and suddenly get edema. °· You have a fever and your symptoms suddenly get worse. °MAKE SURE YOU:  °·   Understand these instructions. °· Will watch your condition. °· Will get help right away if you are not doing well or get worse. °  °This information is not intended to replace advice given to you by your health care provider. Make sure you discuss any questions you have with your health care provider. °  °Document Released: 12/12/2005 Document Revised: 01/02/2015 Document Reviewed: 10/04/2013 °Elsevier Interactive Patient Education ©2016 [employer] ° °

## 2016-04-28 NOTE — Progress Notes (Addendum)
Subjective:   [name] is a 75 y.o. female who presents for Medicare Annual (Subsequent) preventive examination.  Review of Systems:   HRA assessment completed during visit; [name], [name]  The Patient was informed that this wellness visit is to identify risk and educate on how to reduce risk for increase disease through lifestyle changes.   ROS deferred to CPE exam with physician  Family and medical hx given below; Still having pain with back and hip on right To see Dr. Louanne Skye; [facility] orthopedics; 5/31/  S/p fall and hip surgery  Family  Dtr stays nearby; most of family in [place] Her sisters checks on her; 2 younger sisters in 14's.   Medical C/o:  Today; states her right leg swollen; noted to have generalized swelling to the right leg; states leg was much bigger last week; reduced 50% now; States she has hx of gout; did not go to the doctor last week but was seen x 3 weeks ago; C/o of r knee pain; leg is darkened; no open areas of drainage.  Offered apt with Alejandra Paris, NP but can't come back this pm. Transportation is an issue;  States sister can't bring her back Discussed making apt with doctor when she leaves; Declines ER Pulse irreg/ irreg but no c/o of dizziness or chest pain; sob May need [facility] referral;   Asked why she was not seen last week and states she did not want to go to the hospital;  Finally agreed that leg is hurting; can't rule out DVT unless she is seen; also has irregular rate; asymptomatic; has not taken any meds this am; BP is elevated;  Agreed to non emergent transportation to [place] review:  Atrial Fib- (Stroke) walking with cane; since hip fx in 89 Stroke 89; MI 90's  CAD/ HTN Lipids 01/2015 cho 151; Trig 100; HDL 54; LDL 77  Asthma DM2: A1c 12/2015 6.4 Obesity;   Tobacco; former; quit 6/20015; (75 yo) (7.5 pack years)   ETOH: occasional beer  Medication review/ Adherent; states she is careful  about what she is taking but does not know what some of her medicine is for; States she has not taken any meds this am; Was in a hurry this am;  Not sure if she is confused regarding daily meds; she prepares her own meds   BMI: 27.2  Diet;  Goes to grocery store via sister Doesn't eat much for breakfast; milk; chocolate; bowl of cereal Sandwich Supper; chicken and potatoes; chicken wings;   Exercise;  Up and walking around home; pushing a broom; cleaning  Laundry; does her own  [place]; lives in apt; has good neighbors [employer] transportation; [employer]   Does ADLs in am and has one person comes in and helps with house work and cooking Has not been able to come lately; "Been down in back and hip"  No stairs Bathroom safety;  Hx of falling in tub; now Has a chair in tub;  Has a life line; forgot to take medicine this am  Smoke detectors yes Firearms safety reviewed and will keep in a safe place if these exist. Does not have firearms in home Driving accidents; no; Wears a seatbelt Does not go in the sun  Depression: Denies feeling depressed or hopeless; voices pleasure in daily life Mood stable; no   Cognitive; Presents with no issues;  Engaged in assessment Manages checkbook, medications; no failures of task Ad8 score reviewed for issues;  states she is good; pays her bills; Was inpatient in snf x 5 years and likes her independence and her apt. Manages her own checking account Ad8 score 0 but not confirmed   Fall assessment; no  Gait assessment appears normal; walks with cane; c/o of right hip pain and to see ortho at the end of the month  Mobilization and Functional losses from last year to this year? About the same Stayed in SNF 5.5 years; 05; Got her apt Jan 7th 2011;   Sleep pattern changes; sleeping good; been awake since 2am this  Hours of sleep on average? May take a nap during the day Finds things to do at home to keep her occupied    Lifeline:  http://www.lifelinesys.com/content/home; 947-533-6856 x2102 / has a lifeline  Counseling Health Maintenance Colonoscopy; Dr. Paulita [name]; at [place]; [id number] thinks he did not last year; Dr. Suzette [name] is GYN: [facility]  EKG: 10/2014  Mammogram 02/2015/ Generally has annually;  Dexa 03/2014 PAP- Dr. [name]; fup on mammograms   Hearing: wears hearing aid; went to [facility] on [address];  Hearing aid for right ear; left ear can't hear and hearing aid will not help   Ophthalmology exam; had this done in August about one year ago Goes to [employer]   Immunizations Due; declines all vaccines  Advanced Directive; Would prefer her younger sisters; will take copy of information  Health Recommendations and Referrals  Declined apt today with Nurse Practitioner due to transportation but understands risk for clot and will call 911 if you are concerned; Otherwise; states this is gout and seen Dr. Sharlet [name] last week and leg was swollen at that time. States she always has swelling in her right leg and this was confirmed by CNA who seen her the last time.   Current Care Team reviewed and updated Cardiac Risk Factors include: advanced age (>33men, >67 women);diabetes mellitus;dyslipidemia;family history of premature cardiovascular disease;obesity (BMI >30kg/m2);sedentary lifestyle     Objective:     Vitals: BP 150/86 mmHg  Pulse 90  Ht 5\' 4"  (1.626 m)  Wt 217 lb (98.431 kg)  BMI 37.23 kg/m2  SpO2 97%  Body mass index is 37.23 kg/(m^2).   Tobacco History  Smoking status  . Former Smoker -- 0.25 packs/day for 30 years  . Types: Cigarettes  Smokeless tobacco  . Former Systems developer  . Quit date: 06/13/2004    Comment: Quit when she went to nursing home/ states she light smoker; Only smoked periodically; Pack years undetermined     Counseling given: Not Answered   Past Medical History  Diagnosis Date  . Stroke ([name])   . Acute MI ([name])   . Asthma   . Diabetes  mellitus   . Arthritis   . Gout    Past Surgical History  Procedure Laterality Date  . Joint replacement    . Back surgery Left 2003    left knee replacement and left shoulder   Family History  Problem Relation Age of Onset  . Cancer Brother   . Cancer Mother    History  Sexual Activity  . Sexual Activity: Not Currently    Outpatient Encounter Prescriptions as of 04/28/2016  Medication Sig  . albuterol (VENTOLIN HFA) 108 (90 BASE) MCG/ACT inhaler Inhale 2 puffs into the lungs every 6 (six) hours as needed for wheezing or shortness of breath (SOB).  Marland Kitchen allopurinol (ZYLOPRIM) 100 MG tablet Take 1 tablet (100 mg total) by mouth daily.  Marland Kitchen apixaban (ELIQUIS) 5 MG TABS  tablet TAKE 1 TABLET (5 MG TOTAL) BY MOUTH TWO   (TWO) TIMES DAILY.  Marland Kitchen atenolol (TENORMIN) 50 MG tablet Take 1 tablet (50 mg total) by mouth daily.  . DULoxetine (CYMBALTA) 60 MG capsule Take 1 capsule (60 mg total) by mouth daily.  . Fluticasone-Salmeterol (ADVAIR) 100-50 MCG/DOSE AEPB Inhale 1 puff into the lungs 2 (two) times daily.  . furosemide (LASIX) 40 MG tablet Take 1 tablet (40 mg total) by mouth daily as needed (leg swelling).  Marland Kitchen HYDROcodone-acetaminophen (NORCO/VICODIN) 5-325 MG tablet Take 1-2 tablets by mouth daily as needed for moderate pain.  Marland Kitchen lisinopril (PRINIVIL,ZESTRIL) 10 MG tablet Take 1 tablet (10 mg total) by mouth daily.  Marland Kitchen loratadine (CLARITIN) 10 MG tablet Take 1 tablet (10 mg total) by mouth daily as needed for allergies.  . metFORMIN (GLUCOPHAGE) 500 MG tablet Take 1 tablet (500 mg total) by mouth daily with breakfast.  . omeprazole (PRILOSEC) 20 MG capsule Take 1 capsule (20 mg total) by mouth daily.  . simvastatin (ZOCOR) 20 MG tablet Take 1 tablet (20 mg total) by mouth daily.  . [DISCONTINUED] HYDROcodone-acetaminophen (NORCO/VICODIN) 5-325 MG tablet Take 1-2 tablets by mouth daily as needed for moderate pain.   No facility-administered encounter medications on file as of 04/28/2016.     Activities of Daily Living In your present state of health, do you have any difficulty performing the following activities: 04/28/2016  Hearing? N  Vision? N  Difficulty concentrating or making decisions? N  Walking or climbing stairs? Y  Dressing or bathing? N  Doing errands, shopping? (No Data)  Preparing Food and eating ? N  Using the Toilet? N  Managing your Medications? N  Managing your Finances? N  Housekeeping or managing your Housekeeping? N    Patient Care Team: Hoyt Koch, MD as PCP - General (Internal Medicine) Shelly Bombard, MD as Consulting Physician (Obstetrics and Gynecology)    Assessment:     Presents with generalized swelling to right leg; started x 1 weeks ago, has not been to doctor. BP elevated but did not take medicine this am Denies dizziness, sob, chest pain or other States leg still "hurts' and really hurt this am  Rate noted to be in ireg/ireg; apical rate 70; rate 48 and 90; pulse ox stable Is asymptomatic of rate;  The patient did not want to go to ER; declined to come back and see a doctor today. Discussed at length that she would have to call 911 and that she needs to be seen when her leg swells; Agrees to go via non-emergent transport to [place] long ER for [facility] long ER (triage) notified   Exercise Activities and Dietary recommendations Current Exercise Habits: Home exercise routine, Type of exercise: walking, Time (Minutes): 10, Frequency (Times/Week): 5, Weekly Exercise (Minutes/Week): 50, Intensity: Mild  Goals    . Exercise 3x per week (30 min per time)     Wants to remain independent; Cleans house to stay mobile; likes to cook;     . patient      Will learn her medicines and go to the doctor when you have "red flags" Dizziness, sob, falls, swollen leg; other issues that come up suddenly;       Fall Risk Fall Risk  04/28/2016 04/13/2016 04/07/2015  Falls in the past year? No No No  Risk for fall due to : -  - Impaired mobility  Risk for fall due to (comments): - - uses cane on right/hurts on  right   Depression Screen PHQ 2/9 Scores 04/28/2016 04/13/2016 04/07/2015  PHQ - 2 Score 0 0 0     Cognitive Testing No flowsheet data found.  There is no immunization history for the selected administration types on file for this patient. Screening Tests Health Maintenance  Topic Date Due  . OPHTHALMOLOGY EXAM  05/11/1951  . COLONOSCOPY  05/11/1992  . ZOSTAVAX  04/13/2017 (Originally 05/11/2002)  . TETANUS/TDAP  04/13/2017 (Originally 05/11/1961)  . PNA vac Low Risk Adult (1 of 2 - PCV13) 04/13/2017 (Originally 05/12/2007)  . HEMOGLOBIN A1C  07/06/2016  . INFLUENZA VACCINE  07/26/2016  . FOOT EXAM  01/07/2017  . MAMMOGRAM  03/16/2018  . DEXA SCAN  Completed      Plan:   Dr [name] ok inc'd rx for pain and Dr. Ronnald Ramp signed off on rx today  The patient agreed to go to the ER to be evaluated for DVT or arrhythmia;   Will fup on colonoscopy and eye exam   During the course of the visit the patient was educated and counseled about the following appropriate screening and preventive services:   Vaccines to include Pneumoccal, Influenza, Hepatitis B, Td, Zostavax, HCV  Electrocardiogram  Cardiovascular Disease  Colorectal cancer screening  Bone density screening  Diabetes screening  Glaucoma screening  Mammography/PAP  Nutrition counseling   Patient Instructions (the written plan) was given to the patient.   [id number], RN  04/28/2016     Medical screening examination/treatment/procedure(s) were performed by non-physician practitioner and as supervising physician I was immediately available for consultation/collaboration. I agree with above. Scarlette Calico, MD

## 2016-04-28 NOTE — Progress Notes (Signed)
VASCULAR LAB PRELIMINARY  PRELIMINARY  PRELIMINARY  PRELIMINARY  Right lower extremity venous duplex completed.     Right:  No evidence of DVT, superficial thrombosis, or Baker's cyst.  Gave results to patient's nurse.  Deborrah Mabin, RVT, RDMS 04/28/2016, 1:23 PM

## 2016-04-28 NOTE — ED Provider Notes (Addendum)
CSN: [id number]     Arrival date & time 04/28/16  1141 History   First MD Initiated Contact with Patient 04/28/16 1147     Chief Complaint  Patient presents with  . Leg Swelling     (Consider location/radiation/quality/duration/timing/severity/associated sxs/prior Treatment) HPI Comments: Patient is a 75 year old female with a history of acute MI, asthma, diabetes, A. fib, DVT, stroke who is currently on Eliquis is presenting from her doctor's office to have evaluation of swelling in her right lower extremity. Patient states that she went to her doctor to get a checkup today because of the swelling and pain in her leg but the doctor was not there so they sent her here for evaluation. Patient denies any new shortness of breath or chest pain. She does have irregular heart rate which she has had for some time but denies any change. She states the swelling and pain in her leg has been intermittent since being diagnosed with a DVT in November. His doses of her medication except for this morning when she was recently forgot to take them before going to the doctor. She denies any infectious symptoms such as fever, cough, diarrhea or vomiting.  The history is provided by the patient.    Past Medical History  Diagnosis Date  . Stroke ([place])   . Acute MI ([place])   . Asthma   . Diabetes mellitus   . Arthritis   . Gout   . Atrial fibrillation ([place])   . DVT (deep venous thrombosis) ([place])   . Gout    Past Surgical History  Procedure Laterality Date  . Joint replacement    . Back surgery Left 2003    left knee replacement and left shoulder  . Shoulder surgery    . Foot surgery     Family History  Problem Relation Age of Onset  . Cancer Brother   . Cancer Mother    Social History  Substance Use Topics  . Smoking status: Former Smoker -- 0.25 packs/day for 30 years    Types: Cigarettes  . Smokeless tobacco: Former Systems developer    Quit date: 06/13/2004     Comment: Quit when she went to nursing home/  states she light smoker; Only smoked periodically; Pack years undetermined  . Alcohol Use: 0.6 oz/week    1 Standard drinks or equivalent per week     Comment: Occasinaly beer with dinner   OB History    No data available     Review of Systems  All other systems reviewed and are negative.     Allergies  Penicillins  Home Medications   Prior to Admission medications   Medication Sig Start Date End Date Taking? Authorizing Provider  albuterol (VENTOLIN HFA) 108 (90 BASE) MCG/ACT inhaler Inhale 2 puffs into the lungs every 6 (six) hours as needed for wheezing or shortness of breath (SOB).   Yes Historical Provider, MD  allopurinol (ZYLOPRIM) 100 MG tablet Take 1 tablet (100 mg total) by mouth daily. 01/08/16  Yes Hoyt Koch, MD  apixaban (ELIQUIS) 5 MG TABS tablet TAKE 1 TABLET (5 MG TOTAL) BY MOUTH TWO   (TWO) TIMES DAILY. Patient taking differently: Take 5 mg by mouth 2 (two) times daily. TAKE 1 TABLET (5 MG TOTAL) BY MOUTH TWO   (TWO) TIMES DAILY. 01/07/16  Yes Hoyt Koch, MD  atenolol (TENORMIN) 50 MG tablet Take 1 tablet (50 mg total) by mouth daily. 01/07/16  Yes Hoyt Koch, MD  DULoxetine (CYMBALTA) 60  MG capsule Take 1 capsule (60 mg total) by mouth daily. 01/07/16  Yes Hoyt Koch, MD  Fluticasone-Salmeterol (ADVAIR) 100-50 MCG/DOSE AEPB Inhale 1 puff into the lungs 2 (two) times daily.   Yes Historical Provider, MD  furosemide (LASIX) 40 MG tablet Take 1 tablet (40 mg total) by mouth daily as needed (leg swelling). 01/07/16  Yes Hoyt Koch, MD  HYDROcodone-acetaminophen (NORCO/VICODIN) 5-325 MG tablet Take 1-2 tablets by mouth daily as needed for moderate pain. 04/28/16  Yes Janith Lima, MD  lisinopril (PRINIVIL,ZESTRIL) 10 MG tablet Take 1 tablet (10 mg total) by mouth daily. 01/07/16  Yes Hoyt Koch, MD  loratadine (CLARITIN) 10 MG tablet Take 1 tablet (10 mg total) by mouth daily as needed for allergies. 01/07/16  Yes  Hoyt Koch, MD  metFORMIN (GLUCOPHAGE) 500 MG tablet Take 1 tablet (500 mg total) by mouth daily with breakfast. 01/07/16  Yes Hoyt Koch, MD  omeprazole (PRILOSEC) 20 MG capsule Take 1 capsule (20 mg total) by mouth daily. 01/07/16  Yes Hoyt Koch, MD  simvastatin (ZOCOR) 20 MG tablet Take 1 tablet (20 mg total) by mouth daily. 01/07/16  Yes Hoyt Koch, MD   BP 162/94 mmHg  Pulse 104  Temp(Src) 98.8 F (37.1 C) (Oral)  Resp 18  Ht 5\' 4"  (1.626 m)  Wt 262 lb (118.842 kg)  BMI 44.95 kg/m2  SpO2 100% Physical Exam  Constitutional: She is oriented to person, place, and time. She appears well-developed and well-nourished. No distress.  HENT:  Head: Normocephalic and atraumatic.  Mouth/Throat: Oropharynx is clear and moist.  Eyes: Conjunctivae and EOM are normal. Pupils are equal, round, and reactive to light.  Neck: Normal range of motion. Neck supple.  Cardiovascular: Normal rate and intact distal pulses.  An irregularly irregular rhythm present.  No murmur heard. Pulmonary/Chest: Effort normal and breath sounds normal. No respiratory distress. She has no wheezes. She has no rales.  Abdominal: Soft.  Musculoskeletal: Normal range of motion. She exhibits tenderness. She exhibits no edema.       Legs: Neurological: She is alert and oriented to person, place, and time.  Skin: Skin is warm and dry. No rash noted. No erythema.  Psychiatric: She has a normal mood and affect. Her behavior is normal.  Nursing note and vitals reviewed.   ED Course  Procedures (including critical care time) Labs Review Labs Reviewed - No data to display  Imaging Review No results found. I have personally reviewed and evaluated these images and lab results as part of my medical decision-making.   EKG Interpretation None      MDM   Final diagnoses:  Swelling of right lower extremity    Patient presents today after being sent from her primary care office to  evaluate for leg edema. Patient states she has a history of DVT and since that time she's had intermittent pain and swelling in her leg. She still takes her medications daily as prescribed but has not taken any this morning. She denies any new chest pain or shortness of breath. She has not had any recent medication changes. She denies any trauma.   DVT u/s wnl no dvt.  No signs of significant fluid overload at this time. Low suspicion for CHF.  No signs of cellulitis.  Most likely pain and swelling from post DVT syndrome. Will d/c pt home and have her f/u with PCP.  Blanchie Dessert, MD 04/28/16 1349  Blanchie Dessert, MD 04/28/16 1351

## 2016-04-28 NOTE — ED Notes (Signed)
Ted Hose applied.

## 2016-04-28 NOTE — ED Notes (Signed)
Pt being sent by ambulance from [facility] primary care for right leg edema, irregular heartrate, hx of DVT, gout, atrial fibrillation. No providers available to see her at clinic.

## 2016-05-03 DIAGNOSIS — M25551 Pain in right hip: Secondary | ICD-10-CM | POA: Diagnosis not present

## 2016-05-16 DIAGNOSIS — M25551 Pain in right hip: Secondary | ICD-10-CM | POA: Diagnosis not present

## 2016-05-25 DIAGNOSIS — M47812 Spondylosis without myelopathy or radiculopathy, cervical region: Secondary | ICD-10-CM | POA: Diagnosis not present

## 2016-05-25 DIAGNOSIS — M4316 Spondylolisthesis, lumbar region: Secondary | ICD-10-CM | POA: Diagnosis not present

## 2016-05-25 DIAGNOSIS — M4806 Spinal stenosis, lumbar region: Secondary | ICD-10-CM | POA: Diagnosis not present

## 2016-05-25 DIAGNOSIS — M4317 Spondylolisthesis, lumbosacral region: Secondary | ICD-10-CM | POA: Diagnosis not present

## 2016-05-26 DIAGNOSIS — M539 Dorsopathy, unspecified: Secondary | ICD-10-CM | POA: Diagnosis not present

## 2016-05-26 DIAGNOSIS — M159 Polyosteoarthritis, unspecified: Secondary | ICD-10-CM | POA: Diagnosis not present

## 2016-05-26 DIAGNOSIS — R32 Unspecified urinary incontinence: Secondary | ICD-10-CM | POA: Diagnosis not present

## 2016-05-26 DIAGNOSIS — I213 ST elevation (STEMI) myocardial infarction of unspecified site: Secondary | ICD-10-CM | POA: Diagnosis not present

## 2016-06-02 ENCOUNTER — Telehealth: Payer: Self-pay | Admitting: *Deleted

## 2016-06-02 DIAGNOSIS — R102 Pelvic and perineal pain: Secondary | ICD-10-CM

## 2016-06-02 MED ORDER — HYDROCODONE-ACETAMINOPHEN 5-325 MG PO TABS: 1.0000 | ORAL_TABLET | Freq: Every day | ORAL | Status: DC | PRN

## 2016-06-02 NOTE — Telephone Encounter (Signed)
Left msg on triage requesting refill on her Hydrocodone.../lmb 

## 2016-06-02 NOTE — Telephone Encounter (Signed)
Printed and signed.  

## 2016-06-03 NOTE — Telephone Encounter (Signed)
Notified pt rx ready for pick-up.../lmb 

## 2016-06-07 ENCOUNTER — Other Ambulatory Visit: Payer: Self-pay | Admitting: Internal Medicine

## 2016-06-13 ENCOUNTER — Ambulatory Visit: Payer: Medicare Other | Admitting: Obstetrics

## 2016-06-30 ENCOUNTER — Telehealth: Payer: Self-pay | Admitting: *Deleted

## 2016-06-30 DIAGNOSIS — R102 Pelvic and perineal pain: Secondary | ICD-10-CM

## 2016-06-30 MED ORDER — HYDROCODONE-ACETAMINOPHEN 5-325 MG PO TABS: 1.0000 | ORAL_TABLET | Freq: Every day | ORAL | Status: DC | PRN

## 2016-06-30 NOTE — Telephone Encounter (Signed)
Keep getting fast busy signal x's 2 calls Place rx up front for pick-up...[name]

## 2016-06-30 NOTE — Telephone Encounter (Signed)
Left msg on triage requesting refill on her pain med hydrocodone...[name]

## 2016-06-30 NOTE — Telephone Encounter (Signed)
Printed and signed.  

## 2016-07-25 ENCOUNTER — Telehealth: Payer: Self-pay

## 2016-07-25 DIAGNOSIS — R32 Unspecified urinary incontinence: Secondary | ICD-10-CM | POA: Diagnosis not present

## 2016-07-26 ENCOUNTER — Ambulatory Visit (INDEPENDENT_AMBULATORY_CARE_PROVIDER_SITE_OTHER): Payer: Medicare Other | Admitting: Obstetrics

## 2016-07-26 ENCOUNTER — Encounter: Payer: Self-pay | Admitting: Obstetrics

## 2016-07-26 VITALS — BP 138/83 | HR 68 | Temp 97.8°F | Resp 17 | Wt 210.6 lb

## 2016-07-26 DIAGNOSIS — Z124 Encounter for screening for malignant neoplasm of cervix: Secondary | ICD-10-CM | POA: Diagnosis not present

## 2016-07-26 DIAGNOSIS — Z01419 Encounter for gynecological examination (general) (routine) without abnormal findings: Secondary | ICD-10-CM | POA: Diagnosis not present

## 2016-07-26 DIAGNOSIS — Z780 Asymptomatic menopausal state: Secondary | ICD-10-CM | POA: Diagnosis not present

## 2016-07-26 DIAGNOSIS — N949 Unspecified condition associated with female genital organs and menstrual cycle: Secondary | ICD-10-CM | POA: Diagnosis not present

## 2016-07-26 NOTE — Progress Notes (Signed)
Patient ID: [name], female   DOB: Aug 14, 1941, 75 y.o.   MRN: [id number]   Subjective:        [name] is a 75 y.o. female here for a routine exam.  Current complaints: None.    Personal health questionnaire:  Is patient Ashkenazi Jewish, have a family history of breast and/or ovarian cancer: no Is there a family history of uterine cancer diagnosed at age < 49, gastrointestinal cancer, urinary tract cancer, family member who is a Field seismologist syndrome-associated carrier: no Is the patient overweight and hypertensive, family history of diabetes, personal history of gestational diabetes, preeclampsia or PCOS: no Is patient over 65, have PCOS,  family history of premature CHD under age 39, diabetes, smoke, have hypertension or peripheral artery disease:  no At any time, has a partner hit, kicked or otherwise hurt or frightened you?: no Over the past 2 weeks, have you felt down, depressed or hopeless?: no Over the past 2 weeks, have you felt little interest or pleasure in doing things?:no   Gynecologic History No LMP recorded. Patient is postmenopausal. Contraception: post menopausal status Last Pap: 2015. Results were: normal Last mammogram: 2017. Results were: normal  Obstetric History OB History  No data available    Past Medical History:  Diagnosis Date  . Acute MI ([place])   . Arthritis   . Asthma   . Atrial fibrillation ([place])   . Diabetes mellitus   . DVT (deep venous thrombosis) ([place])   . Gout   . Gout   . Stroke [facility])     Past Surgical History:  Procedure Laterality Date  . BACK SURGERY Left 2003   left knee replacement and left shoulder  . FOOT SURGERY    . JOINT REPLACEMENT    . SHOULDER SURGERY       Current Outpatient Prescriptions:  .  albuterol (VENTOLIN HFA) 108 (90 BASE) MCG/ACT inhaler, Inhale 2 puffs into the lungs every 6 (six) hours as needed for wheezing or shortness of breath (SOB)., Disp: , Rfl:  .  allopurinol (ZYLOPRIM) 100 MG  tablet, TAKE 1 TABLET (100 MG TOTAL) BY MOUTH DAILY., Disp: 90 tablet, Rfl: 3 .  apixaban (ELIQUIS) 5 MG TABS tablet, TAKE 1 TABLET (5 MG TOTAL) BY MOUTH TWO   (TWO) TIMES DAILY. (Patient taking differently: Take 5 mg by mouth 2 (two) times daily. TAKE 1 TABLET (5 MG TOTAL) BY MOUTH TWO   (TWO) TIMES DAILY.), Disp: 180 tablet, Rfl: 3 .  atenolol (TENORMIN) 50 MG tablet, Take 1 tablet (50 mg total) by mouth daily., Disp: 90 tablet, Rfl: 3 .  DULoxetine (CYMBALTA) 60 MG capsule, Take 1 capsule (60 mg total) by mouth daily., Disp: 90 capsule, Rfl: 3 .  Fluticasone-Salmeterol (ADVAIR) 100-50 MCG/DOSE AEPB, Inhale 1 puff into the lungs 2 (two) times daily., Disp: , Rfl:  .  furosemide (LASIX) 40 MG tablet, Take 1 tablet (40 mg total) by mouth daily as needed (leg swelling)., Disp: 90 tablet, Rfl: 3 .  HYDROcodone-acetaminophen (NORCO/VICODIN) 5-325 MG tablet, Take 1-2 tablets by mouth daily as needed for moderate pain., Disp: 45 tablet, Rfl: 0 .  lisinopril (PRINIVIL,ZESTRIL) 10 MG tablet, Take 1 tablet (10 mg total) by mouth daily., Disp: 90 tablet, Rfl: 3 .  loratadine (CLARITIN) 10 MG tablet, Take 1 tablet (10 mg total) by mouth daily as needed for allergies., Disp: 90 tablet, Rfl: 3 .  metFORMIN (GLUCOPHAGE) 500 MG tablet, Take 1 tablet (500 mg total) by mouth  daily with breakfast., Disp: 90 tablet, Rfl: 3 .  omeprazole (PRILOSEC) 20 MG capsule, Take 1 capsule (20 mg total) by mouth daily., Disp: 90 capsule, Rfl: 3 .  simvastatin (ZOCOR) 20 MG tablet, Take 1 tablet (20 mg total) by mouth daily., Disp: 90 tablet, Rfl: 3 Allergies  Allergen Reactions  . Penicillins Other (See Comments)    Has patient had a PCN reaction causing immediate rash, facial/tongue/throat swelling, SOB or lightheadedness with hypotension: unknown Has patient had a PCN reaction causing severe rash involving mucus membranes or skin necrosis:unknown Has patient had a PCN reaction that required hospitalization unknown Has patient  had a PCN reaction occurring within the last 10 years: unknown If all of the above answers are "NO", then may proceed with Cephalosporin use.     Social History  Substance Use Topics  . Smoking status: Former Smoker    Packs/day: 0.25    Years: 30.00    Types: Cigarettes  . Smokeless tobacco: Former Systems developer    Quit date: 06/13/2004     Comment: Quit when she went to nursing home/ states she light smoker; Only smoked periodically; Pack years undetermined  . Alcohol use 0.6 oz/week    1 Standard drinks or equivalent per week     Comment: Occasinaly beer with dinner    Family History  Problem Relation Age of Onset  . Cancer Brother   . Cancer Mother       Review of Systems  Constitutional: negative for fatigue and weight loss Respiratory: negative for cough and wheezing Cardiovascular: negative for chest pain, fatigue and palpitations Gastrointestinal: negative for abdominal pain and change in bowel habits Musculoskeletal:negative for myalgias Neurological: negative for gait problems and tremors Behavioral/Psych: negative for abusive relationship, depression Endocrine: negative for temperature intolerance   Genitourinary:negative for abnormal menstrual periods, genital lesions, hot flashes, sexual problems and vaginal discharge Integument/breast: negative for breast lump, breast tenderness, nipple discharge and skin lesion(s)    Objective:       BP 138/83   Pulse 68   Temp 97.8 F (36.6 C)   Resp 17   Wt 210 lb 9.6 oz (95.5 kg)   BMI 36.15 kg/m  General:   alert  Skin:   no rash or abnormalities  Lungs:   clear to auscultation bilaterally  Heart:   regular rate and rhythm, S1, S2 normal, no murmur, click, rub or gallop  Breasts:   normal without suspicious masses, skin or nipple changes or axillary nodes  Abdomen:  normal findings: no organomegaly, soft, non-tender and no hernia  Pelvis:  External genitalia: normal general appearance Urinary system: urethral meatus  normal and bladder without fullness, nontender Vaginal: normal without tenderness, induration or masses Cervix: normal appearance Adnexa: normal bimanual exam Uterus: anteverted and non-tender, normal size   Lab Review Urine pregnancy test Labs reviewed yes Radiologic studies reviewed yes    Assessment:    Healthy female exam.    Postmenopause.  Stable.  Obesity   Plan:     Weight management and maintenance program recommended  Education reviewed: calcium supplements, low fat, low cholesterol diet, self breast exams and weight bearing exercise. Follow up in: 2 years.   No orders of the defined types were placed in this encounter.  No orders of the defined types were placed in this encounter.

## 2016-07-26 NOTE — Addendum Note (Signed)
Addended by: Valli Glance F on: 07/26/2016 04:59 PM   Modules accepted: Orders

## 2016-07-26 NOTE — Addendum Note (Signed)
Addended by: Valli Glance F on: 07/26/2016 04:22 PM   Modules accepted: Orders

## 2016-07-27 LAB — PAP IG (IMAGE GUIDED): PAP SMEAR COMMENT: 0

## 2016-07-29 LAB — NUSWAB BV AND CANDIDA, NAA
CANDIDA ALBICANS, NAA: NEGATIVE
Candida glabrata, NAA: NEGATIVE

## 2016-08-01 NOTE — Telephone Encounter (Signed)
Pt. Made aware of appt.

## 2016-08-02 ENCOUNTER — Telehealth: Payer: Self-pay | Admitting: Emergency Medicine

## 2016-08-02 DIAGNOSIS — R102 Pelvic and perineal pain: Secondary | ICD-10-CM

## 2016-08-02 MED ORDER — HYDROCODONE-ACETAMINOPHEN 5-325 MG PO TABS: 1.0000 | ORAL_TABLET | Freq: Every day | ORAL | 0 refills | Status: DC | PRN

## 2016-08-02 NOTE — Telephone Encounter (Signed)
Pt called and needs a prescription refill for HYDROcodone-acetaminophen (NORCO/VICODIN) 5-325 MG tablet. Please follow up thanks.

## 2016-08-02 NOTE — Telephone Encounter (Signed)
Printed and signed.  

## 2016-08-03 ENCOUNTER — Telehealth: Payer: Self-pay | Admitting: *Deleted

## 2016-08-03 NOTE — Telephone Encounter (Signed)
I have been calling home number for 2 days to inform patient and it has been busy. I called mobile phone and did not get an answer, or a voice mail. The prescription is up front in the cabinet. I am unable to inform patient.

## 2016-08-03 NOTE — Telephone Encounter (Signed)
Left message with emergency contact to inform patient that her prescription is ready.

## 2016-08-03 NOTE — Telephone Encounter (Signed)
Rec'd call pt requesting refill on her Hydrocodone.../lmb 

## 2016-08-04 NOTE — Telephone Encounter (Signed)
NOTIFIED PT RX READY FOR PICK-UP...[name]

## 2016-08-04 NOTE — Telephone Encounter (Signed)
This was printed and signed on 08/02/16 so she can pickup from front.

## 2016-08-25 DIAGNOSIS — I10 Essential (primary) hypertension: Secondary | ICD-10-CM | POA: Diagnosis not present

## 2016-08-25 DIAGNOSIS — E119 Type 2 diabetes mellitus without complications: Secondary | ICD-10-CM | POA: Diagnosis not present

## 2016-08-25 DIAGNOSIS — I639 Cerebral infarction, unspecified: Secondary | ICD-10-CM | POA: Diagnosis not present

## 2016-08-25 DIAGNOSIS — I482 Chronic atrial fibrillation: Secondary | ICD-10-CM | POA: Diagnosis not present

## 2016-08-25 DIAGNOSIS — I2510 Atherosclerotic heart disease of native coronary artery without angina pectoris: Secondary | ICD-10-CM | POA: Diagnosis not present

## 2016-08-30 ENCOUNTER — Telehealth: Payer: Self-pay

## 2016-08-30 DIAGNOSIS — R102 Pelvic and perineal pain: Secondary | ICD-10-CM

## 2016-08-30 MED ORDER — HYDROCODONE-ACETAMINOPHEN 5-325 MG PO TABS: 1.0000 | ORAL_TABLET | Freq: Every day | ORAL | 0 refills | Status: DC | PRN

## 2016-08-30 NOTE — Telephone Encounter (Signed)
Left message asking patient to call back. I need to inform her that she has a prescription ready for pick up.

## 2016-08-30 NOTE — Telephone Encounter (Signed)
HYDROcodone-acetaminophen (NORCO/VICODIN) 5-325 MG tablet [  Patient is requesting a refill on this medication. Please advise her when its ready to pick up.

## 2016-08-30 NOTE — Telephone Encounter (Signed)
Has she been back to her orthopedic doctor since our last visit? If not then she needs to come in for visit as this is in her contract that she needs to be willing to try other treatments to improve her pain. Rx printed and signed, if no orthopedic visit needs visit for any more refills.

## 2016-09-02 ENCOUNTER — Other Ambulatory Visit: Payer: Self-pay | Admitting: Internal Medicine

## 2016-09-14 DIAGNOSIS — H353131 Nonexudative age-related macular degeneration, bilateral, early dry stage: Secondary | ICD-10-CM | POA: Diagnosis not present

## 2016-09-14 DIAGNOSIS — H04123 Dry eye syndrome of bilateral lacrimal glands: Secondary | ICD-10-CM | POA: Diagnosis not present

## 2016-09-14 DIAGNOSIS — H35033 Hypertensive retinopathy, bilateral: Secondary | ICD-10-CM | POA: Diagnosis not present

## 2016-09-14 DIAGNOSIS — H5212 Myopia, left eye: Secondary | ICD-10-CM | POA: Diagnosis not present

## 2016-09-14 DIAGNOSIS — H43811 Vitreous degeneration, right eye: Secondary | ICD-10-CM | POA: Diagnosis not present

## 2016-09-28 ENCOUNTER — Ambulatory Visit (INDEPENDENT_AMBULATORY_CARE_PROVIDER_SITE_OTHER): Payer: Medicare Other | Admitting: Specialist

## 2016-09-28 DIAGNOSIS — M1812 Unilateral primary osteoarthritis of first carpometacarpal joint, left hand: Secondary | ICD-10-CM

## 2016-09-28 DIAGNOSIS — R200 Anesthesia of skin: Secondary | ICD-10-CM

## 2016-09-28 DIAGNOSIS — M19042 Primary osteoarthritis, left hand: Secondary | ICD-10-CM | POA: Diagnosis not present

## 2016-09-28 DIAGNOSIS — M4317 Spondylolisthesis, lumbosacral region: Secondary | ICD-10-CM | POA: Diagnosis not present

## 2016-09-28 DIAGNOSIS — M47812 Spondylosis without myelopathy or radiculopathy, cervical region: Secondary | ICD-10-CM | POA: Diagnosis not present

## 2016-09-28 DIAGNOSIS — M4316 Spondylolisthesis, lumbar region: Secondary | ICD-10-CM | POA: Diagnosis not present

## 2016-09-30 ENCOUNTER — Telehealth: Payer: Self-pay | Admitting: Emergency Medicine

## 2016-09-30 NOTE — Telephone Encounter (Signed)
Called all pharmacies and no medications have been filled today by any pharmacies. Left message for patient to call me back.
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
## 2016-10-03 NOTE — Telephone Encounter (Signed)
Tried to reach patient at number provided. No answer. Left message.

## 2016-10-03 NOTE — Telephone Encounter (Signed)
Pt called back in and asked if she was going to get her pain medication?  She would like for you to call her back when you get a chance.
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
## 2016-10-04 NOTE — Telephone Encounter (Signed)
Tried to reach patient. No answer.  

## 2016-10-05 ENCOUNTER — Other Ambulatory Visit (INDEPENDENT_AMBULATORY_CARE_PROVIDER_SITE_OTHER): Payer: Self-pay | Admitting: Specialist

## 2016-10-05 DIAGNOSIS — M48061 Spinal stenosis, lumbar region without neurogenic claudication: Secondary | ICD-10-CM

## 2016-10-05 NOTE — Telephone Encounter (Signed)
Given this behavior needs visit for refill. She is overdue for visit anyway.

## 2016-10-05 NOTE — Telephone Encounter (Signed)
Patient has called back.  States she has found her medication.  Also states that she would like to get pain medication refill.

## 2016-10-10 ENCOUNTER — Encounter: Payer: Self-pay | Admitting: Internal Medicine

## 2016-10-10 ENCOUNTER — Other Ambulatory Visit (INDEPENDENT_AMBULATORY_CARE_PROVIDER_SITE_OTHER): Payer: Medicare Other

## 2016-10-10 ENCOUNTER — Ambulatory Visit (INDEPENDENT_AMBULATORY_CARE_PROVIDER_SITE_OTHER): Payer: Medicare Other | Admitting: Internal Medicine

## 2016-10-10 VITALS — BP 120/82 | HR 95 | Temp 98.3°F | Resp 18 | Ht 64.0 in | Wt 210.0 lb

## 2016-10-10 DIAGNOSIS — R102 Pelvic and perineal pain: Secondary | ICD-10-CM | POA: Diagnosis not present

## 2016-10-10 DIAGNOSIS — G894 Chronic pain syndrome: Secondary | ICD-10-CM

## 2016-10-10 DIAGNOSIS — E119 Type 2 diabetes mellitus without complications: Secondary | ICD-10-CM | POA: Diagnosis not present

## 2016-10-10 LAB — LIPID PANEL
CHOL/HDL RATIO: 2
CHOLESTEROL: 125 mg/dL (ref 0–200)
HDL: 54.4 mg/dL (ref >39.00)
LDL Cholesterol: 62 mg/dL (ref 0–99)
NonHDL: 70.85
TRIGLYCERIDES: 46 mg/dL (ref 0.0–149.0)
VLDL: 9.2 mg/dL (ref 0.0–40.0)

## 2016-10-10 LAB — HEMOGLOBIN A1C: Hgb A1c MFr Bld: 6.4 % (ref 4.6–6.5)

## 2016-10-10 LAB — COMPREHENSIVE METABOLIC PANEL
ALBUMIN: 4.1 g/dL (ref 3.5–5.2)
ALT: 11 U/L (ref 0–35)
AST: 16 U/L (ref 0–37)
Alkaline Phosphatase: 77 U/L (ref 39–117)
BILIRUBIN TOTAL: 0.4 mg/dL (ref 0.2–1.2)
BUN: 14 mg/dL (ref 6–23)
CALCIUM: 9.6 mg/dL (ref 8.4–10.5)
CHLORIDE: 106 meq/L (ref 96–112)
CO2: 29 meq/L (ref 19–32)
Creatinine, Ser: 0.9 mg/dL (ref 0.40–1.20)
GFR: 78.62 mL/min (ref >60.00)
Glucose, Bld: 106 mg/dL — ABNORMAL HIGH (ref 70–99)
Potassium: 4 mEq/L (ref 3.5–5.1)
Sodium: 144 mEq/L (ref 135–145)
Total Protein: 7 g/dL (ref 6.0–8.3)

## 2016-10-10 MED ORDER — HYDROCODONE-ACETAMINOPHEN 5-325 MG PO TABS: 1.0000 | ORAL_TABLET | Freq: Every day | ORAL | 0 refills | Status: DC | PRN
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
## 2016-10-10 NOTE — Assessment & Plan Note (Signed)
Overdue for HgA1c check so doing at visit. We did not discuss in depth her sugars or control at this visit. She is taking metformin and lisinopril and statin. Adjust as needed for goal <8 HgA1c. Reminded about the need for eye exam.

## 2016-10-10 NOTE — Progress Notes (Signed)
   Subjective:    Patient ID: [name], female    DOB: Aug 20, 1941, 75 y.o.   MRN: [id number]  HPI The patient is a 75 YO female coming in for follow up of her chronic pain. We increased the amount of her hydrocodone at the last visit to #45 per month so that she could have 1-2 pills daily or 1.5 pills daily. She is now taking 2 pills daily and admits to running out early every month. She is currently out of medication (filled last 09/03/16) and needs a refill. She is seeing orthopedics for her pain as most of it is related to prior replacements or joint injuries. She denies giving medication to anyone. She called in last week with story about lost medication including her hydrocodone. She did later find her medications. She gets her medicines confused. She denies side effects from the hydrocodone. She denies constipation, balance problems, confusion after taking.   Review of Systems  Constitutional: Negative for activity change, appetite change, fatigue, fever and unexpected weight change.  Respiratory: Negative.   Cardiovascular: Negative.   Gastrointestinal: Negative.   Musculoskeletal: Positive for arthralgias, back pain and myalgias. Negative for gait problem, neck pain and neck stiffness.  Skin: Negative.   Neurological: Negative for dizziness, weakness, light-headedness and headaches.  Psychiatric/Behavioral: Positive for decreased concentration. Negative for hallucinations, self-injury, sleep disturbance and suicidal ideas. The patient is not nervous/anxious and is not hyperactive.       Objective:   Physical Exam  Constitutional: She is oriented to person, place, and time. She appears well-developed and well-nourished.  HENT:  Head: Normocephalic and atraumatic.  Eyes: EOM are normal.  Neck: Normal range of motion.  Cardiovascular: Normal rate and regular rhythm.   Pulmonary/Chest: Effort normal and breath sounds normal. No respiratory distress. She has no wheezes.    Abdominal: Soft.  Neurological: She is alert and oriented to person, place, and time.  Skin: Skin is warm and dry.  Psychiatric:  Somewhat confused about her medications during the visit.    Vitals:   10/10/16 1011  BP: 120/82  Pulse: 95  Resp: 18  Temp: 98.3 F (36.8 C)  TempSrc: Oral  SpO2: 98%  Weight: 210 lb (95.3 kg)  Height: 5\' 4"  (1.626 m)      Assessment & Plan:

## 2016-10-10 NOTE — Progress Notes (Signed)
Pre visit review using our clinic review tool, if applicable. No additional management support is needed unless otherwise documented below in the visit note. 

## 2016-10-10 NOTE — Patient Instructions (Addendum)
We have refilled the hydrocodone today. As we discussed last time this is a 30 day supply of medication so you need to take it so that it lasts 30 days. We do monitor with urine tests from time to time so if you do not have the medicine in your system we cannot continue to refill the medicine.   We have gone over our contract for getting these medications today.  We need to check the blood work today for the sugar levels.

## 2016-10-26 ENCOUNTER — Ambulatory Visit (INDEPENDENT_AMBULATORY_CARE_PROVIDER_SITE_OTHER): Payer: Medicare Other | Admitting: Specialist

## 2016-11-01 ENCOUNTER — Encounter (INDEPENDENT_AMBULATORY_CARE_PROVIDER_SITE_OTHER): Payer: Self-pay | Admitting: *Deleted

## 2016-11-02 ENCOUNTER — Other Ambulatory Visit: Payer: Self-pay | Admitting: Internal Medicine

## 2016-11-04 ENCOUNTER — Telehealth: Payer: Self-pay | Admitting: *Deleted

## 2016-11-04 DIAGNOSIS — R102 Pelvic and perineal pain: Secondary | ICD-10-CM

## 2016-11-04 NOTE — Telephone Encounter (Signed)
Left msg on triage requesting refill on her Hydrocodone.../lmb 

## 2016-11-07 MED ORDER — HYDROCODONE-ACETAMINOPHEN 5-325 MG PO TABS: 1.0000 | ORAL_TABLET | Freq: Every day | ORAL | 0 refills | Status: DC | PRN

## 2016-11-07 NOTE — Telephone Encounter (Signed)
Can be picked up today. Not due until 11/09/16. Needs UDS

## 2016-11-07 NOTE — Telephone Encounter (Signed)
Called pt no answer LMOM rx ready for pick-up.../lmb 

## 2016-11-09 ENCOUNTER — Encounter: Payer: Self-pay | Admitting: Internal Medicine

## 2016-11-09 ENCOUNTER — Telehealth: Payer: Self-pay | Admitting: Geriatric Medicine

## 2016-11-09 DIAGNOSIS — Z79899 Other long term (current) drug therapy: Secondary | ICD-10-CM | POA: Diagnosis not present

## 2016-11-09 NOTE — Telephone Encounter (Signed)
Left message on voice mail informing patient that she has a form at the front ready for pick up.

## 2016-11-21 ENCOUNTER — Encounter (INDEPENDENT_AMBULATORY_CARE_PROVIDER_SITE_OTHER): Payer: Self-pay | Admitting: Specialist

## 2016-11-21 ENCOUNTER — Ambulatory Visit (INDEPENDENT_AMBULATORY_CARE_PROVIDER_SITE_OTHER): Payer: Medicare Other | Admitting: Specialist

## 2016-11-21 VITALS — BP 132/72 | HR 83 | Temp 96.9°F | Ht 64.0 in | Wt 210.0 lb

## 2016-11-21 DIAGNOSIS — R102 Pelvic and perineal pain: Secondary | ICD-10-CM

## 2016-11-21 DIAGNOSIS — I872 Venous insufficiency (chronic) (peripheral): Secondary | ICD-10-CM | POA: Diagnosis not present

## 2016-11-21 DIAGNOSIS — M7989 Other specified soft tissue disorders: Secondary | ICD-10-CM

## 2016-11-21 MED ORDER — HYDROCODONE-ACETAMINOPHEN 5-325 MG PO TABS: 1.0000 | ORAL_TABLET | Freq: Every day | ORAL | 0 refills | Status: DC | PRN

## 2016-11-21 NOTE — Patient Instructions (Addendum)
    Keep short leg dressing dry. May use water impervious bag or cast bag and tub chair to shower Tape the top of bag to skin to avoid moisture soaking the dressing on the leg. Call if there is odor or saturation of dressing or worsening pain not controlled with medications. Call if fever greater than 101.5.  Please follow up with an appointment with Dr. Louanne Skye  1 weeks from the time of surgery. Elevate as often as possible during the first week  If swelling recurrs then elevate again. Return in one week with your compression stocking so that you  Can have the current compression dressing removed and then go into the compression stocking for venous stasis. Avoid motrin, alleve and other NSAIDS these cause fluid retention. Contact your primary care doctor to be sure that there is no problem with hypertension, kidney, heart or liver disease. Take hydrocodone for pain.

## 2016-11-21 NOTE — Progress Notes (Signed)
Office Visit Note   Patient: [name]           Date of Birth: [birth year]           MRN: [id number] Visit Date: 11/21/2016              Requested by: Hoyt Koch, MD [place], [place] [id number] PCP: Hoyt Koch, MD   Assessment & Plan: Visit Diagnoses:  1. Venous stasis dermatitis without varicosities   2. Pelvic pain in female   Dynnaflex compression dressing applied to the right leg for venous stasis.  Plan: Keep short leg dressing dry. May use water impervious bag or cast bag and tub chair to shower Tape the top of bag to skin to avoid moisture soaking the dressing on the leg. Call if there is odor or saturation of dressing or worsening pain not controlled with medications. Call if fever greater than 101.5.  Please follow up with an appointment with Dr. Louanne Skye  1 weeks from the time of surgery. Elevate as often as possible during the first week  If swelling recurrs then elevate again. Return in one week with your compression stocking so that you  Can have the current compression dressing removed and then go into the compression stocking for venous stasis. Avoid motrin, alleve and other NSAIDS these cause fluid retention. Contact your primary care doctor to be sure that there is no problem with hypertension, kidney, heart or liver disease. Take hydrocodone for pain.   Follow-Up Instructions: Return in about 1 week (around 11/28/2016) for Right leg swelling.   Orders:  No orders of the defined types were placed in this encounter.  Meds ordered this encounter  Medications  . HYDROcodone-acetaminophen (NORCO/VICODIN) 5-325 MG tablet    Sig: Take 1-2 tablets by mouth daily as needed for moderate pain.    Dispense:  45 tablet    Refill:  0    This is a 30 day supply.      Procedures: No procedures performed   Clinical Data: Findings:  Doppler venous studies 04/2016 negative for DVT    Subjective: Chief Complaint    Patient presents with  . Lower Back - Pain, Follow-up  . Right Hip - Pain    Patient is here for repeat evaluation of lower back and right hip. She was last seen  She is taking gabapentin 100mg  tablets. She feels this does give her some relief. But the pain in her back and right hip are persistent. She has difficulty with prolonged sitting and sleeping. She has a hard time getting into a comfortable position. She was suppose to follow up for EMG studies. She has not had these done yet, she is scheduled on 11/24/16.Has swelling and pain in the right leg, had doppler studies by primary care MD 04/2016 these were negative. Started on compression stockings for the right leg below the right knee but stopped using them due to leg pain and pressure on the right leg below the right knee. Dr. Terrence Dupont is her cardiologist, has not been checked by cardiologist.S/P right hip fx treated by Dr. Darliss Cheney in 1989, bilateral total knee replacements by Dr. Veverly Fells in 2005 and 2006. SHe has had lumbar surgery by myself in 2005. C/o Numbness and Tingling in right leg, sitting notices worsening of the right leg and feels like she would fall. Had increase in her activity recently walking her 6 month grandchild.     Review of Systems  Constitutional: Negative for activity change, appetite change and unexpected weight change.  HENT: Negative.   Eyes: Negative.   Respiratory: Negative for apnea, cough, choking, chest tightness and shortness of breath.   Cardiovascular: Positive for leg swelling. Negative for chest pain and palpitations.  Gastrointestinal: Negative.   Endocrine: Negative.   Genitourinary: Negative.   Musculoskeletal: Positive for arthralgias, back pain, gait problem, joint swelling and myalgias.  Allergic/Immunologic: Negative.   Neurological: Positive for weakness and numbness.  Hematological: Negative.   Psychiatric/Behavioral: Negative.      Objective: Vital Signs: BP 132/72   Pulse 83   Temp  (!) 96.9 F (36.1 C)   Ht 5\' 4"  (1.626 m)   Wt 210 lb (95.3 kg)   BMI 36.05 kg/m   Physical Exam  Constitutional: She is oriented to person, place, and time. She appears well-developed and well-nourished.  HENT:  Head: Normocephalic and atraumatic.  Eyes: EOM are normal. Pupils are equal, round, and reactive to light.  Neck: Normal range of motion. Neck supple.  Pulmonary/Chest: Effort normal and breath sounds normal.  Abdominal: Soft. Bowel sounds are normal.  Musculoskeletal: She exhibits edema.  Neurological: She is alert and oriented to person, place, and time.  Skin: Skin is warm and dry.  Psychiatric: She has a normal mood and affect. Her behavior is normal. Judgment and thought content normal.    Right Knee Exam  Right knee exam is normal.  Other  Erythema: present Scars: present Sensation: normal Pulse: present Swelling: severe  Comments:  Right calf is 3 inches in diameter compared with the left. Right Thigh circumference in 1.25 inches greater.    Left Knee Exam   Comments:  No swelling      Specialty Comments:  No specialty comments available.  Imaging: No results found.   PMFS History: Patient Active Problem List   Diagnosis Date Noted  . Chronic pain syndrome 08/04/2015  . Morbid obesity ([place]) 04/07/2015  . Osteoarthritis 04/07/2015  . Polypharmacy 02/13/2015  . Atrial fibrillation ([place]) 11/06/2014  . Elevated d-dimer 11/06/2014  . Diabetes mellitus type 2, controlled, without complications ([place]) 0000000  . CAD (coronary artery disease), native coronary artery 11/06/2014  . Essential hypertension 11/06/2014  . Asthma in adult without complication 0000000  . Arthritis 04/07/2014   Past Medical History:  Diagnosis Date  . Acute MI   . Arthritis   . Asthma   . Atrial fibrillation ([place])   . Diabetes mellitus   . DVT (deep venous thrombosis) ([place])   . Gout   . Gout   . Stroke [facility]     Family History  Problem Relation Age of  Onset  . Cancer Brother   . Cancer Mother     Past Surgical History:  Procedure Laterality Date  . BACK SURGERY Left 2003   left knee replacement and left shoulder  . FOOT SURGERY    . JOINT REPLACEMENT    . SHOULDER SURGERY     Social History   Occupational History  . Not on file.   Social History Main Topics  . Smoking status: Former Smoker    Packs/day: 0.25    Years: 30.00    Types: Cigarettes  . Smokeless tobacco: Former Systems developer    Quit date: 06/13/2004     Comment: Quit when she went to nursing home/ states she light smoker; Only smoked periodically; Pack years undetermined  . Alcohol use 0.6 oz/week    1 Standard drinks or equivalent per week  Comment: Occasinaly beer with dinner  . Drug use:     Types: Marijuana     Comment: occasionally  . Sexual activity: Not Currently

## 2016-11-24 ENCOUNTER — Encounter: Payer: Medicare Other | Admitting: Neurology

## 2016-11-28 ENCOUNTER — Ambulatory Visit (INDEPENDENT_AMBULATORY_CARE_PROVIDER_SITE_OTHER): Payer: Medicare Other | Admitting: Specialist

## 2016-12-01 ENCOUNTER — Ambulatory Visit (INDEPENDENT_AMBULATORY_CARE_PROVIDER_SITE_OTHER): Payer: Medicare Other | Admitting: Specialist

## 2016-12-06 DIAGNOSIS — I482 Chronic atrial fibrillation: Secondary | ICD-10-CM | POA: Diagnosis not present

## 2016-12-06 DIAGNOSIS — E119 Type 2 diabetes mellitus without complications: Secondary | ICD-10-CM | POA: Diagnosis not present

## 2016-12-06 DIAGNOSIS — I10 Essential (primary) hypertension: Secondary | ICD-10-CM | POA: Diagnosis not present

## 2016-12-06 DIAGNOSIS — I639 Cerebral infarction, unspecified: Secondary | ICD-10-CM | POA: Diagnosis not present

## 2016-12-06 DIAGNOSIS — I2510 Atherosclerotic heart disease of native coronary artery without angina pectoris: Secondary | ICD-10-CM | POA: Diagnosis not present

## 2016-12-13 ENCOUNTER — Telehealth: Payer: Self-pay | Admitting: *Deleted

## 2016-12-13 NOTE — Telephone Encounter (Signed)
Called pt no answer LMOM RTC.../lmb 

## 2016-12-13 NOTE — Telephone Encounter (Signed)
Rec'd call pt requesting refill on her Hydrocodone.../lmb 

## 2016-12-13 NOTE — Telephone Encounter (Signed)
She has received additional prescription from another provider since our last prescription which is a violation of her pain contract. This is a repeat violation for the same thing (last year gyn given additional rx for her) Has she filled that prescription? If she has not she can bring that rx for refill. If she has needs visit for discussion about refill.

## 2016-12-14 NOTE — Telephone Encounter (Signed)
Pt called in explain what dr [name] had said. She said that she went to ortho and he said that he could start giving her meds to control her pain.  She said he could start doing it.  So I asked her if he was going to start taking over her pain meds, why was she needing DR [name] to refill?  She was not really able to answer.  I explained the contract to her.  We setup an appt for her to disuses.

## 2016-12-21 ENCOUNTER — Ambulatory Visit: Payer: Medicare Other | Admitting: Internal Medicine

## 2017-01-06 ENCOUNTER — Other Ambulatory Visit: Payer: Self-pay | Admitting: Internal Medicine

## 2017-01-06 ENCOUNTER — Ambulatory Visit: Payer: Medicare Other | Admitting: Internal Medicine

## 2017-01-11 ENCOUNTER — Ambulatory Visit: Payer: Medicare Other | Admitting: Internal Medicine

## 2017-01-12 ENCOUNTER — Encounter: Payer: Medicare Other | Admitting: Neurology

## 2017-01-17 ENCOUNTER — Ambulatory Visit: Payer: Medicare Other | Admitting: Internal Medicine

## 2017-01-18 ENCOUNTER — Ambulatory Visit (INDEPENDENT_AMBULATORY_CARE_PROVIDER_SITE_OTHER): Payer: Medicare Other | Admitting: Specialist

## 2017-01-18 ENCOUNTER — Encounter (INDEPENDENT_AMBULATORY_CARE_PROVIDER_SITE_OTHER): Payer: Self-pay | Admitting: Specialist

## 2017-01-18 ENCOUNTER — Ambulatory Visit (INDEPENDENT_AMBULATORY_CARE_PROVIDER_SITE_OTHER): Payer: Medicare Other | Admitting: Internal Medicine

## 2017-01-18 ENCOUNTER — Encounter: Payer: Self-pay | Admitting: Internal Medicine

## 2017-01-18 VITALS — BP 137/77 | HR 75 | Ht 64.0 in | Wt 196.0 lb

## 2017-01-18 DIAGNOSIS — I872 Venous insufficiency (chronic) (peripheral): Secondary | ICD-10-CM | POA: Diagnosis not present

## 2017-01-18 DIAGNOSIS — I878 Other specified disorders of veins: Secondary | ICD-10-CM | POA: Diagnosis not present

## 2017-01-18 DIAGNOSIS — G894 Chronic pain syndrome: Secondary | ICD-10-CM

## 2017-01-18 DIAGNOSIS — R102 Pelvic and perineal pain: Secondary | ICD-10-CM

## 2017-01-18 DIAGNOSIS — L97211 Non-pressure chronic ulcer of right calf limited to breakdown of skin: Secondary | ICD-10-CM | POA: Diagnosis not present

## 2017-01-18 DIAGNOSIS — E1151 Type 2 diabetes mellitus with diabetic peripheral angiopathy without gangrene: Secondary | ICD-10-CM

## 2017-01-18 MED ORDER — CADEXOMER IODINE 0.9 % EX GEL: 1.0000 "application " | Freq: Every day | CUTANEOUS | 0 refills | Status: DC | PRN

## 2017-01-18 MED ORDER — LISINOPRIL 10 MG PO TABS: 10.0000 mg | ORAL_TABLET | Freq: Every day | ORAL | 3 refills | Status: DC

## 2017-01-18 MED ORDER — ATENOLOL 50 MG PO TABS: 50.0000 mg | ORAL_TABLET | Freq: Every day | ORAL | 3 refills | Status: DC

## 2017-01-18 MED ORDER — CADEXOMER IODINE 0.9 % EX GEL: CUTANEOUS | Status: DC

## 2017-01-18 MED ORDER — HYDROCODONE-ACETAMINOPHEN 5-325 MG PO TABS: 1.0000 | ORAL_TABLET | Freq: Every day | ORAL | 0 refills | Status: DC | PRN

## 2017-01-18 NOTE — Progress Notes (Signed)
Office Visit Note   Patient: [name]           Date of Birth: [birth year]           MRN: [id number] Visit Date: 01/18/2017              Requested by: Alejandra Koch, MD [place], [place] [id number] PCP: Alejandra Koch, MD   Assessment & Plan: Visit Diagnoses:  1. Venous stasis ulcer of right calf limited to breakdown of skin without varicose veins (HCC)   2. Venous stasis of lower extremity   3. Pelvic pain in female   4. Venous stasis dermatitis without varicosities   5. Peripheral vascular disease in diabetes mellitus (HCC)     Plan:Keep the feet warm, wear warm clothing and socks and shoes. See hospital for testing of the arteries in the right leg. Apply iodosorb to right calf ulcer and a bandaid. Hydrocodone for pain in the leg and foot.  Follow-Up Instructions: Return in about 2 years (around 01/18/2019).   Orders:  No orders of the defined types were placed in this encounter.  No orders of the defined types were placed in this encounter.     Procedures: No procedures performed   Clinical Data: No additional findings.   Subjective: Chief Complaint  Patient presents with  . Lower Back - Pain  . Right Leg - Edema    [name] is here to follow up on her back and right leg pain and swelling.  She states that her back is still hurting her and she has called everywhere to find her a back brace and she can't get one.  She is also complaining of Right lef swelling.  When she was here last we had put her in a dyna flex wrap. She states that the wrap was to tight and took it off 2 days after her appointment.  She says it caused a sore on the back of her ankle and says that her foot is changing colors. She says that she has been doctoring on it at home with her own first aid supplies.    Review of Systems  Constitutional: Negative.   HENT: Negative.   Eyes: Negative.   Respiratory: Negative.   Cardiovascular: Negative.    Gastrointestinal: Negative.   Endocrine: Negative.   Genitourinary: Negative.   Musculoskeletal: Negative.   Skin: Negative.   Allergic/Immunologic: Negative.   Neurological: Negative.   Hematological: Negative.   Psychiatric/Behavioral: Negative.      Objective: Vital Signs: BP 137/77 (BP Location: Left Arm, Patient Position: Sitting)   Pulse 75   Ht 5\' 4"  (1.626 m)   Wt 196 lb (88.9 kg)   BMI 33.64 kg/m   Physical Exam  Constitutional: She is oriented to person, place, and time. She appears well-developed and well-nourished.  HENT:  Head: Normocephalic and atraumatic.  Eyes: EOM are normal. Pupils are equal, round, and reactive to light.  Neck: Normal range of motion. Neck supple.  Pulmonary/Chest: Effort normal and breath sounds normal.  Abdominal: Soft. Bowel sounds are normal.  Musculoskeletal: Normal range of motion.  Neurological: She is alert and oriented to person, place, and time.  Skin: Skin is warm and dry.  Psychiatric: She has a normal mood and affect. Her behavior is normal. Judgment and thought content normal.    Right Ankle Exam   Range of Motion  Dorsiflexion: normal  Plantar flexion: normal  Inversion: normal  Eversion: normal  Muscle Strength  Dorsiflexion:  5/5 Plantar flexion:  5/5 Anterior tibial:  5/5 Posterior tibial:  5/5 Gastrocsoleus:  5/5 Peroneal muscle:  5/5  Tests  Anterior drawer: negative Varus tilt: negative  Other  Erythema: absent Scars: absent Sensation: decreased Pulse: absent   Comments:  Right foot with coolness diffuse toes to the ankle. Darken skin toes to the right distal leg and ankle. Right calf swelling with venous stasis. Right DP and PT is absent.   Left Ankle Exam   Other  Erythema: absent Scars: absent Pulse: present      Specialty Comments:  No specialty comments available.  Imaging: No results found.   PMFS History: Patient Active Problem List   Diagnosis Date Noted  . Chronic  pain syndrome 08/04/2015  . Morbid obesity ([place]) 04/07/2015  . Osteoarthritis 04/07/2015  . Polypharmacy 02/13/2015  . Atrial fibrillation ([place]) 11/06/2014  . Elevated d-dimer 11/06/2014  . Diabetes mellitus type 2, controlled, without complications ([place]) 0000000  . CAD (coronary artery disease), native coronary artery 11/06/2014  . Essential hypertension 11/06/2014  . Asthma in adult without complication 0000000  . Arthritis 04/07/2014   Past Medical History:  Diagnosis Date  . Acute MI   . Arthritis   . Asthma   . Atrial fibrillation ([place])   . Diabetes mellitus   . DVT (deep venous thrombosis) ([place])   . Gout   . Gout   . Stroke [facility])     Family History  Problem Relation Age of Onset  . Cancer Brother   . Cancer Mother     Past Surgical History:  Procedure Laterality Date  . BACK SURGERY Left 2003   left knee replacement and left shoulder  . FOOT SURGERY    . JOINT REPLACEMENT    . SHOULDER SURGERY     Social History   Occupational History  . Not on file.   Social History Main Topics  . Smoking status: Former Smoker    Packs/day: 0.25    Years: 30.00    Types: Cigarettes  . Smokeless tobacco: Former Systems developer    Quit date: 06/13/2004     Comment: Quit when she went to nursing home/ states she light smoker; Only smoked periodically; Pack years undetermined  . Alcohol use 0.6 oz/week    1 Standard drinks or equivalent per week     Comment: Occasinaly beer with dinner  . Drug use: Yes    Types: Marijuana     Comment: occasionally  . Sexual activity: Not Currently

## 2017-01-18 NOTE — Progress Notes (Signed)
Pre visit review using our clinic review tool, if applicable. No additional management support is needed unless otherwise documented below in the visit note. 

## 2017-01-18 NOTE — Assessment & Plan Note (Signed)
She has violated her pain contract for a second time and was seeking extra pain medication. She will not be prescribed hydrocodone from [place] any longer as she states Dr. Louanne Skye has agreed to do this. No rx given today and she should follow up with Dr. Louanne Skye.

## 2017-01-18 NOTE — Patient Instructions (Signed)
We have sent in the refills today for the medications.   Ask Dr. Louanne Skye for the refill of the hydrocodone today when you see him today.

## 2017-01-18 NOTE — Patient Instructions (Addendum)
Keep the feet warm, wear warm clothing and socks and shoes. See hospital for testing of the arteries in the right leg. Apply iodosorb to right calf ulcer and a bandaid. hydrocodone for pain in the leg and foot.

## 2017-01-18 NOTE — Addendum Note (Signed)
Addended by: Basil Dess on: 01/18/2017 01:20 PM   Modules accepted: Orders

## 2017-01-18 NOTE — Progress Notes (Signed)
   Subjective:    Patient ID: [name], female    DOB: [birth year], 76 y.o.   MRN: [id number]  HPI The patient is a 76 YO female coming in for concerns about why we would not refill her hydrocodone. She did see her orthopedic doctor about 1-2 months ago and he gave her prescription for hydrocodone. She does have pain contract with [place] which explicitly states that she should not get pain medication from other providers. She did violate this about 1 year ago when she got pain medication from her OB/Gyn and at that time we sat down with her and explained that she was not allowed to do this and continue to get pain medication from [place]. She then called [place] the next day from getting the hydrocodone from her orthopedic to get a refill from [place]. She was informed that she could not do that since she was asking 1 day after filling meds from another doctor (early fill and violation of contract). She now understands and states that Dr. Louanne Skye is willing to prescribe her pain medication and she has a visit with him this morning.   Review of Systems  Constitutional: Negative.   Respiratory: Negative.   Cardiovascular: Negative.   Gastrointestinal: Negative.   Musculoskeletal: Positive for arthralgias and back pain. Negative for gait problem, joint swelling, myalgias and neck pain.  Skin: Negative.   Neurological: Negative.       Objective:   Physical Exam  Constitutional: She is oriented to person, place, and time. She appears well-developed and well-nourished.  HENT:  Head: Normocephalic and atraumatic.  Eyes: EOM are normal.  Neck: Normal range of motion.  Cardiovascular: Normal rate and regular rhythm.   Pulmonary/Chest: Effort normal. No respiratory distress. She has no wheezes. She has no rales.  Abdominal: Soft.  Neurological: She is alert and oriented to person, place, and time.  Skin: Skin is warm and dry.   Vitals:   01/18/17 0821  BP: (!) 150/90  Pulse: 93  Resp: 16  Temp: 98.3  F (36.8 C)  TempSrc: Oral  SpO2: 100%  Weight: 199 lb (90.3 kg)  Height: 5\' 4"  (1.626 m)      Assessment & Plan:

## 2017-01-19 ENCOUNTER — Ambulatory Visit (HOSPITAL_COMMUNITY)
Admission: RE | Admit: 2017-01-19 | Discharge: 2017-01-19 | Disposition: A | Discharge status: Home / Self Care | Payer: Medicare Other | Source: Ambulatory Visit | Attending: Specialist | Admitting: Specialist

## 2017-01-19 DIAGNOSIS — E1151 Type 2 diabetes mellitus with diabetic peripheral angiopathy without gangrene: Secondary | ICD-10-CM

## 2017-01-19 NOTE — Progress Notes (Signed)
VASCULAR LAB PRELIMINARY  ARTERIAL  ABI completed:  The lower extremity arterial evaluation indicates mild peripheral arterial disease bilaterally with monophasic waveforms.  Bilateral TBI's are abnormal.    RIGHT    LEFT    PRESSURE WAVEFORM  PRESSURE WAVEFORM  BRACHIAL 169 Tri BRACHIAL 172 Tri  DP 137 Mono DP 159 Mono  PT 145 Mono PT 162 Mono  GREAT TOE 46 NA GREAT TOE 72 NA    RIGHT LEFT  ABI 0.84 0.94     Tonnie Friedel Arie Sabina, RVT 01/19/2017, 3:46 PM

## 2017-01-27 ENCOUNTER — Ambulatory Visit: Payer: Medicare Other | Admitting: Obstetrics

## 2017-02-02 ENCOUNTER — Ambulatory Visit (INDEPENDENT_AMBULATORY_CARE_PROVIDER_SITE_OTHER): Payer: Medicare Other

## 2017-02-02 ENCOUNTER — Encounter (INDEPENDENT_AMBULATORY_CARE_PROVIDER_SITE_OTHER): Payer: Self-pay | Admitting: Specialist

## 2017-02-02 ENCOUNTER — Ambulatory Visit (INDEPENDENT_AMBULATORY_CARE_PROVIDER_SITE_OTHER): Payer: Medicare Other | Admitting: Specialist

## 2017-02-02 VITALS — BP 141/79 | HR 74 | Ht 64.0 in | Wt 199.0 lb

## 2017-02-02 DIAGNOSIS — I872 Venous insufficiency (chronic) (peripheral): Secondary | ICD-10-CM

## 2017-02-02 DIAGNOSIS — R200 Anesthesia of skin: Secondary | ICD-10-CM | POA: Diagnosis not present

## 2017-02-02 DIAGNOSIS — M4317 Spondylolisthesis, lumbosacral region: Secondary | ICD-10-CM

## 2017-02-02 DIAGNOSIS — M5441 Lumbago with sciatica, right side: Secondary | ICD-10-CM

## 2017-02-02 DIAGNOSIS — R29898 Other symptoms and signs involving the musculoskeletal system: Secondary | ICD-10-CM | POA: Diagnosis not present

## 2017-02-02 DIAGNOSIS — R102 Pelvic and perineal pain: Secondary | ICD-10-CM | POA: Diagnosis not present

## 2017-02-02 DIAGNOSIS — M62542 Muscle wasting and atrophy, not elsewhere classified, left hand: Secondary | ICD-10-CM | POA: Diagnosis not present

## 2017-02-02 DIAGNOSIS — R202 Paresthesia of skin: Secondary | ICD-10-CM

## 2017-02-02 DIAGNOSIS — E1142 Type 2 diabetes mellitus with diabetic polyneuropathy: Secondary | ICD-10-CM | POA: Diagnosis not present

## 2017-02-02 DIAGNOSIS — G8929 Other chronic pain: Secondary | ICD-10-CM

## 2017-02-02 MED ORDER — HYDROCODONE-ACETAMINOPHEN 5-325 MG PO TABS: 1.0000 | ORAL_TABLET | Freq: Every day | ORAL | 0 refills | Status: DC | PRN

## 2017-02-02 NOTE — Patient Instructions (Addendum)
Avoid bending, stooping and avoid lifting weights greater than 10 lbs. Avoid prolong standing and walking. Avoid frequent bending and stooping  No lifting greater than 10 lbs. May use ice or moist heat for pain. Weight loss is of benefit. Handicap license is approved.  

## 2017-02-02 NOTE — Progress Notes (Signed)
Office Visit Note   Patient: [name]           Date of Birth: Feb 22, 1941           MRN: [id number] Visit Date: 02/02/2017              Requested by: Hoyt Koch, MD [place], [place] [id number] PCP: Hoyt Koch, MD   Assessment & Plan: Visit Diagnoses:  1. Numbness and tingling of right leg   2. Right leg weakness   3. Chronic right-sided low back pain with right-sided sciatica   4. Muscle wasting and atrophy, not elsewhere classified, left hand   5. Spondylolisthesis, lumbosacral region     Plan:Avoid bending, stooping and avoid lifting weights greater than 10 lbs. Avoid prolong standing and walking. Avoid frequent bending and stooping  No lifting greater than 10 lbs. May use ice or moist heat for pain. Weight loss is of benefit. Handicap license is approved.  Follow-Up Instructions: Return in about 4 weeks (around 03/02/2017).   Orders:  Orders Placed This Encounter  Procedures  . XR Lumbar Spine Complete W/Bend   No orders of the defined types were placed in this encounter.     Procedures: No procedures performed   Clinical Data: No additional findings.   Subjective: Chief Complaint  Patient presents with  . Right Leg - Follow-up    Ms. [name] is here to follow up on her right lower extermity venous stasis insuffiencey.  She states that the swelling in the leg has gone down and the wound is looking better. She is using iodosorb on it.  She is out of it but her insurance will not cover it.Notices left hand wasting of the thenar eminence, history of previous right CHI post rt side Decompression for subdural hematoma, Dr. Arnoldo Morale. Right leg numbness and pins and needles sensation, increases with standing and walking. Has diabetes, but is well controlled. Notices neck stiffness and decreased ROM.    Review of Systems  Constitutional: Negative.   HENT: Negative.   Eyes: Negative.   Respiratory: Negative.     Cardiovascular: Negative.   Gastrointestinal: Negative.   Endocrine: Negative.   Genitourinary: Negative.   Musculoskeletal: Negative.   Skin: Negative.   Allergic/Immunologic: Negative.   Neurological: Negative.   Hematological: Negative.   Psychiatric/Behavioral: Negative.      Objective: Vital Signs: BP (!) 141/79 (BP Location: Left Arm, Patient Position: Sitting)   Pulse 74   Ht 5\' 4"  (1.626 m)   Wt 199 lb (90.3 kg)   BMI 34.16 kg/m   Physical Exam  Constitutional: She is oriented to person, place, and time. She appears well-developed and well-nourished.  HENT:  Head: Normocephalic and atraumatic.  Eyes: EOM are normal. Pupils are equal, round, and reactive to light.  Neck: Normal range of motion. Neck supple.  Pulmonary/Chest: Effort normal and breath sounds normal.  Abdominal: Soft. Bowel sounds are normal.  Neurological: She is alert and oriented to person, place, and time.  Skin: Skin is warm and dry.  Psychiatric: She has a normal mood and affect. Her behavior is normal. Judgment and thought content normal.    Back Exam   Tenderness  The patient is experiencing tenderness in the cervical and lumbar.  Range of Motion  Extension: abnormal  Flexion: abnormal  Lateral Bend Right: abnormal  Lateral Bend Left: abnormal  Rotation Right: abnormal  Rotation Left: abnormal   Muscle Strength  Right Quadriceps:  5/5  Left Quadriceps:  5/5  Right Hamstrings:  5/5  Left Hamstrings:  5/5   Tests  Straight leg raise right: negative Straight leg raise left: negative  Reflexes  Patellar:  2/4 abnormal Achilles:  2/4 abnormal Biceps:  2/4 abnormal Babinski's sign: abnormal   Other  Toe Walk: abnormal Heel Walk: abnormal Sensation: decreased Gait: antalgic   Comments:  Right foot discoloration, 3cm2 area of eschar back of the right heel cord, no drainage or erythrema.   Left Hand Exam   Other  Erythema: absent Scars: absent Sensation:  decreased Pulse: present  Comments:  Left hand thenar wasting.      Specialty Comments:  No specialty comments available.  Imaging: No results found.   PMFS History: Patient Active Problem List   Diagnosis Date Noted  . Chronic pain syndrome 08/04/2015  . Morbid obesity ([place]) 04/07/2015  . Osteoarthritis 04/07/2015  . Polypharmacy 02/13/2015  . Atrial fibrillation ([place]) 11/06/2014  . Elevated d-dimer 11/06/2014  . Diabetes mellitus type 2, controlled, without complications ([place]) 0000000  . CAD (coronary artery disease), native coronary artery 11/06/2014  . Essential hypertension 11/06/2014  . Asthma in adult without complication 0000000  . Arthritis 04/07/2014   Past Medical History:  Diagnosis Date  . Acute MI   . Arthritis   . Asthma   . Atrial fibrillation ([place])   . Diabetes mellitus   . DVT (deep venous thrombosis) ([place])   . Gout   . Gout   . Stroke [facility])     Family History  Problem Relation Age of Onset  . Cancer Brother   . Cancer Mother     Past Surgical History:  Procedure Laterality Date  . BACK SURGERY Left 2003   left knee replacement and left shoulder  . FOOT SURGERY    . JOINT REPLACEMENT    . SHOULDER SURGERY     Social History   Occupational History  . Not on file.   Social History Main Topics  . Smoking status: Former Smoker    Packs/day: 0.25    Years: 30.00    Types: Cigarettes  . Smokeless tobacco: Former Systems developer    Quit date: 06/13/2004     Comment: Quit when she went to nursing home/ states she light smoker; Only smoked periodically; Pack years undetermined  . Alcohol use 0.6 oz/week    1 Standard drinks or equivalent per week     Comment: Occasinaly beer with dinner  . Drug use: Yes    Types: Marijuana     Comment: occasionally  . Sexual activity: Not Currently

## 2017-02-06 ENCOUNTER — Other Ambulatory Visit: Payer: Self-pay | Admitting: Obstetrics

## 2017-02-06 DIAGNOSIS — Z1231 Encounter for screening mammogram for malignant neoplasm of breast: Secondary | ICD-10-CM

## 2017-02-23 ENCOUNTER — Telehealth: Payer: Self-pay

## 2017-02-23 ENCOUNTER — Encounter: Payer: Medicare Other | Admitting: Neurology

## 2017-02-23 NOTE — Telephone Encounter (Signed)
Pt called and cancelled same day EMG b/c her transportation did not show.

## 2017-02-23 NOTE — Telephone Encounter (Signed)
Pt called back, said she is can't get thru to Dr Otho Ket office. I advised her to keep trying, she said ok  FYI

## 2017-02-24 ENCOUNTER — Encounter: Payer: Self-pay | Admitting: Neurology

## 2017-02-27 ENCOUNTER — Telehealth (INDEPENDENT_AMBULATORY_CARE_PROVIDER_SITE_OTHER): Payer: Self-pay | Admitting: Specialist

## 2017-02-27 NOTE — Telephone Encounter (Signed)
Patient called advised she missed her appt. At [facility] and need to be rescheduled. Patient said she missed the appointment because of the weather. Patient said she have  been trying to ger rescheduled for sometime now. The number to contact patient is [phone]

## 2017-02-28 NOTE — Telephone Encounter (Signed)
LMTRC to pt, pending call back

## 2017-03-07 DIAGNOSIS — I482 Chronic atrial fibrillation: Secondary | ICD-10-CM | POA: Diagnosis not present

## 2017-03-07 DIAGNOSIS — E119 Type 2 diabetes mellitus without complications: Secondary | ICD-10-CM | POA: Diagnosis not present

## 2017-03-07 DIAGNOSIS — I10 Essential (primary) hypertension: Secondary | ICD-10-CM | POA: Diagnosis not present

## 2017-03-07 DIAGNOSIS — I2510 Atherosclerotic heart disease of native coronary artery without angina pectoris: Secondary | ICD-10-CM | POA: Diagnosis not present

## 2017-03-07 DIAGNOSIS — I639 Cerebral infarction, unspecified: Secondary | ICD-10-CM | POA: Diagnosis not present

## 2017-03-09 NOTE — Telephone Encounter (Signed)
Unable to reach pt

## 2017-03-14 ENCOUNTER — Encounter: Payer: Self-pay | Admitting: Obstetrics

## 2017-03-14 ENCOUNTER — Other Ambulatory Visit (HOSPITAL_COMMUNITY)
Admission: RE | Admit: 2017-03-14 | Discharge: 2017-03-14 | Disposition: A | Discharge status: Home / Self Care | Payer: Medicare Other | Source: Ambulatory Visit | Attending: Obstetrics | Admitting: Obstetrics

## 2017-03-14 ENCOUNTER — Ambulatory Visit (INDEPENDENT_AMBULATORY_CARE_PROVIDER_SITE_OTHER): Payer: Medicare Other | Admitting: Obstetrics

## 2017-03-14 VITALS — BP 127/87 | HR 110 | Wt 194.0 lb

## 2017-03-14 DIAGNOSIS — Z0000 Encounter for general adult medical examination without abnormal findings: Secondary | ICD-10-CM | POA: Diagnosis not present

## 2017-03-14 DIAGNOSIS — Z124 Encounter for screening for malignant neoplasm of cervix: Secondary | ICD-10-CM

## 2017-03-14 DIAGNOSIS — Z01419 Encounter for gynecological examination (general) (routine) without abnormal findings: Secondary | ICD-10-CM

## 2017-03-14 DIAGNOSIS — B369 Superficial mycosis, unspecified: Secondary | ICD-10-CM

## 2017-03-14 LAB — POCT URINALYSIS DIPSTICK
BILIRUBIN UA: NEGATIVE
GLUCOSE UA: NEGATIVE
Ketones, UA: NEGATIVE
Leukocytes, UA: NEGATIVE
NITRITE UA: NEGATIVE
PH UA: 7 (ref 5.0–8.0)
Protein, UA: NEGATIVE
SPEC GRAV UA: 1.01 (ref 1.030–1.035)
Urobilinogen, UA: 0.2 (ref ?–2.0)

## 2017-03-14 MED ORDER — CLOTRIMAZOLE 1 % EX CREA: 1.0000 "application " | TOPICAL_CREAM | Freq: Two times a day (BID) | CUTANEOUS | 1 refills | Status: DC

## 2017-03-14 NOTE — Progress Notes (Signed)
Pt presents for annual and pap smear. Pt c/o dysuria and vaginal irriatioin. Has upcoming [facility] appt and thinks colonoscopy was last year. Last bone Xray 02/02/17.

## 2017-03-14 NOTE — Progress Notes (Signed)
Subjective:        [name] is a 76 y.o. female here for a routine exam.  Current complaints: Burning type irritation in vaginal area.    Personal health questionnaire:  Is patient Ashkenazi Jewish, have a family history of breast and/or ovarian cancer: no Is there a family history of uterine cancer diagnosed at age < 24, gastrointestinal cancer, urinary tract cancer, family member who is a Field seismologist syndrome-associated carrier: no Is the patient overweight and hypertensive, family history of diabetes, personal history of gestational diabetes, preeclampsia or PCOS: no Is patient over 54, have PCOS,  family history of premature CHD under age 40, diabetes, smoke, have hypertension or peripheral artery disease:  no At any time, has a partner hit, kicked or otherwise hurt or frightened you?: no Over the past 2 weeks, have you felt down, depressed or hopeless?: no Over the past 2 weeks, have you felt little interest or pleasure in doing things?:no   Gynecologic History No LMP recorded. Patient is postmenopausal. Contraception: post menopausal status Last Pap: 2017. Results were: normal Last mammogram: 2017. Results were: normal  Obstetric History OB History  No data available    Past Medical History:  Diagnosis Date  . Acute MI   . Arthritis   . Asthma   . Atrial fibrillation ([place])   . Diabetes mellitus   . DVT (deep venous thrombosis) ([place])   . Gout   . Gout   . Stroke [facility])     Past Surgical History:  Procedure Laterality Date  . BACK SURGERY Left 2003   left knee replacement and left shoulder  . FOOT SURGERY    . JOINT REPLACEMENT    . SHOULDER SURGERY       Current Outpatient Prescriptions:  .  albuterol (VENTOLIN HFA) 108 (90 BASE) MCG/ACT inhaler, Inhale 2 puffs into the lungs every 6 (six) hours as needed for wheezing or shortness of breath (SOB)., Disp: , Rfl:  .  allopurinol (ZYLOPRIM) 100 MG tablet, TAKE 1 TABLET (100 MG TOTAL) BY MOUTH DAILY.,  Disp: 90 tablet, Rfl: 3 .  atenolol (TENORMIN) 50 MG tablet, Take 1 tablet (50 mg total) by mouth daily., Disp: 90 tablet, Rfl: 3 .  cadexomer iodine (IODOSORB) 0.9 % gel, Apply 1 application topically daily as needed for wound care., Disp: 40 g, Rfl: 0 .  DULoxetine (CYMBALTA) 60 MG capsule, TAKE 1 CAPSULE (60 MG TOTAL) BY MOUTH DAILY., Disp: 90 capsule, Rfl: 3 .  ELIQUIS 5 MG TABS tablet, TAKE 1 TABLET (5 MG TOTAL) BY MOUTH TWO   (TWO) TIMES DAILY., Disp: 180 tablet, Rfl: 1 .  Fluticasone-Salmeterol (ADVAIR) 100-50 MCG/DOSE AEPB, Inhale 1 puff into the lungs 2 (two) times daily., Disp: , Rfl:  .  furosemide (LASIX) 40 MG tablet, TAKE 1 TABLET (40 MG TOTAL) BY MOUTH DAILY AS NEEDED (LEG SWELLING)., Disp: 90 tablet, Rfl: 1 .  gabapentin (NEURONTIN) 100 MG capsule, , Disp: , Rfl:  .  HYDROcodone-acetaminophen (NORCO/VICODIN) 5-325 MG tablet, Take 1-2 tablets by mouth daily as needed for moderate pain., Disp: 45 tablet, Rfl: 0 .  lisinopril (PRINIVIL,ZESTRIL) 10 MG tablet, Take 1 tablet (10 mg total) by mouth daily., Disp: 90 tablet, Rfl: 3 .  loratadine (CLARITIN) 10 MG tablet, TAKE 1 TABLET (10 MG TOTAL) BY MOUTH DAILY AS NEEDED FOR ALLERGIES., Disp: 90 tablet, Rfl: 3 .  loratadine (CLARITIN) 10 MG tablet, TAKE 1 TABLET (10 MG TOTAL) BY MOUTH DAILY AS NEEDED FOR ALLERGIES., Disp:  90 tablet, Rfl: 1 .  metFORMIN (GLUCOPHAGE) 500 MG tablet, TAKE 1 TABLET (500 MG TOTAL) BY MOUTH DAILY WITH BREAKFAST., Disp: 90 tablet, Rfl: 1 .  omeprazole (PRILOSEC) 20 MG capsule, TAKE 1 CAPSULE (20 MG TOTAL) BY MOUTH DAILY., Disp: 90 capsule, Rfl: 1 .  simvastatin (ZOCOR) 20 MG tablet, TAKE 1 TABLET (20 MG TOTAL) BY MOUTH DAILY., Disp: 90 tablet, Rfl: 1  Current Facility-Administered Medications:  .  cadexomer iodine (IODOSORB) 0.9 % gel, , Topical, Once per day on Mon Wed Fri, Jessy Oto, MD Allergies  Allergen Reactions  . Penicillins Other (See Comments)    Has patient had a PCN reaction causing immediate  rash, facial/tongue/throat swelling, SOB or lightheadedness with hypotension: unknown Has patient had a PCN reaction causing severe rash involving mucus membranes or skin necrosis:unknown Has patient had a PCN reaction that required hospitalization unknown Has patient had a PCN reaction occurring within the last 10 years: unknown If all of the above answers are "NO", then may proceed with Cephalosporin use.     Social History  Substance Use Topics  . Smoking status: Former Smoker    Packs/day: 0.25    Years: 30.00    Types: Cigarettes  . Smokeless tobacco: Former Systems developer    Quit date: 06/13/2004     Comment: Quit when she went to nursing home/ states she light smoker; Only smoked periodically; Pack years undetermined  . Alcohol use 0.6 oz/week    1 Standard drinks or equivalent per week     Comment: Occasinaly beer with dinner    Family History  Problem Relation Age of Onset  . Cancer Brother   . Cancer Mother       Review of Systems  Constitutional: negative for fatigue and weight loss Respiratory: negative for cough and wheezing Cardiovascular: negative for chest pain, fatigue and palpitations Gastrointestinal: negative for abdominal pain and change in bowel habits Musculoskeletal:negative for myalgias Neurological: negative for gait problems and tremors Behavioral/Psych: negative for abusive relationship, depression Endocrine: negative for temperature intolerance    Genitourinary:negative for abnormal menstrual periods, genital lesions, hot flashes, sexual problems and vaginal discharge Integument/breast: negative for breast lump, breast tenderness, nipple discharge and skin lesion(s)    Objective:       BP 127/87   Pulse (!) 110   Wt 194 lb (88 kg)   BMI 33.30 kg/m  General:   alert  Skin:   no rash or abnormalities  Lungs:   clear to auscultation bilaterally  Heart:   regular rate and rhythm, S1, S2 normal, no murmur, click, rub or gallop  Breasts:   normal  without suspicious masses, skin or nipple changes or axillary nodes  Abdomen:  normal findings: no organomegaly, soft, non-tender and no hernia  Pelvis:  External genitalia: normal general appearance Urinary system: urethral meatus normal and bladder without fullness, nontender Vaginal: normal without tenderness, induration or masses Cervix: normal appearance Adnexa: normal bimanual exam Uterus: anteverted and non-tender, normal size   Lab Review Urine pregnancy test Labs reviewed yes Radiologic studies reviewed yes  50% of 20 min visit spent on counseling and coordination of care.    Assessment:    Healthy female exam.    Plan:    Education reviewed: calcium supplements, depression evaluation, low fat, low cholesterol diet, safe sex/STD prevention, self breast exams and weight bearing exercise. Follow up in: 1 year.   No orders of the defined types were placed in this encounter.  No orders of the  defined types were placed in this encounter.    Patient ID: [name], female   DOB: 03/02/1941, 76 y.o.   MRN: [id number]

## 2017-03-15 ENCOUNTER — Other Ambulatory Visit: Payer: Self-pay | Admitting: Obstetrics

## 2017-03-15 ENCOUNTER — Encounter: Payer: Self-pay | Admitting: *Deleted

## 2017-03-15 DIAGNOSIS — B373 Candidiasis of vulva and vagina: Secondary | ICD-10-CM

## 2017-03-15 DIAGNOSIS — B3731 Acute candidiasis of vulva and vagina: Secondary | ICD-10-CM

## 2017-03-15 LAB — CERVICOVAGINAL ANCILLARY ONLY
BACTERIAL VAGINITIS: NEGATIVE
CHLAMYDIA, DNA PROBE: NEGATIVE
Candida vaginitis: POSITIVE — AB
Neisseria Gonorrhea: NEGATIVE
Trichomonas: NEGATIVE

## 2017-03-15 MED ORDER — FLUCONAZOLE 150 MG PO TABS: 150.0000 mg | ORAL_TABLET | Freq: Once | ORAL | 0 refills | Status: AC

## 2017-03-16 ENCOUNTER — Encounter (INDEPENDENT_AMBULATORY_CARE_PROVIDER_SITE_OTHER): Payer: Self-pay | Admitting: Specialist

## 2017-03-16 ENCOUNTER — Ambulatory Visit (INDEPENDENT_AMBULATORY_CARE_PROVIDER_SITE_OTHER): Payer: Medicare Other | Admitting: Specialist

## 2017-03-16 VITALS — BP 124/78 | HR 81 | Ht 64.0 in | Wt 199.0 lb

## 2017-03-16 DIAGNOSIS — Z96659 Presence of unspecified artificial knee joint: Secondary | ICD-10-CM | POA: Diagnosis not present

## 2017-03-16 DIAGNOSIS — I872 Venous insufficiency (chronic) (peripheral): Secondary | ICD-10-CM

## 2017-03-16 DIAGNOSIS — M160 Bilateral primary osteoarthritis of hip: Secondary | ICD-10-CM | POA: Diagnosis not present

## 2017-03-16 DIAGNOSIS — M47816 Spondylosis without myelopathy or radiculopathy, lumbar region: Secondary | ICD-10-CM | POA: Diagnosis not present

## 2017-03-16 DIAGNOSIS — G8929 Other chronic pain: Secondary | ICD-10-CM

## 2017-03-16 DIAGNOSIS — G894 Chronic pain syndrome: Secondary | ICD-10-CM

## 2017-03-16 DIAGNOSIS — T8484XS Pain due to internal orthopedic prosthetic devices, implants and grafts, sequela: Secondary | ICD-10-CM

## 2017-03-16 DIAGNOSIS — M5441 Lumbago with sciatica, right side: Secondary | ICD-10-CM

## 2017-03-16 DIAGNOSIS — R102 Pelvic and perineal pain: Secondary | ICD-10-CM

## 2017-03-16 DIAGNOSIS — M62542 Muscle wasting and atrophy, not elsewhere classified, left hand: Secondary | ICD-10-CM | POA: Diagnosis not present

## 2017-03-16 DIAGNOSIS — M47814 Spondylosis without myelopathy or radiculopathy, thoracic region: Secondary | ICD-10-CM

## 2017-03-16 DIAGNOSIS — R29898 Other symptoms and signs involving the musculoskeletal system: Secondary | ICD-10-CM

## 2017-03-16 LAB — CYTOLOGY - PAP
DIAGNOSIS: NEGATIVE
HPV (WINDOPATH): NOT DETECTED

## 2017-03-16 MED ORDER — HYDROCODONE-ACETAMINOPHEN 5-325 MG PO TABS: 1.0000 | ORAL_TABLET | Freq: Every day | ORAL | 0 refills | Status: DC | PRN

## 2017-03-16 NOTE — Patient Instructions (Addendum)
Avoid bending, stooping and avoid lifting weights greater than 10 lbs. Avoid prolong standing and walking. Avoid frequent bending and stooping  No lifting greater than 10 lbs. May use ice or moist heat for pain. Weight loss is of benefit. Handicap license is approved. Referral to pain management for medication treatment of chronic pain on tricyclic Precluding tramadol, on eloquis precluding use of NSAIDs failed physical therapy. Advised to use arthritis strength tylenol up to 3 times a day but no more to avoid  Tylenol toxicity.

## 2017-03-16 NOTE — Progress Notes (Signed)
Office Visit Note   Patient: [name]           Date of Birth: 09-Jul-1941           MRN: [id number] Visit Date: 03/16/2017              Requested by: Hoyt Koch, MD [place], [place] [address] PCP: Hoyt Koch, MD   Assessment & Plan: Visit Diagnoses:  1. Spondylosis without myelopathy or radiculopathy, lumbar region   2. Spondylosis of thoracic region without myelopathy or radiculopathy   3. Painful total knee replacement, sequela   4. Primary osteoarthritis of both hips   5. Pelvic pain in female   6. Venous stasis dermatitis without varicosities   7. Right leg weakness   8. Chronic right-sided low back pain with right-sided sciatica   9. Muscle wasting and atrophy, not elsewhere classified, left hand   10. Chronic pain syndrome     Plan:Avoid bending, stooping and avoid lifting weights greater than 10 lbs. Avoid prolong standing and walking. Avoid frequent bending and stooping  No lifting greater than 10 lbs. May use ice or moist heat for pain. Weight loss is of benefit. Handicap license is approved. Referral to pain management for medication treatment of chronic pain. Follow-Up Instructions: Return in about 3 months (around 06/16/2017).  Referral to pain management for medication treatment of chronic pain on tricyclic Precluding tramadol, on eloquis precluding use of NSAIDs failed physical therapy.  Advised to use arthritis strength tylenol up to 3 times a day but no more to avoid  Tylenol toxicity.   Orders:  Orders Placed This Encounter  Procedures  . Ambulatory referral to Physical Medicine Rehab   Meds ordered this encounter  Medications  . HYDROcodone-acetaminophen (NORCO/VICODIN) 5-325 MG tablet    Sig: Take 1-2 tablets by mouth daily as needed for moderate pain.    Dispense:  45 tablet    Refill:  0    This is a 30 day supply.      Procedures: No procedures performed   Clinical Data: No additional  findings.   Subjective: Chief Complaint  Patient presents with  . Lower Back - Follow-up    Ms. [name] is here to follow up on low back pain.  She states that it has been bothering her a lot more lately and it is hurting her today.  She is still keeping her 15 month old [place] and he wants her to hold him all the time.  As far as her right leg, it is doing much better she states that the swelling has gone down and she states that she been using epsom salt and alcohol on it. Complains of pain in the back and into the buttocks, also ongoing complaints of right knee pain. Has difficulty with standing and walking with pain. Last seen with spondylosis changes throughout the lumbar spine. Solid fusion interbody L4-5. History of previous  Bilateral TKR and left shoulder arthroplasty by Dr. Veverly Fells at [facility]. Lumbar pain she rates as an 8-9 on scale of 1-10, it is predominantly back without leg radiation. Presently on eloquis, previously on warfarin. Taking cymbalta and neurontin so that use of tramadol would increase risk of a serotonin syndrome. Pains worsened with physical therapy at [facility], [facility].     Review of Systems  Constitutional: Negative.   HENT: Negative.   Eyes: Negative.   Respiratory: Negative.   Cardiovascular: Negative.   Gastrointestinal: Negative.  Endocrine: Negative.   Genitourinary: Negative.   Musculoskeletal: Negative.   Skin: Negative.   Allergic/Immunologic: Negative.   Neurological: Negative.   Hematological: Negative.   Psychiatric/Behavioral: Negative.      Objective: Vital Signs: BP 124/78 (BP Location: Left Arm, Patient Position: Sitting)   Pulse 81   Ht 5\' 4"  (1.626 m)   Wt 199 lb (90.3 kg)   BMI 34.16 kg/m   Physical Exam  Constitutional: She is oriented to person, place, and time. She appears well-developed and well-nourished.  HENT:  Head: Normocephalic and atraumatic.  Eyes: EOM are normal. Pupils are equal, round, and reactive to  light.  Neck: Normal range of motion. Neck supple. No tracheal deviation present.  Cardiovascular: Normal rate and regular rhythm.   Pulmonary/Chest: Effort normal and breath sounds normal. No respiratory distress. She has no wheezes. She exhibits no tenderness.  Abdominal: Soft. Bowel sounds are normal. There is no tenderness. There is no guarding.  Lymphadenopathy:    She has no cervical adenopathy.  Neurological: She is alert and oriented to person, place, and time.  Skin: Skin is warm and dry.  Psychiatric: She has a normal mood and affect. Her behavior is normal. Judgment and thought content normal.    Back Exam   Tenderness  The patient is experiencing tenderness in the lumbar.  Range of Motion  Extension:  10 abnormal  Flexion:  60 abnormal  Lateral Bend Right: abnormal  Lateral Bend Left: abnormal  Rotation Right: abnormal  Rotation Left: abnormal   Muscle Strength  Right Quadriceps:  5/5  Left Quadriceps:  5/5  Right Hamstrings:  5/5  Left Hamstrings:  5/5   Tests  Straight leg raise right: negative Straight leg raise left: negative  Reflexes  Patellar: normal Achilles:  1/4 normal Babinski's sign: normal   Other  Toe Walk: normal Heel Walk: normal Gait: normal  Erythema: no back redness Scars: absent      Specialty Comments:  No specialty comments available.  Imaging: No results found.   PMFS History: Patient Active Problem List   Diagnosis Date Noted  . Chronic pain syndrome 08/04/2015  . Morbid obesity ([place]) 04/07/2015  . Osteoarthritis 04/07/2015  . Polypharmacy 02/13/2015  . Atrial fibrillation ([place]) 11/06/2014  . Elevated d-dimer 11/06/2014  . Diabetes mellitus type 2, controlled, without complications ([place]) 32/35/5732  . CAD (coronary artery disease), native coronary artery 11/06/2014  . Essential hypertension 11/06/2014  . Asthma in adult without complication 20/25/4270  . Arthritis 04/07/2014   Past Medical History:    Diagnosis Date  . Acute MI   . Arthritis   . Asthma   . Atrial fibrillation ([place])   . Diabetes mellitus   . DVT (deep venous thrombosis) ([place])   . Gout   . Gout   . Stroke [facility])     Family History  Problem Relation Age of Onset  . Cancer Brother   . Cancer Mother     Past Surgical History:  Procedure Laterality Date  . BACK SURGERY Left 2003   left knee replacement and left shoulder  . FOOT SURGERY    . JOINT REPLACEMENT    . SHOULDER SURGERY     Social History   Occupational History  . Not on file.   Social History Main Topics  . Smoking status: Former Smoker    Packs/day: 0.25    Years: 30.00    Types: Cigarettes  . Smokeless tobacco: Former Systems developer    Quit date: 06/13/2004  Comment: Quit when she went to nursing home/ states she light smoker; Only smoked periodically; Pack years undetermined  . Alcohol use 0.6 oz/week    1 Standard drinks or equivalent per week     Comment: Occasinaly beer with dinner  . Drug use: Yes    Types: Marijuana     Comment: occasionally  . Sexual activity: Not Currently

## 2017-03-17 ENCOUNTER — Ambulatory Visit
Admission: RE | Admit: 2017-03-17 | Discharge: 2017-03-17 | Disposition: A | Discharge status: Home / Self Care | Payer: Medicare Other | Source: Ambulatory Visit | Attending: Obstetrics | Admitting: Obstetrics

## 2017-03-17 DIAGNOSIS — Z1231 Encounter for screening mammogram for malignant neoplasm of breast: Secondary | ICD-10-CM | POA: Diagnosis not present

## 2017-03-21 ENCOUNTER — Telehealth: Payer: Self-pay | Admitting: Internal Medicine

## 2017-03-27 ENCOUNTER — Telehealth: Payer: Self-pay | Admitting: *Deleted

## 2017-03-27 NOTE — Telephone Encounter (Signed)
error 

## 2017-03-27 NOTE — Telephone Encounter (Signed)
Pt called in and wanted test results explained to her that were sent in the mail, stated she was very concerned and would like to have a call back. Marland Kitchen..

## 2017-03-28 NOTE — Telephone Encounter (Signed)
LM on VM to call office for results.

## 2017-03-29 ENCOUNTER — Telehealth: Payer: Self-pay | Admitting: Internal Medicine

## 2017-03-29 NOTE — Telephone Encounter (Signed)
Sent faxes over last week for medication request.  Patient is switching pharmacy.  Does need scripts for all medications sent over.  Fax number [phone].

## 2017-03-29 NOTE — Telephone Encounter (Signed)
Switched pharmacy to pill pack

## 2017-03-30 NOTE — Telephone Encounter (Signed)
Attempt to contact pt. No answer, LM on VM to call back.  Call to be closed.

## 2017-04-03 ENCOUNTER — Ambulatory Visit (INDEPENDENT_AMBULATORY_CARE_PROVIDER_SITE_OTHER): Payer: Self-pay | Admitting: Neurology

## 2017-04-03 ENCOUNTER — Encounter: Payer: Self-pay | Admitting: Neurology

## 2017-04-03 ENCOUNTER — Ambulatory Visit (INDEPENDENT_AMBULATORY_CARE_PROVIDER_SITE_OTHER): Payer: Medicare Other | Admitting: Neurology

## 2017-04-03 DIAGNOSIS — M79601 Pain in right arm: Secondary | ICD-10-CM | POA: Diagnosis not present

## 2017-04-03 DIAGNOSIS — G894 Chronic pain syndrome: Secondary | ICD-10-CM

## 2017-04-03 NOTE — Progress Notes (Signed)
Please refer to EMG and nerve conduction study procedure note. 

## 2017-04-03 NOTE — Procedures (Signed)
     HISTORY:  [name] is a 76 year old patient with a history of intermittent shock sensations down the right arm and right leg since 2005. The patient indicates that if she rolls on her right side at night, she can induce the discomfort down the right arm and right leg. The episodes last 3-5 minutes and then dissipate. She is being evaluated for a possible neuropathy or a cervical radiculopathy.  NERVE CONDUCTION STUDIES:  Nerve conduction studies were performed on both upper extremities. The distal motor latencies and motor amplitudes for the median and ulnar nerves were within normal limits. The F wave latencies and nerve conduction velocities for these nerves were also normal. The sensory latencies for the median and ulnar nerves were normal.   EMG STUDIES:  EMG study was performed on the right upper extremity:  The first dorsal interosseous muscle reveals 2 to 4 K units with full recruitment. No fibrillations or positive waves were noted. The abductor pollicis brevis muscle reveals 2 to 4 K units with full recruitment. No fibrillations or positive waves were noted. The extensor indicis proprius muscle reveals 1 to 3 K units with full recruitment. No fibrillations or positive waves were noted. The pronator teres muscle reveals 2 to 3 K units with full recruitment. No fibrillations or positive waves were noted. The biceps muscle reveals 1 to 2 K units with full recruitment. No fibrillations or positive waves were noted. The triceps muscle reveals 2 to 4 K units with full recruitment. No fibrillations or positive waves were noted. The anterior deltoid muscle reveals 2 to 3 K units with full recruitment. No fibrillations or positive waves were noted. The cervical paraspinal muscles were tested at 2 levels. No abnormalities of insertional activity were seen at either level tested. There was fair relaxation.   IMPRESSION:  Nerve conduction studies done on both upper  extremities were within normal limits. No evidence of a neuropathy is seen. EMG evaluation of the right upper extremity is unremarkable without evidence of an overlying cervical radiculopathy.  Jill Alexanders MD 04/03/2017 9:48 AM  [facility] [address] [place], [place] [address]  Phone [phone] Fax [phone]

## 2017-04-19 ENCOUNTER — Ambulatory Visit (INDEPENDENT_AMBULATORY_CARE_PROVIDER_SITE_OTHER): Payer: Medicare Other | Admitting: Internal Medicine

## 2017-04-19 ENCOUNTER — Encounter: Payer: Self-pay | Admitting: Internal Medicine

## 2017-04-19 VITALS — BP 142/80 | HR 50 | Temp 98.1°F | Resp 12 | Ht 64.0 in | Wt 189.0 lb

## 2017-04-19 DIAGNOSIS — G894 Chronic pain syndrome: Secondary | ICD-10-CM | POA: Diagnosis not present

## 2017-04-19 DIAGNOSIS — E119 Type 2 diabetes mellitus without complications: Secondary | ICD-10-CM

## 2017-04-19 DIAGNOSIS — I10 Essential (primary) hypertension: Secondary | ICD-10-CM | POA: Diagnosis not present
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
## 2017-04-19 NOTE — Assessment & Plan Note (Signed)
We did explain to her again today that given her multiple violations in the past we cannot give her any hydrocodone now or in the future as we have discussed before. Review of [place] controlled substance registry reveals that she is regularly getting hydrocodone from Dr. Louanne Skye and she would need to contact their office for refills. She was offered referral to pain clinic if she is not able to get from Dr. Louanne Skye and she refuses this. She is not able to get controlled substances from our office at this time or in the future and this was explicitly stated after our visit when she refused to leave our office without a prescription.

## 2017-04-19 NOTE — Progress Notes (Signed)
Pre visit review using our clinic review tool, if applicable. No additional management support is needed unless otherwise documented below in the visit note. 

## 2017-04-19 NOTE — Patient Instructions (Signed)
The duloxetine is the medicine for the nerves that you should be taking every day.   We have given you a parking form as well as the scat form.

## 2017-04-19 NOTE — Assessment & Plan Note (Signed)
BP slightly above goal and she is not sure she is taking all of her BP medications. Reviewed these medications with her today and she will resume lasix, atenolol and lisinopril which previously had been controlled her BP. Recent CMP without indication for change.

## 2017-04-19 NOTE — Assessment & Plan Note (Signed)
Checking HgA1c, taking metformin and on ACE-I and statin although she admits to poor compliance with meds recently. Adjust as needed.

## 2017-04-28 NOTE — Progress Notes (Deleted)
Pre visit review using our clinic review tool, if applicable. No additional management support is needed unless otherwise documented below in the visit note. 

## 2017-04-28 NOTE — Progress Notes (Deleted)
Subjective:   [name] is a 76 y.o. female who presents for Medicare Annual (Subsequent) preventive examination.  Review of Systems:  No ROS.  Medicare Wellness Visit.   Sleep patterns: {SX; SLEEP PATTERNS:18802::"feels rested on waking","does not get up to void","gets up *** times nightly to void","sleeps *** hours nightly"}.   Home Safety/Smoke Alarms:   Living environment; residence and Firearm Safety: {Rehab home environment / accessibility:30080::"no firearms","firearms stored safely"}. Seat Belt Safety/Bike Helmet: Wears seat belt.   Counseling:   Eye Exam-  Dental-  Female:   Pap-   N/A    Mammo- Last 03/17/17, BI-RADS CATEGORY  1: Negative      Dexa scan- Last 04/17/14, no results found       CCS- Last 02/11/11, recall 2 years     Objective:     Vitals: There were no vitals taken for this visit.  There is no height or weight on file to calculate BMI.   Tobacco History  Smoking Status  . Former Smoker  . Packs/day: 0.25  . Years: 30.00  . Types: Cigarettes  Smokeless Tobacco  . Former Systems developer  . Quit date: 06/13/2004    Comment: Quit when she went to nursing home/ states she light smoker; Only smoked periodically; Pack years undetermined     Counseling given: Not Answered   Past Medical History:  Diagnosis Date  . Acute MI ([place])   . Arthritis   . Asthma   . Atrial fibrillation ([place])   . Diabetes mellitus   . DVT (deep venous thrombosis) ([place])   . Gout   . Gout   . Stroke [facility])    Past Surgical History:  Procedure Laterality Date  . BACK SURGERY Left 2003   left knee replacement and left shoulder  . BREAST EXCISIONAL BIOPSY Left 1983  . FOOT SURGERY    . JOINT REPLACEMENT    . SHOULDER SURGERY     Family History  Problem Relation Age of Onset  . Cancer Brother   . Cancer Mother   . Breast cancer Mother 72   History  Sexual Activity  . Sexual activity: Not Currently    Outpatient Encounter Prescriptions as of 05/01/2017    Medication Sig  . albuterol (VENTOLIN HFA) 108 (90 BASE) MCG/ACT inhaler Inhale 2 puffs into the lungs every 6 (six) hours as needed for wheezing or shortness of breath (SOB).  Marland Kitchen allopurinol (ZYLOPRIM) 100 MG tablet TAKE 1 TABLET (100 MG TOTAL) BY MOUTH DAILY.  Marland Kitchen atenolol (TENORMIN) 50 MG tablet Take 1 tablet (50 mg total) by mouth daily.  . cadexomer iodine (IODOSORB) 0.9 % gel Apply 1 application topically daily as needed for wound care.  . clotrimazole (LOTRIMIN) 1 % cream Apply 1 application topically 2 (two) times daily.  . DULoxetine (CYMBALTA) 60 MG capsule TAKE 1 CAPSULE (60 MG TOTAL) BY MOUTH DAILY.  Marland Kitchen ELIQUIS 5 MG TABS tablet TAKE 1 TABLET (5 MG TOTAL) BY MOUTH TWO   (TWO) TIMES DAILY.  Marland Kitchen Fluticasone-Salmeterol (ADVAIR) 100-50 MCG/DOSE AEPB Inhale 1 puff into the lungs 2 (two) times daily.  . furosemide (LASIX) 40 MG tablet TAKE 1 TABLET (40 MG TOTAL) BY MOUTH DAILY AS NEEDED (LEG SWELLING).  Marland Kitchen gabapentin (NEURONTIN) 100 MG capsule   . HYDROcodone-acetaminophen (NORCO/VICODIN) 5-325 MG tablet Take 1-2 tablets by mouth daily as needed for moderate pain.  Marland Kitchen lisinopril (PRINIVIL,ZESTRIL) 10 MG tablet Take 1 tablet (10 mg total) by mouth daily.  Marland Kitchen loratadine (CLARITIN) 10 MG  tablet TAKE 1 TABLET (10 MG TOTAL) BY MOUTH DAILY AS NEEDED FOR ALLERGIES.  Marland Kitchen metFORMIN (GLUCOPHAGE) 500 MG tablet TAKE 1 TABLET (500 MG TOTAL) BY MOUTH DAILY WITH BREAKFAST.  Marland Kitchen omeprazole (PRILOSEC) 20 MG capsule TAKE 1 CAPSULE (20 MG TOTAL) BY MOUTH DAILY.  . simvastatin (ZOCOR) 20 MG tablet TAKE 1 TABLET (20 MG TOTAL) BY MOUTH DAILY.   No facility-administered encounter medications on file as of 05/01/2017.     Activities of Daily Living In your present state of health, do you have any difficulty performing the following activities: 04/28/2016  Hearing? N  Vision? N  Difficulty concentrating or making decisions? N  Walking or climbing stairs? Y  Dressing or bathing? N  Doing errands, shopping? (No Data)   Preparing Food and eating ? N  Using the Toilet? N  Managing your Medications? N  Managing your Finances? N  Housekeeping or managing your Housekeeping? N  Some recent data might be hidden    Patient Care Team: Hoyt Koch, MD as PCP - General (Internal Medicine) Shelly Bombard, MD as Consulting Physician (Obstetrics and Gynecology) Jessy Oto, MD as Consulting Physician (Orthopedic Surgery)    Assessment:    Physical assessment deferred to PCP.  Exercise Activities and Dietary recommendations   Diet (meal preparation, eat out, water intake, caffeinated beverages, dairy products, fruits and vegetables): {Desc; diets:16563} Breakfast: Lunch:  Dinner:      Goals      Patient Stated   . patient  (pt-stated)          Will learn her medicines and go to the doctor when you have "red flags" Dizziness, sob, falls, swollen leg; other issues that come up suddenly;       Other   . Exercise 3x per week (30 min per time)          Wants to remain independent; Cleans house to stay mobile; likes to cook;       Fall Risk Fall Risk  04/28/2016 04/13/2016 04/07/2015  Falls in the past year? No No No  Risk for fall due to : - - Impaired mobility  Risk for fall due to (comments): - - uses cane on right/hurts on right   Depression Screen PHQ 2/9 Scores 04/28/2016 04/13/2016 04/07/2015  PHQ - 2 Score 0 0 0     Cognitive Function MMSE - Mini Mental State Exam 04/28/2016  Not completed: (No Data)        There is no immunization history for the selected administration types on file for this patient. Screening Tests Health Maintenance  Topic Date Due  . OPHTHALMOLOGY EXAM  05/11/1952  . TETANUS/TDAP  05/11/1961  . COLONOSCOPY  05/11/1992  . PNA vac Low Risk Adult (1 of 2 - PCV13) 05/12/2007  . FOOT EXAM  01/07/2017  . HEMOGLOBIN A1C  04/10/2017  . INFLUENZA VACCINE  10/10/2017 (Originally 07/26/2017)  . MAMMOGRAM  03/18/2019  . DEXA SCAN  Completed      Plan:       I have personally reviewed and noted the following in the patient's chart:   . Medical and social history . Use of alcohol, tobacco or illicit drugs  . Current medications and supplements . Functional ability and status . Nutritional status . Physical activity . Advanced directives . List of other physicians . Hospitalizations, surgeries, and ER visits in previous 12 months . Vitals . Screenings to include cognitive, depression, and falls . Referrals and appointments  In addition, I  have reviewed and discussed with patient certain preventive protocols, quality metrics, and best practice recommendations. A written personalized care plan for preventive services as well as general preventive health recommendations were provided to patient.     Michiel Cowboy, RN  04/28/2017

## 2017-05-01 ENCOUNTER — Ambulatory Visit: Payer: Medicare Other

## 2017-05-24 ENCOUNTER — Other Ambulatory Visit: Payer: Self-pay | Admitting: Internal Medicine

## 2017-06-02 ENCOUNTER — Telehealth: Payer: Self-pay | Admitting: *Deleted

## 2017-06-02 DIAGNOSIS — M62542 Muscle wasting and atrophy, not elsewhere classified, left hand: Secondary | ICD-10-CM

## 2017-06-02 DIAGNOSIS — M47814 Spondylosis without myelopathy or radiculopathy, thoracic region: Secondary | ICD-10-CM

## 2017-06-02 DIAGNOSIS — R29898 Other symptoms and signs involving the musculoskeletal system: Secondary | ICD-10-CM

## 2017-06-02 DIAGNOSIS — G8929 Other chronic pain: Secondary | ICD-10-CM

## 2017-06-02 DIAGNOSIS — Z96659 Presence of unspecified artificial knee joint: Secondary | ICD-10-CM

## 2017-06-02 DIAGNOSIS — M47816 Spondylosis without myelopathy or radiculopathy, lumbar region: Secondary | ICD-10-CM

## 2017-06-02 DIAGNOSIS — M160 Bilateral primary osteoarthritis of hip: Secondary | ICD-10-CM

## 2017-06-02 DIAGNOSIS — I872 Venous insufficiency (chronic) (peripheral): Secondary | ICD-10-CM

## 2017-06-02 DIAGNOSIS — T8484XS Pain due to internal orthopedic prosthetic devices, implants and grafts, sequela: Secondary | ICD-10-CM

## 2017-06-02 DIAGNOSIS — R102 Pelvic and perineal pain: Secondary | ICD-10-CM

## 2017-06-02 DIAGNOSIS — M5441 Lumbago with sciatica, right side: Secondary | ICD-10-CM

## 2017-06-02 MED ORDER — HYDROCODONE-ACETAMINOPHEN 5-325 MG PO TABS: 1.0000 | ORAL_TABLET | Freq: Every day | ORAL | 0 refills | Status: DC | PRN

## 2017-06-02 NOTE — Telephone Encounter (Signed)
Notified pt rx ready for pick-up.../lmb 

## 2017-06-02 NOTE — Telephone Encounter (Signed)
[place] controlled substance database reviewed. Medication refill available for pickup.

## 2017-06-02 NOTE — Telephone Encounter (Signed)
Rec'd call pt requesting refill on Hydrocodone..MD is out office pls advise...[name]

## 2017-06-13 ENCOUNTER — Ambulatory Visit (INDEPENDENT_AMBULATORY_CARE_PROVIDER_SITE_OTHER): Payer: Medicare Other | Admitting: Obstetrics

## 2017-06-13 ENCOUNTER — Encounter: Payer: Self-pay | Admitting: Obstetrics

## 2017-06-13 VITALS — BP 139/87 | HR 70 | Wt 170.0 lb

## 2017-06-13 DIAGNOSIS — R399 Unspecified symptoms and signs involving the genitourinary system: Secondary | ICD-10-CM

## 2017-06-13 DIAGNOSIS — R1030 Lower abdominal pain, unspecified: Secondary | ICD-10-CM

## 2017-06-13 LAB — POCT URINALYSIS DIPSTICK
BILIRUBIN UA: NEGATIVE
GLUCOSE UA: NEGATIVE
Ketones, UA: NEGATIVE
LEUKOCYTES UA: NEGATIVE
Nitrite, UA: NEGATIVE
Spec Grav, UA: 1.01 (ref 1.010–1.025)
Urobilinogen, UA: 0.2 E.U./dL
pH, UA: 6 (ref 5.0–8.0)

## 2017-06-13 MED ORDER — NITROFURANTOIN MONOHYD MACRO 100 MG PO CAPS: 100.0000 mg | ORAL_CAPSULE | Freq: Two times a day (BID) | ORAL | 0 refills | Status: DC

## 2017-06-13 NOTE — Progress Notes (Signed)
Patient ID: [name], female   DOB: Sep 07, 1941, 76 y.o.   MRN: [id number]  Chief Complaint  Patient presents with  . Abdominal Pain    HPI [name] is a 76 y.o. female.  Intermittent lower abdominal pain for past 3 months.  Also has pain with urination.  Denies diarrhea / constipation, N / V or heartburn. HPI  Past Medical History:  Diagnosis Date  . Acute MI ([place])   . Arthritis   . Asthma   . Atrial fibrillation ([place])   . Diabetes mellitus   . DVT (deep venous thrombosis) ([place])   . Gout   . Gout   . Stroke [facility])     Past Surgical History:  Procedure Laterality Date  . BACK SURGERY Left 2003   left knee replacement and left shoulder  . BREAST EXCISIONAL BIOPSY Left 1983  . FOOT SURGERY    . JOINT REPLACEMENT    . SHOULDER SURGERY      Family History  Problem Relation Age of Onset  . Cancer Brother   . Cancer Mother   . Breast cancer Mother 51    Social History Social History  Substance Use Topics  . Smoking status: Former Smoker    Packs/day: 0.25    Years: 30.00    Types: Cigarettes  . Smokeless tobacco: Former Systems developer    Quit date: 06/13/2004     Comment: Quit when she went to nursing home/ states she light smoker; Only smoked periodically; Pack years undetermined  . Alcohol use 0.6 oz/week    1 Standard drinks or equivalent per week     Comment: Occasinaly beer with dinner    Allergies  Allergen Reactions  . Penicillins Other (See Comments)    Has patient had a PCN reaction causing immediate rash, facial/tongue/throat swelling, SOB or lightheadedness with hypotension: unknown Has patient had a PCN reaction causing severe rash involving mucus membranes or skin necrosis:unknown Has patient had a PCN reaction that required hospitalization unknown Has patient had a PCN reaction occurring within the last 10 years: unknown If all of the above answers are "NO", then may proceed with Cephalosporin use.     Current Outpatient  Prescriptions  Medication Sig Dispense Refill  . albuterol (VENTOLIN HFA) 108 (90 BASE) MCG/ACT inhaler Inhale 2 puffs into the lungs every 6 (six) hours as needed for wheezing or shortness of breath (SOB).    Marland Kitchen allopurinol (ZYLOPRIM) 100 MG tablet TAKE 1 TABLET (100 MG TOTAL) BY MOUTH DAILY. 90 tablet 1  . atenolol (TENORMIN) 50 MG tablet Take 1 tablet (50 mg total) by mouth daily. 90 tablet 3  . cadexomer iodine (IODOSORB) 0.9 % gel Apply 1 application topically daily as needed for wound care. 40 g 0  . clotrimazole (LOTRIMIN) 1 % cream Apply 1 application topically 2 (two) times daily. 60 g 1  . DULoxetine (CYMBALTA) 60 MG capsule TAKE 1 CAPSULE (60 MG TOTAL) BY MOUTH DAILY. 90 capsule 3  . ELIQUIS 5 MG TABS tablet TAKE 1 TABLET (5 MG TOTAL) BY MOUTH TWO   (TWO) TIMES DAILY. 180 tablet 1  . Fluticasone-Salmeterol (ADVAIR) 100-50 MCG/DOSE AEPB Inhale 1 puff into the lungs 2 (two) times daily.    . furosemide (LASIX) 40 MG tablet TAKE 1 TABLET (40 MG TOTAL) BY MOUTH DAILY AS NEEDED (LEG SWELLING). 90 tablet 1  . gabapentin (NEURONTIN) 100 MG capsule     . HYDROcodone-acetaminophen (NORCO/VICODIN) 5-325 MG tablet Take 1-2 tablets by mouth  daily as needed for moderate pain. 45 tablet 0  . lisinopril (PRINIVIL,ZESTRIL) 10 MG tablet Take 1 tablet (10 mg total) by mouth daily. 90 tablet 3  . loratadine (CLARITIN) 10 MG tablet TAKE 1 TABLET (10 MG TOTAL) BY MOUTH DAILY AS NEEDED FOR ALLERGIES. 90 tablet 1  . metFORMIN (GLUCOPHAGE) 500 MG tablet TAKE 1 TABLET (500 MG TOTAL) BY MOUTH DAILY WITH BREAKFAST. 90 tablet 1  . omeprazole (PRILOSEC) 20 MG capsule TAKE 1 CAPSULE (20 MG TOTAL) BY MOUTH DAILY. 90 capsule 1  . simvastatin (ZOCOR) 20 MG tablet TAKE 1 TABLET (20 MG TOTAL) BY MOUTH DAILY. 90 tablet 1  . nitrofurantoin, macrocrystal-monohydrate, (MACROBID) 100 MG capsule Take 1 capsule (100 mg total) by mouth 2 (two) times daily. 14 capsule 0   No current facility-administered medications for this  visit.     Review of Systems Review of Systems Constitutional: negative for fatigue and weight loss Respiratory: negative for cough and wheezing Cardiovascular: negative for chest pain, fatigue and palpitations Gastrointestinal: negative for abdominal pain and change in bowel habits Genitourinary:negative Integument/breast: negative for nipple discharge Musculoskeletal:negative for myalgias Neurological: negative for gait problems and tremors Behavioral/Psych: negative for abusive relationship, depression Endocrine: negative for temperature intolerance      Blood pressure 139/87, pulse 70, weight 170 lb (77.1 kg).  Physical Exam Physical Exam General:   alert  Skin:   no rash or abnormalities  Lungs:   clear to auscultation bilaterally  Heart:   regular rate and rhythm, S1, S2 normal, no murmur, click, rub or gallop  Breasts:   normal without suspicious masses, skin or nipple changes or axillary nodes  Abdomen:  normal findings: no organomegaly, soft, non-tender and no hernia  Pelvis:  External genitalia: normal general appearance Urinary system: urethral meatus normal and bladder without fullness, nontender Vaginal: normal without tenderness, induration or masses Cervix: normal appearance Adnexa: normal bimanual exam Uterus: anteverted and non-tender, normal size    50% of 15 min visit spent on counseling and coordination of care.    Data Reviewed Labs  Assessment     1. Intermittent lower abdominal pain Rx: - POCT urinalysis dipstick - CT ABDOMEN PELVIS W WO CONTRAST; Future - Urine Culture  2. UTI symptoms Rx: - nitrofurantoin, macrocrystal-monohydrate, (MACROBID) 100 MG capsule; Take 1 capsule (100 mg total) by mouth 2 (two) times daily.  Dispense: 14 capsule; Refill: 0 - Urine Culture    Plan    Follow up in 2 weeks  Orders Placed This Encounter  Procedures  . Urine Culture  . CT ABDOMEN PELVIS W WO CONTRAST    This exam should ONLY be ordered for  initial diagnosis or follow up of known pancreatic/liver/renal/bladder masses.    Standing Status:   Future    Standing Expiration Date:   09/13/2018    Order Specific Question:   If indicated for the ordered procedure, I authorize the administration of contrast media per Radiology protocol    Answer:   Yes    Order Specific Question:   Reason for Exam (SYMPTOM  OR DIAGNOSIS REQUIRED)    Answer:   Abdominal pain    Order Specific Question:   Preferred imaging location?    Answer:   [facility]    Order Specific Question:   Radiology Contrast Protocol - do NOT remove file path    Answer:   [email or web address]  . POCT urinalysis dipstick   Meds ordered this encounter  Medications  . nitrofurantoin, macrocrystal-monohydrate, (MACROBID) 100 MG  capsule    Sig: Take 1 capsule (100 mg total) by mouth 2 (two) times daily.    Dispense:  14 capsule    Refill:  0

## 2017-06-13 NOTE — Progress Notes (Signed)
Pt c/o intermittent mid low abdominal pain radiating towards umbilical x 3 months. Normal pap 03/14/17, MGM 03/17/17

## 2017-06-14 ENCOUNTER — Ambulatory Visit (INDEPENDENT_AMBULATORY_CARE_PROVIDER_SITE_OTHER): Payer: Medicare Other | Admitting: Specialist

## 2017-06-15 LAB — URINE CULTURE

## 2017-07-24 ENCOUNTER — Other Ambulatory Visit (INDEPENDENT_AMBULATORY_CARE_PROVIDER_SITE_OTHER): Payer: Self-pay | Admitting: Specialist

## 2017-07-24 NOTE — Telephone Encounter (Signed)
Gabapentin refill request 

## 2017-08-04 ENCOUNTER — Telehealth: Payer: Self-pay | Admitting: Internal Medicine

## 2017-08-04 DIAGNOSIS — R350 Frequency of micturition: Secondary | ICD-10-CM

## 2017-08-04 DIAGNOSIS — R300 Dysuria: Secondary | ICD-10-CM

## 2017-08-04 NOTE — Telephone Encounter (Signed)
Chart reviewed and is appropriate.

## 2017-08-04 NOTE — Telephone Encounter (Signed)
Pls advise on msg below.../lmb 

## 2017-08-04 NOTE — Telephone Encounter (Signed)
Printed Dme orders and they have been faxed to [facility]...Johny Chess

## 2017-08-04 NOTE — Telephone Encounter (Signed)
Patient is requesting scripts for bed pads, wipes and pull ups to be sent to [facility].

## 2017-08-07 ENCOUNTER — Other Ambulatory Visit: Payer: Self-pay | Admitting: Obstetrics

## 2017-08-07 DIAGNOSIS — R1030 Lower abdominal pain, unspecified: Secondary | ICD-10-CM

## 2017-08-08 ENCOUNTER — Ambulatory Visit (HOSPITAL_COMMUNITY)
Admission: RE | Admit: 2017-08-08 | Discharge: 2017-08-08 | Disposition: A | Discharge status: Home / Self Care | Payer: Medicare Other | Source: Ambulatory Visit | Attending: Obstetrics | Admitting: Obstetrics

## 2017-08-08 DIAGNOSIS — I700 Atherosclerosis of aorta: Secondary | ICD-10-CM | POA: Diagnosis not present

## 2017-08-08 DIAGNOSIS — D259 Leiomyoma of uterus, unspecified: Secondary | ICD-10-CM | POA: Insufficient documentation

## 2017-08-08 DIAGNOSIS — R109 Unspecified abdominal pain: Secondary | ICD-10-CM | POA: Diagnosis not present

## 2017-08-08 DIAGNOSIS — R1030 Lower abdominal pain, unspecified: Secondary | ICD-10-CM | POA: Insufficient documentation

## 2017-08-08 LAB — CREATININE, SERUM
CREATININE: 0.69 mg/dL (ref 0.44–1.00)
GFR calc Af Amer: >60 mL/min (ref >60)

## 2017-08-08 LAB — BUN: BUN: 20 mg/dL (ref 6–20)

## 2017-08-08 MED ORDER — IOPAMIDOL (ISOVUE-300) INJECTION 61%
100.0000 mL | Freq: Once | INTRAVENOUS | Status: AC | PRN
  Administered 2017-08-08: 100 mL via INTRAVENOUS

## 2017-08-08 NOTE — Telephone Encounter (Signed)
DME orders was faxed on Friday 8/10. Will send [facility] w/[facility] msg to check status on orders...Johny Chess

## 2017-08-08 NOTE — Telephone Encounter (Signed)
Pt called stating that she checked with [facility] and they have not received this yet.

## 2017-08-09 ENCOUNTER — Telehealth: Payer: Self-pay | Admitting: *Deleted

## 2017-08-09 NOTE — Telephone Encounter (Signed)
-----   Message from [place] sent at 08/08/2017  5:07 PM EDT ----- Regarding: RE: DME orders Here is the notes in our system for the pt.  This is from 08/04/2017  Pt called to get incont supplies stating that she had not gotten since 05/09/2017. [facility] has not been providing since 2017. Searched [employer], pt has been getting under [employer] : Address: [address], [place], [place] [address] and Phone: [phone]. Gave pt number and then transfered to that company.    ----- Message ----- From: Earnstine Regal, MA Sent: 08/08/2017   3:30 PM To: Melissa Stenson Subject: DME orders                                     Hi Melissa,  On Friday we faxed [facility] some DME orders on this patient, and she is calling back stating [facility] states they did not receive. Can you follow-up on these orders, and let us know if anything additional need to be done!  Thanks Machias, Zeba

## 2017-08-09 NOTE — Telephone Encounter (Signed)
Noted../LMB 

## 2017-08-16 DIAGNOSIS — E119 Type 2 diabetes mellitus without complications: Secondary | ICD-10-CM | POA: Diagnosis not present

## 2017-08-16 LAB — HM DIABETES EYE EXAM

## 2017-08-23 ENCOUNTER — Other Ambulatory Visit: Payer: Self-pay | Admitting: Internal Medicine

## 2017-08-24 ENCOUNTER — Encounter: Payer: Self-pay | Admitting: Internal Medicine

## 2017-08-24 NOTE — Progress Notes (Signed)
Abstracted and sent to scan  

## 2017-08-30 ENCOUNTER — Ambulatory Visit: Payer: Medicare Other | Admitting: Internal Medicine

## 2017-09-07 ENCOUNTER — Ambulatory Visit (INDEPENDENT_AMBULATORY_CARE_PROVIDER_SITE_OTHER): Payer: Medicare Other | Admitting: Internal Medicine

## 2017-09-07 ENCOUNTER — Encounter: Payer: Self-pay | Admitting: Internal Medicine

## 2017-09-07 VITALS — BP 140/90 | HR 77 | Temp 97.7°F | Ht 64.0 in | Wt 174.0 lb

## 2017-09-07 DIAGNOSIS — G894 Chronic pain syndrome: Secondary | ICD-10-CM

## 2017-09-07 DIAGNOSIS — E119 Type 2 diabetes mellitus without complications: Secondary | ICD-10-CM

## 2017-09-07 NOTE — Patient Instructions (Signed)
We need to check the blood work today to check on the sugars and the kidneys.

## 2017-09-07 NOTE — Assessment & Plan Note (Signed)
We explained to her that we are not able to give her any hydrocodone. She is also reminded that Dr. Louanne Skye is treating her pain, she is also offered pain management referral and she does not want that.

## 2017-09-07 NOTE — Assessment & Plan Note (Addendum)
Checking HgA1c and lipid panel. Taking metformin daily and denies side effects.

## 2017-09-07 NOTE — Progress Notes (Signed)
   Subjective:    Patient ID: [name], female    DOB: 13-Jun-1941, 76 y.o.   MRN: [id number]  HPI The patient is a 76 YO female coming in for follow up of her diabetes however she only wants to discuss her pain. She had been released from our pain management due to multiple violations of our contract. She denies that she had violated her contract at all or only once but there are 2 violations in the last 1.5 years. She got some pain cream from her orthopedic and refuses to try it. I informed her that she does not have to try it if she does not want.  She denies problems with her medicines and denies low sugars. She has not changed her diet and is not exercising.   Review of Systems  Constitutional: Positive for activity change. Negative for appetite change, fatigue and unexpected weight change.  Respiratory: Negative.   Cardiovascular: Negative.   Gastrointestinal: Negative.   Musculoskeletal: Positive for arthralgias, back pain and gait problem. Negative for joint swelling, myalgias, neck pain and neck stiffness.  Skin: Negative.       Objective:   Physical Exam  Constitutional: She is oriented to person, place, and time. She appears well-developed and well-nourished.  HENT:  Head: Normocephalic and atraumatic.  Eyes: EOM are normal.  Neck: Normal range of motion.  Cardiovascular: Normal rate and regular rhythm.   Pulmonary/Chest: Effort normal.  Abdominal: Soft. She exhibits no distension. There is no tenderness. There is no rebound.  Neurological: She is alert and oriented to person, place, and time. Coordination abnormal.  Cane for ambulation.   Skin: Skin is warm and dry.   Vitals:   09/07/17 0846  BP: 140/90  Pulse: 77  Temp: 97.7 F (36.5 C)  TempSrc: Oral  SpO2: 100%  Weight: 174 lb (78.9 kg)  Height: 5\' 4"  (1.626 m)      Assessment & Plan:

## 2017-09-11 ENCOUNTER — Telehealth: Payer: Self-pay

## 2017-09-11 NOTE — Telephone Encounter (Signed)
LVM for patient to call back because I have an order form that needs to be filled in. I need to know what brand and size of incontinence supplies she uses.

## 2017-09-12 ENCOUNTER — Ambulatory Visit (INDEPENDENT_AMBULATORY_CARE_PROVIDER_SITE_OTHER): Payer: Medicare Other | Admitting: Obstetrics

## 2017-09-12 ENCOUNTER — Encounter: Payer: Self-pay | Admitting: Obstetrics

## 2017-09-12 ENCOUNTER — Other Ambulatory Visit (HOSPITAL_COMMUNITY)
Admission: RE | Admit: 2017-09-12 | Discharge: 2017-09-12 | Disposition: A | Discharge status: Home / Self Care | Payer: Medicare Other | Source: Ambulatory Visit | Attending: Obstetrics | Admitting: Obstetrics

## 2017-09-12 VITALS — BP 126/76 | HR 83 | Wt 171.0 lb

## 2017-09-12 DIAGNOSIS — R300 Dysuria: Secondary | ICD-10-CM | POA: Insufficient documentation

## 2017-09-12 DIAGNOSIS — Z113 Encounter for screening for infections with a predominantly sexual mode of transmission: Secondary | ICD-10-CM | POA: Diagnosis not present

## 2017-09-12 DIAGNOSIS — R102 Pelvic and perineal pain: Secondary | ICD-10-CM

## 2017-09-12 DIAGNOSIS — M549 Dorsalgia, unspecified: Secondary | ICD-10-CM | POA: Diagnosis not present

## 2017-09-12 LAB — POCT URINALYSIS DIPSTICK
BILIRUBIN UA: NEGATIVE
Glucose, UA: NEGATIVE
KETONES UA: NEGATIVE
LEUKOCYTES UA: NEGATIVE
Nitrite, UA: NEGATIVE
PH UA: 5 (ref 5.0–8.0)
PROTEIN UA: NEGATIVE
RBC UA: NEGATIVE
SPEC GRAV UA: 1.02 (ref 1.010–1.025)
Urobilinogen, UA: 0.2 E.U./dL

## 2017-09-12 MED ORDER — HYDROCODONE-ACETAMINOPHEN 5-325 MG PO TABS: 1.0000 | ORAL_TABLET | Freq: Four times a day (QID) | ORAL | 0 refills | Status: DC | PRN

## 2017-09-12 NOTE — Progress Notes (Signed)
Patient ID: [name], female   DOB: May 05, 1941, 76 y.o.   MRN: [id number]  Chief Complaint  Patient presents with  . Gynecologic Exam    HPI [name] is a 76 y.o. female.  Vaginal pain and burning with urination.  Low backache. HPI  Past Medical History:  Diagnosis Date  . Acute MI ([place])   . Arthritis   . Asthma   . Atrial fibrillation ([place])   . Diabetes mellitus   . DVT (deep venous thrombosis) ([place])   . Gout   . Gout   . Stroke [facility])     Past Surgical History:  Procedure Laterality Date  . BACK SURGERY Left 2003   left knee replacement and left shoulder  . BREAST EXCISIONAL BIOPSY Left 1983  . FOOT SURGERY    . JOINT REPLACEMENT    . SHOULDER SURGERY      Family History  Problem Relation Age of Onset  . Cancer Brother   . Cancer Mother   . Breast cancer Mother 53    Social History Social History  Substance Use Topics  . Smoking status: Former Smoker    Packs/day: 0.25    Years: 30.00    Types: Cigarettes  . Smokeless tobacco: Former Systems developer    Quit date: 06/13/2004     Comment: Quit when she went to nursing home/ states she light smoker; Only smoked periodically; Pack years undetermined  . Alcohol use 0.6 oz/week    1 Standard drinks or equivalent per week     Comment: Occasinaly beer with dinner    Allergies  Allergen Reactions  . Penicillins Other (See Comments)    Has patient had a PCN reaction causing immediate rash, facial/tongue/throat swelling, SOB or lightheadedness with hypotension: unknown Has patient had a PCN reaction causing severe rash involving mucus membranes or skin necrosis:unknown Has patient had a PCN reaction that required hospitalization unknown Has patient had a PCN reaction occurring within the last 10 years: unknown If all of the above answers are "NO", then may proceed with Cephalosporin use.     Current Outpatient Prescriptions  Medication Sig Dispense Refill  . albuterol (VENTOLIN HFA) 108 (90 BASE)  MCG/ACT inhaler Inhale 2 puffs into the lungs every 6 (six) hours as needed for wheezing or shortness of breath (SOB).    Marland Kitchen allopurinol (ZYLOPRIM) 100 MG tablet TAKE 1 TABLET (100 MG TOTAL) BY MOUTH DAILY. 90 tablet 1  . apixaban (ELIQUIS) 5 MG TABS tablet Take 1 tablet (5 mg total) by mouth 2 (two) times daily. Keep 9/5 appointment for further refills 180 tablet 0  . atenolol (TENORMIN) 50 MG tablet Take 1 tablet (50 mg total) by mouth daily. 90 tablet 3  . DULoxetine (CYMBALTA) 60 MG capsule TAKE 1 CAPSULE (60 MG TOTAL) BY MOUTH DAILY. 90 capsule 3  . Fluticasone-Salmeterol (ADVAIR) 100-50 MCG/DOSE AEPB Inhale 1 puff into the lungs 2 (two) times daily.    . furosemide (LASIX) 40 MG tablet Take 1 tablet (40 mg total) by mouth daily as needed (leg swelling). Keep 9/5/ appointment for further refills 90 tablet 0  . gabapentin (NEURONTIN) 100 MG capsule TAKE ONE CAPSULE BY MOUTH AT BEDTIME 30 capsule 6  . lisinopril (PRINIVIL,ZESTRIL) 10 MG tablet Take 1 tablet (10 mg total) by mouth daily. 90 tablet 3  . loratadine (CLARITIN) 10 MG tablet TAKE 1 TABLET (10 MG TOTAL) BY MOUTH DAILY AS NEEDED FOR ALLERGIES. 90 tablet 1  . metFORMIN (GLUCOPHAGE) 500 MG  tablet TAKE 1 TABLET (500 MG TOTAL) BY MOUTH DAILY WITH BREAKFAST. 90 tablet 1  . omeprazole (PRILOSEC) 20 MG capsule Take 1 capsule (20 mg total) by mouth daily. Keep 9/5/ appointment for further refills 90 capsule 0  . simvastatin (ZOCOR) 20 MG tablet Take 1 tablet (20 mg total) by mouth daily. Keep 9/5 appointment for further refills 90 tablet 0  . cadexomer iodine (IODOSORB) 0.9 % gel Apply 1 application topically daily as needed for wound care. (Patient not taking: Reported on 09/07/2017) 40 g 0  . clotrimazole (LOTRIMIN) 1 % cream Apply 1 application topically 2 (two) times daily. (Patient not taking: Reported on 09/07/2017) 60 g 1  . HYDROcodone-acetaminophen (NORCO/VICODIN) 5-325 MG tablet Take 1-2 tablets by mouth every 6 (six) hours as needed for  moderate pain. 30 tablet 0   No current facility-administered medications for this visit.     Review of Systems Review of Systems Constitutional: negative for fatigue and weight loss Respiratory: negative for cough and wheezing Cardiovascular: negative for chest pain, fatigue and palpitations Gastrointestinal: negative for abdominal pain and change in bowel habits Genitourinary:negative Integument/breast: negative for nipple discharge Musculoskeletal:negative for myalgias Neurological: negative for gait problems and tremors Behavioral/Psych: negative for abusive relationship, depression Endocrine: negative for temperature intolerance      Blood pressure 126/76, pulse 83, weight 171 lb (77.6 kg).  Physical Exam Physical Exam           General:  Alert and no distress Abdomen:  normal findings: no organomegaly, soft, non-tender and no hernia  Pelvis:  External genitalia: normal general appearance Urinary system: urethral meatus normal and bladder without fullness, nontender Vaginal: normal without tenderness, induration or masses Cervix: normal appearance Adnexa: normal bimanual exam Uterus: anteverted and non-tender, normal size    50% of 15 min visit spent on counseling and coordination of care.    Data Reviewed Urinalysis  Assessment     1. Vaginal pain Rx: - Cervicovaginal ancillary only  2. Dysuria - urine dip normal  3. Backache symptom Rx: - HYDROcodone-acetaminophen (NORCO/VICODIN) 5-325 MG tablet; Take 1-2 tablets by mouth every 6 (six) hours as needed for moderate pain.  Dispense: 30 tablet; Refill: 0    Plan    Follow up prn  Orders Placed This Encounter  Procedures  . Urine Culture   Meds ordered this encounter  Medications  . HYDROcodone-acetaminophen (NORCO/VICODIN) 5-325 MG tablet    Sig: Take 1-2 tablets by mouth every 6 (six) hours as needed for moderate pain.    Dispense:  30 tablet    Refill:  0

## 2017-09-12 NOTE — Progress Notes (Signed)
Patient is in the office for vaginal pain and pressure

## 2017-09-14 LAB — CERVICOVAGINAL ANCILLARY ONLY
Bacterial vaginitis: NEGATIVE
CANDIDA VAGINITIS: NEGATIVE
CHLAMYDIA, DNA PROBE: NEGATIVE
NEISSERIA GONORRHEA: NEGATIVE
TRICH (WINDOWPATH): NEGATIVE

## 2017-10-02 ENCOUNTER — Telehealth: Payer: Self-pay

## 2017-10-02 NOTE — Telephone Encounter (Signed)
Patient called requesting refill for her hydrocodone.

## 2017-10-04 ENCOUNTER — Other Ambulatory Visit: Payer: Self-pay | Admitting: Obstetrics

## 2017-10-04 NOTE — Telephone Encounter (Signed)
Refill denied.  She was given Rx for Hydrocodone on 09-12-17.  She should call her PCP for pain management.

## 2017-10-04 NOTE — Telephone Encounter (Signed)
Left message for pt to return call to office.

## 2017-10-05 NOTE — Telephone Encounter (Signed)
Pt informed

## 2017-10-05 NOTE — Telephone Encounter (Signed)
Left message for pt to return call to office.

## 2017-10-16 ENCOUNTER — Ambulatory Visit: Payer: Medicare Other | Admitting: Internal Medicine

## 2017-11-14 ENCOUNTER — Other Ambulatory Visit: Payer: Self-pay | Admitting: Internal Medicine

## 2017-11-15 ENCOUNTER — Other Ambulatory Visit: Payer: Self-pay | Admitting: Internal Medicine

## 2017-12-08 ENCOUNTER — Telehealth: Payer: Self-pay

## 2017-12-08 NOTE — Telephone Encounter (Signed)
Copied from [place]. Topic: General - Other >> Dec 08, 2017  1:31 PM Marin Olp L wrote: Reason for CRM: Patient calling to get details about stress test and memory test? She needs to know how to schedule it. She needs 3 days notice to travel to the appointment. Please advise.  >> Dec 08, 2017  2:21 PM [place], Isidoro Donning wrote: Do you know anything about this?

## 2017-12-08 NOTE — Telephone Encounter (Signed)
Called patient back and she was unsure of exactly what she wanted, stating that she had some piece of paper with it all written down. Will cal back when she finds paper and know exactly what she is inquiring about. I informed patient that if she was looking into a stress test or memory test she would need a visit with Dr. [name] to talk about why she needs one for a referral.

## 2018-02-26 ENCOUNTER — Other Ambulatory Visit: Payer: Self-pay

## 2018-02-26 MED ORDER — APIXABAN 5 MG PO TABS: 5.0000 mg | ORAL_TABLET | Freq: Two times a day (BID) | ORAL | 0 refills | Status: AC

## 2018-10-23 ENCOUNTER — Ambulatory Visit: Payer: Medicare HMO | Admitting: Obstetrics

## 2018-11-20 ENCOUNTER — Telehealth: Payer: Self-pay

## 2018-11-20 NOTE — Telephone Encounter (Signed)
Copied from [place] [id number]. Topic: General - Other >> Nov 20, 2018 11:52 AM Carolyn Stare wrote:  Tillie Rung with [facility] [employer] call to ask if a fax was received about pt incontinence supplies. It was fax over on 11/13/18 and will refax again today. They need the RX sign and sent back so that they can seen her her supplies    Phone number [phone]   ext [phone]

## 2018-11-20 NOTE — Telephone Encounter (Signed)
Fax was received and is in Md folder to sign

## 2019-01-11 DIAGNOSIS — R32 Unspecified urinary incontinence: Secondary | ICD-10-CM | POA: Diagnosis not present

## 2019-01-11 DIAGNOSIS — N319 Neuromuscular dysfunction of bladder, unspecified: Secondary | ICD-10-CM | POA: Diagnosis not present

## 2019-03-19 ENCOUNTER — Telehealth: Payer: Self-pay | Admitting: *Deleted

## 2019-03-19 NOTE — Telephone Encounter (Signed)
Patient called stating she is having pain in her lower abdomin, patient states she needs at least a 3 day notice if she needs to be seen. Please advise

## 2019-03-29 ENCOUNTER — Ambulatory Visit (INDEPENDENT_AMBULATORY_CARE_PROVIDER_SITE_OTHER): Payer: 59 | Admitting: Obstetrics

## 2019-03-29 ENCOUNTER — Encounter: Payer: Self-pay | Admitting: Obstetrics

## 2019-03-29 ENCOUNTER — Other Ambulatory Visit: Payer: Self-pay

## 2019-03-29 DIAGNOSIS — R1030 Lower abdominal pain, unspecified: Secondary | ICD-10-CM

## 2019-03-29 DIAGNOSIS — Z6841 Body Mass Index (BMI) 40.0 and over, adult: Secondary | ICD-10-CM | POA: Diagnosis not present

## 2019-03-29 DIAGNOSIS — R102 Pelvic and perineal pain: Secondary | ICD-10-CM

## 2019-03-29 MED ORDER — HYDROCODONE-ACETAMINOPHEN 5-325 MG PO TABS: 1.0000 | ORAL_TABLET | Freq: Four times a day (QID) | ORAL | 0 refills | Status: DC | PRN

## 2019-03-29 NOTE — Progress Notes (Signed)
Televisit: Pt c/o dark red/browninsh vaginal spotting, vaginal sharp pain, and low abdominal pain.

## 2019-03-29 NOTE — Progress Notes (Addendum)
Patient ID: [name], female   DOB: 1941/01/31, 78 y.o.   MRN: [id number]  Chief Complaint  Patient presents with  . Vaginal Pain  I connected with patient on 03-29-2019 at 1005 and verified that she was [name].  Explained to her that this was a Televisit and discussed the limitations of this type of encounter, and she expressed that she understood and agreed to proceed.   HPI [name] is a 78 y.o. female.  Sharp vaginal pains like a pin prick that comes and goes.  Also has scant brownish soiling of her diaper.  No vaginal bleeding noted and nothing noted from vagina when she wipes. HPI  Past Medical History:  Diagnosis Date  . Acute MI ([place])   . Arthritis   . Asthma   . Atrial fibrillation ([place])   . Diabetes mellitus   . DVT (deep venous thrombosis) ([place])   . Gout   . Gout   . Stroke [facility])     Past Surgical History:  Procedure Laterality Date  . BACK SURGERY Left 2003   left knee replacement and left shoulder  . BREAST EXCISIONAL BIOPSY Left 1983  . FOOT SURGERY    . JOINT REPLACEMENT    . SHOULDER SURGERY      Family History  Problem Relation Age of Onset  . Cancer Brother   . Cancer Mother   . Breast cancer Mother 18    Social History Social History   Tobacco Use  . Smoking status: Former Smoker    Packs/day: 0.25    Years: 30.00    Pack years: 7.50    Types: Cigarettes  . Smokeless tobacco: Former Systems developer    Quit date: 06/13/2004  . Tobacco comment: Quit when she went to nursing home/ states she light smoker; Only smoked periodically; Pack years undetermined  Substance Use Topics  . Alcohol use: Yes    Alcohol/week: 1.0 standard drinks    Types: 1 Standard drinks or equivalent per week    Comment: Occasinaly beer with dinner  . Drug use: Yes    Types: Marijuana    Comment: occasionally    Allergies  Allergen Reactions  . Penicillins Other (See Comments)    Has patient had a PCN reaction causing immediate rash,  facial/tongue/throat swelling, SOB or lightheadedness with hypotension: unknown Has patient had a PCN reaction causing severe rash involving mucus membranes or skin necrosis:unknown Has patient had a PCN reaction that required hospitalization unknown Has patient had a PCN reaction occurring within the last 10 years: unknown If all of the above answers are "NO", then may proceed with Cephalosporin use.     Current Outpatient Medications  Medication Sig Dispense Refill  . apixaban (ELIQUIS) 5 MG TABS tablet Take 1 tablet (5 mg total) by mouth 2 (two) times daily. 180 tablet 0  . metFORMIN (GLUCOPHAGE) 500 MG tablet TAKE 1 TABLET (500 MG TOTAL) BY MOUTH DAILY WITH BREAKFAST. 90 tablet 1  . albuterol (VENTOLIN HFA) 108 (90 BASE) MCG/ACT inhaler Inhale 2 puffs into the lungs every 6 (six) hours as needed for wheezing or shortness of breath (SOB).    Marland Kitchen allopurinol (ZYLOPRIM) 100 MG tablet TAKE 1 TABLET (100 MG TOTAL) BY MOUTH DAILY. (Patient not taking: Reported on 03/29/2019) 90 tablet 1  . atenolol (TENORMIN) 50 MG tablet Take 1 tablet (50 mg total) by mouth daily. (Patient not taking: Reported on 03/29/2019) 90 tablet 3  . cadexomer iodine (IODOSORB) 0.9 % gel  Apply 1 application topically daily as needed for wound care. (Patient not taking: Reported on 09/07/2017) 40 g 0  . clotrimazole (LOTRIMIN) 1 % cream Apply 1 application topically 2 (two) times daily. (Patient not taking: Reported on 09/07/2017) 60 g 1  . DULoxetine (CYMBALTA) 60 MG capsule TAKE 1 CAPSULE (60 MG TOTAL) BY MOUTH DAILY. (Patient not taking: Reported on 03/29/2019) 90 capsule 3  . Fluticasone-Salmeterol (ADVAIR) 100-50 MCG/DOSE AEPB Inhale 1 puff into the lungs 2 (two) times daily.    . furosemide (LASIX) 40 MG tablet Take 1 tablet (40 mg total) by mouth daily as needed (leg swelling). Keep 9/5/ appointment for further refills (Patient not taking: Reported on 03/29/2019) 90 tablet 0  . gabapentin (NEURONTIN) 100 MG capsule TAKE ONE  CAPSULE BY MOUTH AT BEDTIME (Patient not taking: Reported on 03/29/2019) 30 capsule 6  . HYDROcodone-acetaminophen (NORCO/VICODIN) 5-325 MG tablet Take 1-2 tablets by mouth every 6 (six) hours as needed for moderate pain. 20 tablet 0  . lisinopril (PRINIVIL,ZESTRIL) 10 MG tablet Take 1 tablet (10 mg total) by mouth daily. (Patient not taking: Reported on 03/29/2019) 90 tablet 3  . loratadine (CLARITIN) 10 MG tablet TAKE 1 TABLET (10 MG TOTAL) BY MOUTH DAILY AS NEEDED FOR ALLERGIES. (Patient not taking: Reported on 03/29/2019) 90 tablet 1  . omeprazole (PRILOSEC) 20 MG capsule Take 1 capsule (20 mg total) by mouth daily. Keep 9/5/ appointment for further refills (Patient not taking: Reported on 03/29/2019) 90 capsule 0  . simvastatin (ZOCOR) 20 MG tablet Take 1 tablet (20 mg total) by mouth daily. Keep 9/5 appointment for further refills (Patient not taking: Reported on 03/29/2019) 90 tablet 0   No current facility-administered medications for this visit.     Review of Systems Review of Systems Constitutional: negative for fatigue and weight loss Respiratory: negative for cough and wheezing Cardiovascular: negative for chest pain, fatigue and palpitations Gastrointestinal: negative for abdominal pain and change in bowel habits Genitourinary:positive for sharp vaginal pains that comes and goes Integument/breast: negative for nipple discharge Musculoskeletal:negative for myalgias Neurological: negative for gait problems and tremors Behavioral/Psych: negative for abusive relationship, depression Endocrine: negative for temperature intolerance      There were no vitals taken for this visit.  Physical Exam Physical Exam:  Deferred  >50% of 10 min visit spent on counseling and coordination of care.   Data Reviewed CT of abdomen and pelvis  Assessment     1. Vaginal pain - probably referred pain.  Will follow clinically.  2. Lower abdominal pain Rx: - HYDROcodone-acetaminophen (NORCO/VICODIN)  5-325 MG tablet; Take 1-2 tablets by mouth every 6 (six) hours as needed for moderate pain.  Dispense: 20 tablet; Refill: 0  3. Class 3 severe obesity without serious comorbidity with body mass index (BMI) of 50.0 to 59.9 in adult, unspecified obesity type ([place]) - program of caloric reduction, exercise and behavioral modification recommended    Plan    Follow up prn  No orders of the defined types were placed in this encounter.  Meds ordered this encounter  Medications  . DISCONTD: HYDROcodone-acetaminophen (NORCO/VICODIN) 5-325 MG tablet    Sig: Take 1-2 tablets by mouth every 6 (six) hours as needed for moderate pain.    Dispense:  20 tablet    Refill:  0  . HYDROcodone-acetaminophen (NORCO/VICODIN) 5-325 MG tablet    Sig: Take 1-2 tablets by mouth every 6 (six) hours as needed for moderate pain.    Dispense:  20 tablet  Refill:  0    Shelly Bombard MD 03-29-2019

## 2019-04-08 ENCOUNTER — Other Ambulatory Visit: Payer: Self-pay | Admitting: Obstetrics

## 2019-05-02 DIAGNOSIS — R0789 Other chest pain: Secondary | ICD-10-CM | POA: Diagnosis not present

## 2019-05-02 DIAGNOSIS — I482 Chronic atrial fibrillation, unspecified: Secondary | ICD-10-CM | POA: Diagnosis not present

## 2019-05-02 DIAGNOSIS — I10 Essential (primary) hypertension: Secondary | ICD-10-CM | POA: Diagnosis not present

## 2019-05-02 DIAGNOSIS — E119 Type 2 diabetes mellitus without complications: Secondary | ICD-10-CM | POA: Diagnosis not present

## 2019-05-02 DIAGNOSIS — I2510 Atherosclerotic heart disease of native coronary artery without angina pectoris: Secondary | ICD-10-CM | POA: Diagnosis not present

## 2019-05-16 ENCOUNTER — Telehealth: Payer: Self-pay | Admitting: Internal Medicine

## 2019-05-16 NOTE — Telephone Encounter (Signed)
Spoke to patient and informed her we cannot send in refills without an appointment, and we would call her back to scheduled. Please advise if patient can come in

## 2019-05-16 NOTE — Telephone Encounter (Signed)
Patient has not been seen since 2018. If patient has no symptoms are you okay with them making an in office visit? Covid risk 6

## 2019-05-16 NOTE — Telephone Encounter (Signed)
Tried calling patient and no VM was set up. Will try again later

## 2019-05-16 NOTE — Telephone Encounter (Signed)
Copied from [place] [id number]. Topic: Quick Communication - Rx Refill/Question >> May 16, 2019 11:58 AM Mathis Bud wrote: Medication: Patient called in stating she would like every current medication she is on refilled  Has the patient contacted their pharmacy? Yes, pharmacy has no medication.   Preferred Pharmacy (with phone number or street name): [facility], [address] [phone] (Phone) [phone] (Fax)    Agent: Please be advised that RX refills may take up to 3 business days. We ask that you follow-up with your pharmacy.

## 2019-05-16 NOTE — Telephone Encounter (Signed)
Can you make patient an in office visit so she can get refills of medications

## 2019-05-16 NOTE — Telephone Encounter (Signed)
Okay with in person visit

## 2019-05-21 NOTE — Telephone Encounter (Signed)
Noted refills were refused patient will most likely call in to schedule or ask about why refused.

## 2019-05-21 NOTE — Telephone Encounter (Signed)
Tried to call again and no VM set up

## 2019-05-22 DIAGNOSIS — M7989 Other specified soft tissue disorders: Secondary | ICD-10-CM | POA: Diagnosis not present

## 2019-05-22 DIAGNOSIS — L600 Ingrowing nail: Secondary | ICD-10-CM | POA: Diagnosis not present

## 2019-05-22 DIAGNOSIS — E119 Type 2 diabetes mellitus without complications: Secondary | ICD-10-CM | POA: Diagnosis not present

## 2019-05-22 DIAGNOSIS — I739 Peripheral vascular disease, unspecified: Secondary | ICD-10-CM | POA: Diagnosis not present

## 2019-05-22 DIAGNOSIS — B351 Tinea unguium: Secondary | ICD-10-CM | POA: Diagnosis not present

## 2019-05-22 DIAGNOSIS — Z79899 Other long term (current) drug therapy: Secondary | ICD-10-CM | POA: Diagnosis not present

## 2019-05-22 DIAGNOSIS — M792 Neuralgia and neuritis, unspecified: Secondary | ICD-10-CM | POA: Diagnosis not present

## 2019-05-23 ENCOUNTER — Telehealth: Payer: Self-pay | Admitting: *Deleted

## 2019-05-23 NOTE — Telephone Encounter (Signed)
Pt called to office stating she would like Rx for pain she is having.  Attempt to return call, no answer no VM.

## 2019-05-30 DIAGNOSIS — M25512 Pain in left shoulder: Secondary | ICD-10-CM | POA: Diagnosis not present

## 2019-05-30 DIAGNOSIS — Z96653 Presence of artificial knee joint, bilateral: Secondary | ICD-10-CM | POA: Diagnosis not present

## 2019-05-30 DIAGNOSIS — M25511 Pain in right shoulder: Secondary | ICD-10-CM | POA: Diagnosis not present

## 2019-05-30 DIAGNOSIS — Z471 Aftercare following joint replacement surgery: Secondary | ICD-10-CM | POA: Diagnosis not present

## 2019-05-31 DIAGNOSIS — N319 Neuromuscular dysfunction of bladder, unspecified: Secondary | ICD-10-CM | POA: Diagnosis not present

## 2019-06-10 DIAGNOSIS — M25561 Pain in right knee: Secondary | ICD-10-CM | POA: Diagnosis not present

## 2019-06-10 DIAGNOSIS — M25562 Pain in left knee: Secondary | ICD-10-CM | POA: Diagnosis not present

## 2019-06-12 DIAGNOSIS — M7989 Other specified soft tissue disorders: Secondary | ICD-10-CM | POA: Diagnosis not present

## 2019-06-17 DIAGNOSIS — M25562 Pain in left knee: Secondary | ICD-10-CM | POA: Diagnosis not present

## 2019-06-17 DIAGNOSIS — M25561 Pain in right knee: Secondary | ICD-10-CM | POA: Diagnosis not present

## 2019-06-19 ENCOUNTER — Other Ambulatory Visit (HOSPITAL_COMMUNITY): Payer: Self-pay | Admitting: Cardiology

## 2019-06-19 DIAGNOSIS — R079 Chest pain, unspecified: Secondary | ICD-10-CM

## 2019-06-20 DIAGNOSIS — M25561 Pain in right knee: Secondary | ICD-10-CM | POA: Diagnosis not present

## 2019-06-20 DIAGNOSIS — M25562 Pain in left knee: Secondary | ICD-10-CM | POA: Diagnosis not present

## 2019-06-21 ENCOUNTER — Ambulatory Visit (HOSPITAL_COMMUNITY)
Admission: RE | Admit: 2019-06-21 | Discharge: 2019-06-21 | Disposition: A | Discharge status: Home / Self Care | Payer: Medicare Other | Source: Ambulatory Visit | Attending: Cardiology | Admitting: Cardiology

## 2019-06-21 ENCOUNTER — Other Ambulatory Visit: Payer: Self-pay

## 2019-06-21 DIAGNOSIS — E119 Type 2 diabetes mellitus without complications: Secondary | ICD-10-CM | POA: Diagnosis not present

## 2019-06-21 DIAGNOSIS — R079 Chest pain, unspecified: Secondary | ICD-10-CM | POA: Diagnosis not present

## 2019-06-21 DIAGNOSIS — R9431 Abnormal electrocardiogram [ECG] [EKG]: Secondary | ICD-10-CM | POA: Diagnosis not present

## 2019-06-21 DIAGNOSIS — R0789 Other chest pain: Secondary | ICD-10-CM | POA: Diagnosis not present

## 2019-06-21 DIAGNOSIS — I2510 Atherosclerotic heart disease of native coronary artery without angina pectoris: Secondary | ICD-10-CM | POA: Diagnosis not present

## 2019-06-21 DIAGNOSIS — I482 Chronic atrial fibrillation, unspecified: Secondary | ICD-10-CM | POA: Diagnosis not present

## 2019-06-21 MED ORDER — REGADENOSON 0.4 MG/5ML IV SOLN
0.4000 mg | Freq: Once | INTRAVENOUS | Status: AC
  Administered 2019-06-21: 10:00:00 0.4 mg via INTRAVENOUS

## 2019-06-21 MED ORDER — REGADENOSON 0.4 MG/5ML IV SOLN
INTRAVENOUS | Status: AC
  Filled 2019-06-21: qty 5

## 2019-06-24 DIAGNOSIS — M25561 Pain in right knee: Secondary | ICD-10-CM | POA: Diagnosis not present

## 2019-06-24 DIAGNOSIS — M25562 Pain in left knee: Secondary | ICD-10-CM | POA: Diagnosis not present

## 2019-06-26 DIAGNOSIS — N319 Neuromuscular dysfunction of bladder, unspecified: Secondary | ICD-10-CM | POA: Diagnosis not present

## 2019-07-01 DIAGNOSIS — M25561 Pain in right knee: Secondary | ICD-10-CM | POA: Diagnosis not present

## 2019-07-01 DIAGNOSIS — M25562 Pain in left knee: Secondary | ICD-10-CM | POA: Diagnosis not present

## 2019-07-03 DIAGNOSIS — M25562 Pain in left knee: Secondary | ICD-10-CM | POA: Diagnosis not present

## 2019-07-03 DIAGNOSIS — M79675 Pain in left toe(s): Secondary | ICD-10-CM | POA: Diagnosis not present

## 2019-07-03 DIAGNOSIS — B351 Tinea unguium: Secondary | ICD-10-CM | POA: Diagnosis not present

## 2019-07-03 DIAGNOSIS — L600 Ingrowing nail: Secondary | ICD-10-CM | POA: Diagnosis not present

## 2019-07-03 DIAGNOSIS — M79674 Pain in right toe(s): Secondary | ICD-10-CM | POA: Diagnosis not present

## 2019-07-03 DIAGNOSIS — M25561 Pain in right knee: Secondary | ICD-10-CM | POA: Diagnosis not present

## 2019-08-02 DIAGNOSIS — N319 Neuromuscular dysfunction of bladder, unspecified: Secondary | ICD-10-CM | POA: Diagnosis not present

## 2019-08-07 ENCOUNTER — Other Ambulatory Visit: Payer: Self-pay

## 2019-08-07 ENCOUNTER — Ambulatory Visit (INDEPENDENT_AMBULATORY_CARE_PROVIDER_SITE_OTHER): Payer: Medicare Other | Admitting: Obstetrics and Gynecology

## 2019-08-07 ENCOUNTER — Encounter: Payer: Self-pay | Admitting: Obstetrics and Gynecology

## 2019-08-07 DIAGNOSIS — R109 Unspecified abdominal pain: Secondary | ICD-10-CM | POA: Insufficient documentation

## 2019-08-07 DIAGNOSIS — R1084 Generalized abdominal pain: Secondary | ICD-10-CM | POA: Diagnosis not present

## 2019-08-07 NOTE — Progress Notes (Signed)
[name] presents with a vague c/o of abd pain. Off/On last month. Had similar pain in Oct and Saw Dr. Jodi Mourning. Reports pain resolved with pain medication of Vicodin. She denies any bowel or bladder dysfunction. Tolerating regular diet. Occ GERD as she likes spicy food. She has not tried any OTC meds for the pain No vaginal discharge or bleeding Not sexual active Pap 3/18 normal CT scan 8/18 small 3.4 cm uterine fibroid  PE AF VSS Lungs clear  Heart RRR Abd soft + BS non tender  A/P Abd pain  No specific Gyn cause of pt's pain identified today by [facility]. Suspected GI in origin. Pt instructed to decrease spicy food intake. Pt informed can not prescribe Vicodin. Pt instructed to follow up with PCP if continues to have pain.

## 2019-08-08 ENCOUNTER — Telehealth: Payer: Self-pay | Admitting: Internal Medicine

## 2019-08-08 NOTE — Telephone Encounter (Signed)
noted 

## 2019-08-08 NOTE — Telephone Encounter (Signed)
Medication: metFORMIN (GLUCOPHAGE) 500 MG tablet [[id number]] , furosemide (LASIX) 40 MG tablet [[id number]] , allopurinol (ZYLOPRIM) 100 MG tablet [[id number]] , apixaban (ELIQUIS) 5 MG TABS tablet [[id number]] , atenolol (TENORMIN) 50 MG tablet [[id number]] , DULoxetine (CYMBALTA) 60 MG capsule [[id number]] , Fluticasone-Salmeterol (ADVAIR) 100-50 MCG/DOSE AEPB [[id number]] , Fluticasone-Salmeterol (ADVAIR) 100-50 MCG/DOSE AEPB [[id number]] , lisinopril (PRINIVIL,ZESTRIL) 10 MG tablet [[id number]] , loratadine (CLARITIN) 10 MG tablet [[id number]] , omeprazole (PRILOSEC) 20 MG capsule [[id number]] , simvastatin (ZOCOR) 20 MG tablet [[id number]]   Has the patient contacted their pharmacy? Yes  (Agent: If no, request that the patient contact the pharmacy for the refill.) (Agent: If yes, when and what did the pharmacy advise?)  Preferred Pharmacy (with phone number or street name): [facility], [address] [phone] (Phone) [phone] (Fax)    Agent: Please be advised that RX refills may take up to 3 business days. We ask that you follow-up with your pharmacy.

## 2019-08-08 NOTE — Telephone Encounter (Signed)
Patient needs an appointment have not been seen in over a year

## 2019-08-08 NOTE — Telephone Encounter (Signed)
Tried reaching pt.  No answer.  Not able to leave VM.

## 2019-08-27 ENCOUNTER — Other Ambulatory Visit: Payer: Self-pay | Admitting: Internal Medicine

## 2019-09-12 DIAGNOSIS — N319 Neuromuscular dysfunction of bladder, unspecified: Secondary | ICD-10-CM | POA: Diagnosis not present

## 2019-10-08 DIAGNOSIS — N319 Neuromuscular dysfunction of bladder, unspecified: Secondary | ICD-10-CM | POA: Diagnosis not present

## 2019-10-31 DIAGNOSIS — N319 Neuromuscular dysfunction of bladder, unspecified: Secondary | ICD-10-CM | POA: Diagnosis not present

## 2019-11-11 DIAGNOSIS — H5212 Myopia, left eye: Secondary | ICD-10-CM | POA: Diagnosis not present

## 2019-11-11 DIAGNOSIS — E119 Type 2 diabetes mellitus without complications: Secondary | ICD-10-CM | POA: Diagnosis not present

## 2019-11-11 DIAGNOSIS — H26493 Other secondary cataract, bilateral: Secondary | ICD-10-CM | POA: Diagnosis not present

## 2019-11-11 DIAGNOSIS — H5201 Hypermetropia, right eye: Secondary | ICD-10-CM | POA: Diagnosis not present

## 2019-11-11 DIAGNOSIS — H524 Presbyopia: Secondary | ICD-10-CM | POA: Diagnosis not present

## 2019-11-12 DIAGNOSIS — H5213 Myopia, bilateral: Secondary | ICD-10-CM | POA: Diagnosis not present

## 2019-11-12 DIAGNOSIS — H524 Presbyopia: Secondary | ICD-10-CM | POA: Diagnosis not present

## 2019-11-19 ENCOUNTER — Encounter: Payer: Self-pay | Admitting: Obstetrics

## 2019-12-03 DIAGNOSIS — N319 Neuromuscular dysfunction of bladder, unspecified: Secondary | ICD-10-CM | POA: Diagnosis not present

## 2020-01-07 DIAGNOSIS — N319 Neuromuscular dysfunction of bladder, unspecified: Secondary | ICD-10-CM | POA: Diagnosis not present

## 2020-01-08 DIAGNOSIS — E119 Type 2 diabetes mellitus without complications: Secondary | ICD-10-CM | POA: Diagnosis not present

## 2020-01-08 DIAGNOSIS — E785 Hyperlipidemia, unspecified: Secondary | ICD-10-CM | POA: Diagnosis not present

## 2020-01-08 DIAGNOSIS — I2510 Atherosclerotic heart disease of native coronary artery without angina pectoris: Secondary | ICD-10-CM | POA: Diagnosis not present

## 2020-01-08 DIAGNOSIS — I482 Chronic atrial fibrillation, unspecified: Secondary | ICD-10-CM | POA: Diagnosis not present

## 2020-01-08 DIAGNOSIS — I10 Essential (primary) hypertension: Secondary | ICD-10-CM | POA: Diagnosis not present

## 2020-02-04 DIAGNOSIS — N319 Neuromuscular dysfunction of bladder, unspecified: Secondary | ICD-10-CM | POA: Diagnosis not present

## 2020-03-02 DIAGNOSIS — N319 Neuromuscular dysfunction of bladder, unspecified: Secondary | ICD-10-CM | POA: Diagnosis not present

## 2020-03-31 DIAGNOSIS — N319 Neuromuscular dysfunction of bladder, unspecified: Secondary | ICD-10-CM | POA: Diagnosis not present

## 2020-05-15 DIAGNOSIS — E785 Hyperlipidemia, unspecified: Secondary | ICD-10-CM | POA: Diagnosis not present

## 2020-05-15 DIAGNOSIS — I482 Chronic atrial fibrillation, unspecified: Secondary | ICD-10-CM | POA: Diagnosis not present

## 2020-05-15 DIAGNOSIS — I2510 Atherosclerotic heart disease of native coronary artery without angina pectoris: Secondary | ICD-10-CM | POA: Diagnosis not present

## 2020-05-15 DIAGNOSIS — I10 Essential (primary) hypertension: Secondary | ICD-10-CM | POA: Diagnosis not present

## 2020-05-15 DIAGNOSIS — E039 Hypothyroidism, unspecified: Secondary | ICD-10-CM | POA: Diagnosis not present

## 2020-07-15 DIAGNOSIS — N319 Neuromuscular dysfunction of bladder, unspecified: Secondary | ICD-10-CM | POA: Diagnosis not present

## 2020-12-07 ENCOUNTER — Telehealth: Payer: Self-pay

## 2020-12-07 NOTE — Telephone Encounter (Signed)
S/w pt and she needs assistance with getting referral for diapers from medical supply, pt will discuss with harper tomorrow at appointment.

## 2020-12-08 ENCOUNTER — Ambulatory Visit: Payer: 59 | Admitting: Obstetrics

## 2020-12-15 ENCOUNTER — Encounter: Payer: Self-pay | Admitting: Obstetrics

## 2020-12-15 ENCOUNTER — Other Ambulatory Visit: Payer: Self-pay

## 2020-12-15 ENCOUNTER — Ambulatory Visit (INDEPENDENT_AMBULATORY_CARE_PROVIDER_SITE_OTHER): Payer: 59 | Admitting: Obstetrics

## 2020-12-15 VITALS — BP 139/82 | HR 80 | Ht 63.0 in | Wt 142.0 lb

## 2020-12-15 DIAGNOSIS — Z1239 Encounter for other screening for malignant neoplasm of breast: Secondary | ICD-10-CM

## 2020-12-15 DIAGNOSIS — Z01419 Encounter for gynecological examination (general) (routine) without abnormal findings: Secondary | ICD-10-CM | POA: Diagnosis not present

## 2020-12-15 NOTE — Progress Notes (Signed)
GYN presents foe AEX.  Reports no problems today.

## 2020-12-15 NOTE — Progress Notes (Signed)
Subjective:        [name] is a 79 y.o. female here for a routine exam.  Current complaints: None.    Personal health questionnaire:  Is patient Ashkenazi Jewish, have a family history of breast and/or ovarian cancer: yes Is there a family history of uterine cancer diagnosed at age < 35, gastrointestinal cancer, urinary tract cancer, family member who is a Field seismologist syndrome-associated carrier: no Is the patient overweight and hypertensive, family history of diabetes, personal history of gestational diabetes, preeclampsia or PCOS: yes Is patient over 4, have PCOS,  family history of premature CHD under age 48, diabetes, smoke, have hypertension or peripheral artery disease:  no At any time, has a partner hit, kicked or otherwise hurt or frightened you?: no Over the past 2 weeks, have you felt down, depressed or hopeless?: no Over the past 2 weeks, have you felt little interest or pleasure in doing things?:no   Gynecologic History No LMP recorded. Patient is postmenopausal. Contraception: post menopausal status Last Pap: 2018. Results were: normal Last mammogram: 2018. Results were: normal  Obstetric History OB History  No obstetric history on file.    Past Medical History:  Diagnosis Date  . Acute MI ([place])   . Arthritis   . Asthma   . Atrial fibrillation ([place])   . Diabetes mellitus   . DVT (deep venous thrombosis) ([place])   . Gout   . Gout   . Stroke [facility])     Past Surgical History:  Procedure Laterality Date  . BACK SURGERY Left 2003   left knee replacement and left shoulder  . BREAST EXCISIONAL BIOPSY Left 1983  . FOOT SURGERY    . JOINT REPLACEMENT    . SHOULDER SURGERY       Current Outpatient Medications:  .  albuterol (VENTOLIN HFA) 108 (90 Base) MCG/ACT inhaler, Inhale 2 puffs into the lungs every 6 (six) hours as needed for wheezing or shortness of breath (SOB)., Disp: , Rfl:  .  apixaban (ELIQUIS) 5 MG TABS tablet, Take 1 tablet (5 mg total)  by mouth 2 (two) times daily., Disp: 180 tablet, Rfl: 0 .  Fluticasone-Salmeterol (ADVAIR) 100-50 MCG/DOSE AEPB, Inhale 1 puff into the lungs 2 (two) times daily., Disp: , Rfl:  .  furosemide (LASIX) 40 MG tablet, Take 1 tablet (40 mg total) by mouth daily as needed (leg swelling). Keep 9/5/ appointment for further refills, Disp: 90 tablet, Rfl: 0 .  losartan (COZAAR) 50 MG tablet, Take 50 mg by mouth daily., Disp: , Rfl:  .  metFORMIN (GLUCOPHAGE) 500 MG tablet, TAKE 1 TABLET (500 MG TOTAL) BY MOUTH DAILY WITH BREAKFAST., Disp: 90 tablet, Rfl: 1 .  metoprolol tartrate (LOPRESSOR) 50 MG tablet, Take 50 mg by mouth 2 (two) times daily., Disp: , Rfl:  .  allopurinol (ZYLOPRIM) 100 MG tablet, TAKE 1 TABLET (100 MG TOTAL) BY MOUTH DAILY. (Patient not taking: Reported on 03/29/2019), Disp: 90 tablet, Rfl: 1 .  atenolol (TENORMIN) 50 MG tablet, Take 1 tablet (50 mg total) by mouth daily. (Patient not taking: Reported on 03/29/2019), Disp: 90 tablet, Rfl: 3 .  cadexomer iodine (IODOSORB) 0.9 % gel, Apply 1 application topically daily as needed for wound care. (Patient not taking: Reported on 09/07/2017), Disp: 40 g, Rfl: 0 .  clotrimazole (LOTRIMIN) 1 % cream, Apply 1 application topically 2 (two) times daily. (Patient not taking: Reported on 09/07/2017), Disp: 60 g, Rfl: 1 .  DULoxetine (CYMBALTA) 60 MG capsule, TAKE  1 CAPSULE (60 MG TOTAL) BY MOUTH DAILY. (Patient not taking: Reported on 03/29/2019), Disp: 90 capsule, Rfl: 3 .  gabapentin (NEURONTIN) 100 MG capsule, TAKE ONE CAPSULE BY MOUTH AT BEDTIME (Patient not taking: Reported on 03/29/2019), Disp: 30 capsule, Rfl: 6 .  HYDROcodone-acetaminophen (NORCO/VICODIN) 5-325 MG tablet, Take 1-2 tablets by mouth every 6 (six) hours as needed for moderate pain. (Patient not taking: Reported on 08/07/2019), Disp: 20 tablet, Rfl: 0 .  Lactobacillus (BIOTINEX) CAPS, Take by mouth. (Patient not taking: Reported on 12/15/2020), Disp: , Rfl:  .  lisinopril (PRINIVIL,ZESTRIL) 10  MG tablet, Take 1 tablet (10 mg total) by mouth daily. (Patient not taking: Reported on 03/29/2019), Disp: 90 tablet, Rfl: 3 .  loratadine (CLARITIN) 10 MG tablet, TAKE 1 TABLET (10 MG TOTAL) BY MOUTH DAILY AS NEEDED FOR ALLERGIES. (Patient not taking: Reported on 03/29/2019), Disp: 90 tablet, Rfl: 1 .  omeprazole (PRILOSEC) 20 MG capsule, Take 1 capsule (20 mg total) by mouth daily. Keep 9/5/ appointment for further refills (Patient not taking: No sig reported), Disp: 90 capsule, Rfl: 0 .  potassium chloride (KLOR-CON) 20 MEQ packet, Take by mouth 2 (two) times daily. (Patient not taking: Reported on 12/15/2020), Disp: , Rfl:  .  predniSONE (DELTASONE) 5 MG tablet, Take 5 mg by mouth daily with breakfast. (Patient not taking: Reported on 12/15/2020), Disp: , Rfl:  .  simvastatin (ZOCOR) 20 MG tablet, Take 1 tablet (20 mg total) by mouth daily. Keep 9/5 appointment for further refills (Patient not taking: Reported on 03/29/2019), Disp: 90 tablet, Rfl: 0 .  terbinafine (LAMISIL) 250 MG tablet, Take 250 mg by mouth daily., Disp: , Rfl:  Allergies  Allergen Reactions  . Penicillins Other (See Comments)    Has patient had a PCN reaction causing immediate rash, facial/tongue/throat swelling, SOB or lightheadedness with hypotension: unknown Has patient had a PCN reaction causing severe rash involving mucus membranes or skin necrosis:unknown Has patient had a PCN reaction that required hospitalization unknown Has patient had a PCN reaction occurring within the last 10 years: unknown If all of the above answers are "NO", then may proceed with Cephalosporin use.     Social History   Tobacco Use  . Smoking status: Former Smoker    Packs/day: 0.25    Years: 30.00    Pack years: 7.50    Types: Cigarettes  . Smokeless tobacco: Former Systems developer    Quit date: 06/13/2004  . Tobacco comment: Quit when she went to nursing home/ states she light smoker; Only smoked periodically; Pack years undetermined  Substance Use  Topics  . Alcohol use: Yes    Alcohol/week: 1.0 standard drink    Types: 1 Standard drinks or equivalent per week    Comment: Occasinaly beer with dinner    Family History  Problem Relation Age of Onset  . Cancer Brother   . Cancer Mother   . Breast cancer Mother 31      Review of Systems  Constitutional: negative for fatigue and weight loss Respiratory: negative for cough and wheezing Cardiovascular: negative for chest pain, fatigue and palpitations Gastrointestinal: negative for abdominal pain and change in bowel habits Musculoskeletal:negative for myalgias Neurological: negative for gait problems and tremors Behavioral/Psych: negative for abusive relationship, depression Endocrine: negative for temperature intolerance    Genitourinary:negative for abnormal menstrual periods, genital lesions, hot flashes, sexual problems and vaginal discharge Integument/breast: negative for breast lump, breast tenderness, nipple discharge and skin lesion(s)    Objective:  BP 139/82 (BP Location: Left Arm, Cuff Size: Normal)   Pulse 80   Ht 5\' 3"  (1.6 m)   Wt 142 lb (64.4 kg)   BMI 25.15 kg/m  General:   alert and no distress  Skin:   no rash or abnormalities  Lungs:   clear to auscultation bilaterally  Heart:   regular rate and rhythm, S1, S2 normal, no murmur, click, rub or gallop  Breasts:   normal without suspicious masses, skin or nipple changes or axillary nodes  Abdomen:  normal findings: no organomegaly, soft, non-tender and no hernia  Pelvis:  External genitalia: normal general appearance Urinary system: urethral meatus normal and bladder without fullness, nontender Vaginal: normal without tenderness, induration or masses Cervix: normal appearance Adnexa: normal bimanual exam Uterus: anteverted and non-tender, normal size   Lab Review Urine pregnancy test Labs reviewed yes Radiologic studies reviewed yes  50% of 20 min visit spent on counseling and coordination of  care.   Assessment:     1. Encounter for routine gynecologic examination in Medicare patient  2. Screening breast examination Rx: - MM Digital Screening; Future    Plan:    Education reviewed: calcium supplements, depression evaluation, low fat, low cholesterol diet, safe sex/STD prevention, self breast exams, smoking cessation and weight bearing exercise. Mammogram ordered. Follow up in: 2 years.    Shelly Bombard, MD 12/15/2020 11:07 AM

## 2020-12-16 ENCOUNTER — Telehealth: Payer: Self-pay | Admitting: *Deleted

## 2020-12-16 NOTE — Telephone Encounter (Signed)
Pt called to office stating she needs referral form sent to [employer] for her to get her Urinary incont supplies sent to her home.  Dr [name] has completed these in the past. Pt states she no longer sees Dr [name] and ask if Dr Jodi Mourning can complete them.  Pt advised to have [employer] fax the correct forms to the office and we can have Dr Jodi Mourning complete and sign.

## 2021-02-16 ENCOUNTER — Other Ambulatory Visit: Payer: Self-pay | Admitting: Registered Nurse

## 2021-02-16 DIAGNOSIS — E2839 Other primary ovarian failure: Secondary | ICD-10-CM

## 2021-03-30 ENCOUNTER — Ambulatory Visit (INDEPENDENT_AMBULATORY_CARE_PROVIDER_SITE_OTHER): Payer: Medicare Other | Admitting: Podiatry

## 2021-03-30 DIAGNOSIS — Z5329 Procedure and treatment not carried out because of patient's decision for other reasons: Secondary | ICD-10-CM

## 2021-03-30 NOTE — Progress Notes (Signed)
No show for appt. 

## 2021-04-21 ENCOUNTER — Ambulatory Visit: Payer: Medicare Other | Admitting: Podiatry

## 2021-04-26 ENCOUNTER — Other Ambulatory Visit: Payer: Self-pay

## 2021-04-26 ENCOUNTER — Ambulatory Visit (INDEPENDENT_AMBULATORY_CARE_PROVIDER_SITE_OTHER): Payer: Medicare Other

## 2021-04-26 ENCOUNTER — Ambulatory Visit (INDEPENDENT_AMBULATORY_CARE_PROVIDER_SITE_OTHER): Payer: Medicare Other | Admitting: Podiatry

## 2021-04-26 DIAGNOSIS — I739 Peripheral vascular disease, unspecified: Secondary | ICD-10-CM

## 2021-04-26 DIAGNOSIS — M79674 Pain in right toe(s): Secondary | ICD-10-CM | POA: Diagnosis not present

## 2021-04-26 DIAGNOSIS — M79671 Pain in right foot: Secondary | ICD-10-CM

## 2021-04-26 DIAGNOSIS — M722 Plantar fascial fibromatosis: Secondary | ICD-10-CM | POA: Diagnosis not present

## 2021-04-26 DIAGNOSIS — M79675 Pain in left toe(s): Secondary | ICD-10-CM | POA: Diagnosis not present

## 2021-04-26 DIAGNOSIS — B351 Tinea unguium: Secondary | ICD-10-CM

## 2021-04-26 DIAGNOSIS — Z7901 Long term (current) use of anticoagulants: Secondary | ICD-10-CM | POA: Diagnosis not present

## 2021-04-26 DIAGNOSIS — M779 Enthesopathy, unspecified: Secondary | ICD-10-CM

## 2021-04-26 DIAGNOSIS — M79672 Pain in left foot: Secondary | ICD-10-CM

## 2021-04-26 NOTE — Progress Notes (Signed)
Subjective:   Patient ID: [name], female   DOB: 80 y.o.   MRN: [id number]   HPI 80 year old female presents the office today for evaluation of bilateral foot pain.  She says the foot hurts "all over".  She has a brace that she wears occasionally that she had from another foot doctor.  She had surgery about 6 years ago on both feet but other per the Probation officer.  She is diabetic but unsure A1c or blood sugar.  She has a history of gout.  Also asking for the nails to be trimmed today as are thickened elongated she cannot do them herself.  She said no recent treatment.  She has no other concerns.   Review of Systems  All other systems reviewed and are negative.  Past Medical History:  Diagnosis Date  . Acute MI ([place])   . Arthritis   . Asthma   . Atrial fibrillation ([place])   . Diabetes mellitus   . DVT (deep venous thrombosis) ([place])   . Gout   . Gout   . Stroke [facility])     Past Surgical History:  Procedure Laterality Date  . BACK SURGERY Left 2003   left knee replacement and left shoulder  . BREAST EXCISIONAL BIOPSY Left 1983  . FOOT SURGERY    . JOINT REPLACEMENT    . SHOULDER SURGERY       Current Outpatient Medications:  .  albuterol (VENTOLIN HFA) 108 (90 Base) MCG/ACT inhaler, Inhale 2 puffs into the lungs every 6 (six) hours as needed for wheezing or shortness of breath (SOB)., Disp: , Rfl:  .  allopurinol (ZYLOPRIM) 100 MG tablet, TAKE 1 TABLET (100 MG TOTAL) BY MOUTH DAILY. (Patient not taking: Reported on 03/29/2019), Disp: 90 tablet, Rfl: 1 .  apixaban (ELIQUIS) 5 MG TABS tablet, Take 1 tablet (5 mg total) by mouth 2 (two) times daily., Disp: 180 tablet, Rfl: 0 .  atenolol (TENORMIN) 50 MG tablet, Take 1 tablet (50 mg total) by mouth daily. (Patient not taking: Reported on 03/29/2019), Disp: 90 tablet, Rfl: 3 .  cadexomer iodine (IODOSORB) 0.9 % gel, Apply 1 application topically daily as needed for wound care. (Patient not taking: Reported on 09/07/2017), Disp: 40 g,  Rfl: 0 .  clotrimazole (LOTRIMIN) 1 % cream, Apply 1 application topically 2 (two) times daily. (Patient not taking: Reported on 09/07/2017), Disp: 60 g, Rfl: 1 .  colchicine 0.6 MG tablet, Take 0.6 mg by mouth 2 (two) times daily., Disp: , Rfl:  .  diclofenac Sodium (VOLTAREN) 1 % GEL, Voltaren 1 % topical gel  APPLY 2 GRAMS TO THE AFFECTED AREA(S) BY TOPICAL ROUTE 4 TIMES PER DAY, Disp: , Rfl:  .  DULoxetine (CYMBALTA) 60 MG capsule, TAKE 1 CAPSULE (60 MG TOTAL) BY MOUTH DAILY. (Patient not taking: Reported on 03/29/2019), Disp: 90 capsule, Rfl: 3 .  Fluticasone-Salmeterol (ADVAIR) 100-50 MCG/DOSE AEPB, Inhale 1 puff into the lungs 2 (two) times daily., Disp: , Rfl:  .  furosemide (LASIX) 40 MG tablet, Take 1 tablet (40 mg total) by mouth daily as needed (leg swelling). Keep 9/5/ appointment for further refills, Disp: 90 tablet, Rfl: 0 .  gabapentin (NEURONTIN) 100 MG capsule, TAKE ONE CAPSULE BY MOUTH AT BEDTIME (Patient not taking: Reported on 03/29/2019), Disp: 30 capsule, Rfl: 6 .  HYDROcodone-acetaminophen (NORCO/VICODIN) 5-325 MG tablet, Take 1-2 tablets by mouth every 6 (six) hours as needed for moderate pain. (Patient not taking: Reported on 08/07/2019), Disp: 20 tablet, Rfl: 0 .  Lactobacillus (BIOTINEX) CAPS, Take by mouth. (Patient not taking: Reported on 12/15/2020), Disp: , Rfl:  .  lisinopril (PRINIVIL,ZESTRIL) 10 MG tablet, Take 1 tablet (10 mg total) by mouth daily. (Patient not taking: Reported on 03/29/2019), Disp: 90 tablet, Rfl: 3 .  loratadine (CLARITIN) 10 MG tablet, TAKE 1 TABLET (10 MG TOTAL) BY MOUTH DAILY AS NEEDED FOR ALLERGIES. (Patient not taking: Reported on 03/29/2019), Disp: 90 tablet, Rfl: 1 .  losartan (COZAAR) 25 MG tablet, Take 25 mg by mouth daily., Disp: , Rfl:  .  losartan (COZAAR) 50 MG tablet, Take 50 mg by mouth daily., Disp: , Rfl:  .  metFORMIN (GLUCOPHAGE) 500 MG tablet, TAKE 1 TABLET (500 MG TOTAL) BY MOUTH DAILY WITH BREAKFAST., Disp: 90 tablet, Rfl: 1 .   metoprolol succinate (TOPROL-XL) 50 MG 24 hr tablet, metoprolol succinate ER 50 mg tablet,extended release 24 hr, Disp: , Rfl:  .  metoprolol tartrate (LOPRESSOR) 50 MG tablet, Take 50 mg by mouth 2 (two) times daily., Disp: , Rfl:  .  omeprazole (PRILOSEC) 20 MG capsule, Take 1 capsule (20 mg total) by mouth daily. Keep 9/5/ appointment for further refills (Patient not taking: No sig reported), Disp: 90 capsule, Rfl: 0 .  oxyCODONE-acetaminophen (PERCOCET/ROXICET) 5-325 MG tablet, Take 1 tablet by mouth 3 (three) times daily as needed., Disp: , Rfl:  .  potassium chloride (KLOR-CON) 20 MEQ packet, Take by mouth 2 (two) times daily. (Patient not taking: Reported on 12/15/2020), Disp: , Rfl:  .  potassium chloride SA (KLOR-CON) 20 MEQ tablet, potassium chloride ER 20 mEq tablet,extended release(part/cryst), Disp: , Rfl:  .  predniSONE (DELTASONE) 5 MG tablet, Take 5 mg by mouth daily with breakfast. (Patient not taking: Reported on 12/15/2020), Disp: , Rfl:  .  simvastatin (ZOCOR) 20 MG tablet, Take 1 tablet (20 mg total) by mouth daily. Keep 9/5 appointment for further refills (Patient not taking: Reported on 03/29/2019), Disp: 90 tablet, Rfl: 0 .  terbinafine (LAMISIL) 250 MG tablet, Take 250 mg by mouth daily., Disp: , Rfl:   Allergies  Allergen Reactions  . Penicillins Other (See Comments)    Has patient had a PCN reaction causing immediate rash, facial/tongue/throat swelling, SOB or lightheadedness with hypotension: unknown Has patient had a PCN reaction causing severe rash involving mucus membranes or skin necrosis:unknown Has patient had a PCN reaction that required hospitalization unknown Has patient had a PCN reaction occurring within the last 10 years: unknown If all of the above answers are "NO", then may proceed with Cephalosporin use.           Objective:  Physical Exam  General: AAO x3, NAD  Dermatological: There are no open sores or skin rashes identified today.  No callus  formation.  Nails are hypertrophic, dystrophic and discolored there is tenderness nails 1-5 bilaterally.  No edema, erythema.  Vascular: Dorsalis Pedis artery and Posterior Tibial artery pedal pulses are 1/4 bilateral with immedate capillary fill time.  There is no pain with calf compression, swelling, warmth, erythema.   Neruologic: Grossly intact via light touch bilateral.   Musculoskeletal: Decreased first immediate range of motion.  Mild diffuse tenderness to the foot without any significant edema or erythema.  There is also tenderness palpation on the plantar medial tubercle of the calcaneus at the insertion of plantar fascia.  There is no pain with lateral compression of calcaneus.  No pain Achilles tendon.  Muscular strength 5/5 in all groups tested bilateral.   Assessment:   Plantar heel pain,  plantar fasciitis, symptomatic onychomycosis, concern for PAD    Plan:  -Treatment options discussed including all alternatives, risks, and complications -Etiology of symptoms were discussed -X-rays were obtained and reviewed with the patient.  Arthritic changes present the first MPJ.  No evidence of acute fracture. -ABI was performed in the office.  Left was 1.05 in the right was read as "PAD".  Order arterial studies -Sharply debrided the nails x10 without any complications or bleeding -We discussed general stretching exercises for plantar fasciitis as well as shoes with good arch support.  We will hold off on injections today given concern for possible PAD -Discussed daily foot inspection  30 minutes were spent the patient greater than 50% of the time was in face-to-face contact.  Trula Slade DPM

## 2021-04-26 NOTE — Patient Instructions (Signed)
Plantar Fasciitis (Heel Spur Syndrome) with Rehab The plantar fascia is a fibrous, ligament-like, soft-tissue structure that spans the bottom of the foot. Plantar fasciitis is a condition that causes pain in the foot due to inflammation of the tissue. SYMPTOMS   Pain and tenderness on the underneath side of the foot.  Pain that worsens with standing or walking. CAUSES  Plantar fasciitis is caused by irritation and injury to the plantar fascia on the underneath side of the foot. Common mechanisms of injury include:  Direct trauma to bottom of the foot.  Damage to a small nerve that runs under the foot where the main fascia attaches to the heel bone.  Stress placed on the plantar fascia due to bone spurs. RISK INCREASES WITH:   Activities that place stress on the plantar fascia (running, jumping, pivoting, or cutting).  Poor strength and flexibility.  Improperly fitted shoes.  Tight calf muscles.  Flat feet.  Failure to warm-up properly before activity.  Obesity. PREVENTION  Warm up and stretch properly before activity.  Allow for adequate recovery between workouts.  Maintain physical fitness:  Strength, flexibility, and endurance.  Cardiovascular fitness.  Maintain a health body weight.  Avoid stress on the plantar fascia.  Wear properly fitted shoes, including arch supports for individuals who have flat feet.  PROGNOSIS  If treated properly, then the symptoms of plantar fasciitis usually resolve without surgery. However, occasionally surgery is necessary.  RELATED COMPLICATIONS   Recurrent symptoms that may result in a chronic condition.  Problems of the lower back that are caused by compensating for the injury, such as limping.  Pain or weakness of the foot during push-off following surgery.  Chronic inflammation, scarring, and partial or complete fascia tear, occurring more often from repeated injections.  TREATMENT  Treatment initially involves the  use of ice and medication to help reduce pain and inflammation. The use of strengthening and stretching exercises may help reduce pain with activity, especially stretches of the Achilles tendon. These exercises may be performed at home or with a therapist. Your caregiver may recommend that you use heel cups of arch supports to help reduce stress on the plantar fascia. Occasionally, corticosteroid injections are given to reduce inflammation. If symptoms persist for greater than 6 months despite non-surgical (conservative), then surgery may be recommended.   MEDICATION   If pain medication is necessary, then nonsteroidal anti-inflammatory medications, such as aspirin and ibuprofen, or other minor pain relievers, such as acetaminophen, are often recommended.  Do not take pain medication within 7 days before surgery.  Prescription pain relievers may be given if deemed necessary by your caregiver. Use only as directed and only as much as you need.  Corticosteroid injections may be given by your caregiver. These injections should be reserved for the most serious cases, because they may only be given a certain number of times.  HEAT AND COLD  Cold treatment (icing) relieves pain and reduces inflammation. Cold treatment should be applied for 10 to 15 minutes every 2 to 3 hours for inflammation and pain and immediately after any activity that aggravates your symptoms. Use ice packs or massage the area with a piece of ice (ice massage).  Heat treatment may be used prior to performing the stretching and strengthening activities prescribed by your caregiver, physical therapist, or athletic trainer. Use a heat pack or soak the injury in warm water.  SEEK IMMEDIATE MEDICAL CARE IF:  Treatment seems to offer no benefit, or the condition worsens.  Any medications   produce adverse side effects.  EXERCISES- RANGE OF MOTION (ROM) AND STRETCHING EXERCISES - Plantar Fasciitis (Heel Spur Syndrome) These exercises  may help you when beginning to rehabilitate your injury. Your symptoms may resolve with or without further involvement from your physician, physical therapist or athletic trainer. While completing these exercises, remember:   Restoring tissue flexibility helps normal motion to return to the joints. This allows healthier, less painful movement and activity.  An effective stretch should be held for at least 30 seconds.  A stretch should never be painful. You should only feel a gentle lengthening or release in the stretched tissue.  RANGE OF MOTION - Toe Extension, Flexion  Sit with your right / left leg crossed over your opposite knee.  Grasp your toes and gently pull them back toward the top of your foot. You should feel a stretch on the bottom of your toes and/or foot.  Hold this stretch for 10 seconds.  Now, gently pull your toes toward the bottom of your foot. You should feel a stretch on the top of your toes and or foot.  Hold this stretch for 10 seconds. Repeat  times. Complete this stretch 3 times per day.   RANGE OF MOTION - Ankle Dorsiflexion, Active Assisted  Remove shoes and sit on a chair that is preferably not on a carpeted surface.  Place right / left foot under knee. Extend your opposite leg for support.  Keeping your heel down, slide your right / left foot back toward the chair until you feel a stretch at your ankle or calf. If you do not feel a stretch, slide your bottom forward to the edge of the chair, while still keeping your heel down.  Hold this stretch for 10 seconds. Repeat 3 times. Complete this stretch 2 times per day.   STRETCH  Gastroc, Standing  Place hands on wall.  Extend right / left leg, keeping the front knee somewhat bent.  Slightly point your toes inward on your back foot.  Keeping your right / left heel on the floor and your knee straight, shift your weight toward the wall, not allowing your back to arch.  You should feel a gentle stretch  in the right / left calf. Hold this position for 10 seconds. Repeat 3 times. Complete this stretch 2 times per day.  STRETCH  Soleus, Standing  Place hands on wall.  Extend right / left leg, keeping the other knee somewhat bent.  Slightly point your toes inward on your back foot.  Keep your right / left heel on the floor, bend your back knee, and slightly shift your weight over the back leg so that you feel a gentle stretch deep in your back calf.  Hold this position for 10 seconds. Repeat 3 times. Complete this stretch 2 times per day.  STRETCH  Gastrocsoleus, Standing  Note: This exercise can place a lot of stress on your foot and ankle. Please complete this exercise only if specifically instructed by your caregiver.   Place the ball of your right / left foot on a step, keeping your other foot firmly on the same step.  Hold on to the wall or a rail for balance.  Slowly lift your other foot, allowing your body weight to press your heel down over the edge of the step.  You should feel a stretch in your right / left calf.  Hold this position for 10 seconds.  Repeat this exercise with a slight bend in your right /   left knee. Repeat 3 times. Complete this stretch 2 times per day.   STRENGTHENING EXERCISES - Plantar Fasciitis (Heel Spur Syndrome)  These exercises may help you when beginning to rehabilitate your injury. They may resolve your symptoms with or without further involvement from your physician, physical therapist or athletic trainer. While completing these exercises, remember:   Muscles can gain both the endurance and the strength needed for everyday activities through controlled exercises.  Complete these exercises as instructed by your physician, physical therapist or athletic trainer. Progress the resistance and repetitions only as guided.  STRENGTH - Towel Curls  Sit in a chair positioned on a non-carpeted surface.  Place your foot on a towel, keeping your heel  on the floor.  Pull the towel toward your heel by only curling your toes. Keep your heel on the floor. Repeat 3 times. Complete this exercise 2 times per day.  STRENGTH - Ankle Inversion  Secure one end of a rubber exercise band/tubing to a fixed object (table, pole). Loop the other end around your foot just before your toes.  Place your fists between your knees. This will focus your strengthening at your ankle.  Slowly, pull your big toe up and in, making sure the band/tubing is positioned to resist the entire motion.  Hold this position for 10 seconds.  Have your muscles resist the band/tubing as it slowly pulls your foot back to the starting position. Repeat 3 times. Complete this exercises 2 times per day.  Document Released: 12/12/2005 Document Revised: 03/05/2012 Document Reviewed: 03/26/2009   Diabetes Mellitus and Foot Care Foot care is an important part of your health, especially when you have diabetes. Diabetes may cause you to have problems because of poor blood flow (circulation) to your feet and legs, which can cause your skin to:  Become thinner and drier.  Break more easily.  Heal more slowly.  Peel and crack. You may also have nerve damage (neuropathy) in your legs and feet, causing decreased feeling in them. This means that you may not notice minor injuries to your feet that could lead to more serious problems. Noticing and addressing any potential problems early is the best way to prevent future foot problems. How to care for your feet Foot hygiene  Wash your feet daily with warm water and mild soap. Do not use hot water. Then, pat your feet and the areas between your toes until they are completely dry. Do not soak your feet as this can dry your skin.  Trim your toenails straight across. Do not dig under them or around the cuticle. File the edges of your nails with an emery board or nail file.  Apply a moisturizing lotion or petroleum jelly to the skin on your  feet and to dry, brittle toenails. Use lotion that does not contain alcohol and is unscented. Do not apply lotion between your toes.   Shoes and socks  Wear clean socks or stockings every day. Make sure they are not too tight. Do not wear knee-high stockings since they may decrease blood flow to your legs.  Wear shoes that fit properly and have enough cushioning. Always look in your shoes before you put them on to be sure there are no objects inside.  To break in new shoes, wear them for just a few hours a day. This prevents injuries on your feet. Wounds, scrapes, corns, and calluses  Check your feet daily for blisters, cuts, bruises, sores, and redness. If you cannot see the bottom  of your feet, use a mirror or ask someone for help.  Do not cut corns or calluses or try to remove them with medicine.  If you find a minor scrape, cut, or break in the skin on your feet, keep it and the skin around it clean and dry. You may clean these areas with mild soap and water. Do not clean the area with peroxide, alcohol, or iodine.  If you have a wound, scrape, corn, or callus on your foot, look at it several times a day to make sure it is healing and not infected. Check for: ? Redness, swelling, or pain. ? Fluid or blood. ? Warmth. ? Pus or a bad smell.   General tips  Do not cross your legs. This may decrease blood flow to your feet.  Do not use heating pads or hot water bottles on your feet. They may burn your skin. If you have lost feeling in your feet or legs, you may not know this is happening until it is too late.  Protect your feet from hot and cold by wearing shoes, such as at the beach or on hot pavement.  Schedule a complete foot exam at least once a year (annually) or more often if you have foot problems. Report any cuts, sores, or bruises to your health care provider immediately. Where to find more information  American Diabetes Association: www.diabetes.org  Association of Diabetes  Care & Education Specialists: www.diabeteseducator.org Contact a health care provider if:  You have a medical condition that increases your risk of infection and you have any cuts, sores, or bruises on your feet.  You have an injury that is not healing.  You have redness on your legs or feet.  You feel burning or tingling in your legs or feet.  You have pain or cramps in your legs and feet.  Your legs or feet are numb.  Your feet always feel cold.  You have pain around any toenails. Get help right away if:  You have a wound, scrape, corn, or callus on your foot and: ? You have pain, swelling, or redness that gets worse. ? You have fluid or blood coming from the wound, scrape, corn, or callus. ? Your wound, scrape, corn, or callus feels warm to the touch. ? You have pus or a bad smell coming from the wound, scrape, corn, or callus. ? You have a fever. ? You have a red line going up your leg. Summary  Check your feet every day for blisters, cuts, bruises, sores, and redness.  Apply a moisturizing lotion or petroleum jelly to the skin on your feet and to dry, brittle toenails.  Wear shoes that fit properly and have enough cushioning.  If you have foot problems, report any cuts, sores, or bruises to your health care provider immediately.  Schedule a complete foot exam at least once a year (annually) or more often if you have foot problems. This information is not intended to replace advice given to you by your health care provider. Make sure you discuss any questions you have with your health care provider. Document Revised: 07/02/2020 Document Reviewed: 07/02/2020 Elsevier Patient Education  2021 [place] Patient Information 2014 [place], [place].

## 2021-04-28 ENCOUNTER — Encounter: Payer: Self-pay | Admitting: Podiatry

## 2021-04-28 NOTE — Addendum Note (Signed)
Addended by: Cranford Mon R on: 04/28/2021 07:37 AM   Modules accepted: Orders

## 2021-05-07 ENCOUNTER — Ambulatory Visit (HOSPITAL_COMMUNITY): Admission: RE | Admit: 2021-05-07 | Payer: Medicare Other | Source: Ambulatory Visit

## 2021-06-16 ENCOUNTER — Other Ambulatory Visit: Payer: Self-pay | Admitting: Registered Nurse

## 2021-06-16 DIAGNOSIS — Z1231 Encounter for screening mammogram for malignant neoplasm of breast: Secondary | ICD-10-CM

## 2021-06-29 ENCOUNTER — Ambulatory Visit: Payer: Medicare Other | Admitting: Podiatry

## 2021-07-22 ENCOUNTER — Ambulatory Visit: Payer: Medicare Other | Admitting: Podiatry

## 2022-01-21 ENCOUNTER — Ambulatory Visit
Admission: RE | Admit: 2022-01-21 | Discharge: 2022-01-21 | Disposition: A | Discharge status: Home / Self Care | Payer: Medicare Other | Source: Ambulatory Visit | Attending: Physician Assistant | Admitting: Physician Assistant

## 2022-01-21 ENCOUNTER — Other Ambulatory Visit: Payer: Self-pay

## 2022-01-21 ENCOUNTER — Other Ambulatory Visit: Payer: Self-pay | Admitting: Physician Assistant

## 2022-01-21 DIAGNOSIS — M545 Low back pain, unspecified: Secondary | ICD-10-CM

## 2022-01-21 DIAGNOSIS — M25551 Pain in right hip: Secondary | ICD-10-CM

## 2022-03-31 ENCOUNTER — Other Ambulatory Visit: Payer: Self-pay | Admitting: Registered Nurse

## 2022-03-31 DIAGNOSIS — E2839 Other primary ovarian failure: Secondary | ICD-10-CM

## 2022-03-31 DIAGNOSIS — Z780 Asymptomatic menopausal state: Secondary | ICD-10-CM

## 2022-09-22 ENCOUNTER — Other Ambulatory Visit: Payer: Medicare Other

## 2022-09-26 ENCOUNTER — Inpatient Hospital Stay: Admission: RE | Admit: 2022-09-26 | Payer: Medicare Other | Source: Ambulatory Visit

## 2022-09-27 ENCOUNTER — Inpatient Hospital Stay: Admission: RE | Admit: 2022-09-27 | Payer: Medicare Other | Source: Ambulatory Visit

## 2022-10-13 ENCOUNTER — Other Ambulatory Visit: Payer: Self-pay | Admitting: Registered Nurse

## 2022-10-13 DIAGNOSIS — E2839 Other primary ovarian failure: Secondary | ICD-10-CM

## 2022-10-13 DIAGNOSIS — Z780 Asymptomatic menopausal state: Secondary | ICD-10-CM

## 2022-11-10 ENCOUNTER — Other Ambulatory Visit: Payer: Medicare Other

## 2022-11-23 ENCOUNTER — Other Ambulatory Visit: Payer: Self-pay | Admitting: *Deleted

## 2022-11-23 NOTE — Progress Notes (Signed)
Incontinence supply order and documentation faxed to [facility].

## 2022-12-29 ENCOUNTER — Other Ambulatory Visit: Payer: Medicare Other

## 2023-11-10 ENCOUNTER — Other Ambulatory Visit: Payer: Self-pay

## 2023-11-10 ENCOUNTER — Ambulatory Visit: Payer: 59

## 2023-11-10 ENCOUNTER — Ambulatory Visit
Admission: EM | Admit: 2023-11-10 | Discharge: 2023-11-10 | Disposition: A | Discharge status: Home / Self Care | Payer: 59 | Attending: Family Medicine | Admitting: Family Medicine

## 2023-11-10 DIAGNOSIS — M25572 Pain in left ankle and joints of left foot: Secondary | ICD-10-CM | POA: Diagnosis not present

## 2023-11-10 DIAGNOSIS — L03115 Cellulitis of right lower limb: Secondary | ICD-10-CM | POA: Diagnosis not present

## 2023-11-10 MED ORDER — CLINDAMYCIN HCL 150 MG PO CAPS
150.0000 mg | ORAL_CAPSULE | Freq: Three times a day (TID) | ORAL | 0 refills | Status: AC
Start: 2023-11-10 — End: 2023-11-17

## 2023-11-10 NOTE — Discharge Instructions (Signed)
You were seen today for various issues.  I have sent out an antibiotic for the infection to the right leg.  Your xray does not show anything concerning, but if the radiologist sees something different we will call to notify you.  In the mean time I recommend tylenol for pain, you may rest and ice the foot/ankle. Please follow up with your podiatrist (foot doctor) if not improving.

## 2023-11-10 NOTE — ED Triage Notes (Signed)
Patient presents with redness, swollen on right lower leg x 3 weeks. Pt states she have been treating with alcohol, Vaseline, flour dressing and cocoa butter. Patient states area is painful.

## 2023-11-10 NOTE — ED Provider Notes (Signed)
[facility]    CSN: [id number] Arrival date & time: 11/10/23  1158      History   Chief Complaint No chief complaint on file.   HPI [name] is a 82 y.o. female.   Patient is here for right lower leg redness/rash/swelling.  She first noted it 3 weeks ago.  It was just an area that was itchy, and slight burning, and had a large blister.   The blister did open and drain.  She covered it, and used otc topicals.   It it still swollen, although the swelling is better.  It is still painful.  No fevers/chills.   Painful with walking.  She has a cane to use outside the home, and needing it for in the home to get around.   She also fell yesterday while at home, thinks she tripped over a grocery bag.  Having pain at the left ankle/foot;  pain to bear weight and move the foot.  Some swelling and bruising.        Past Medical History:  Diagnosis Date   Acute MI (HCC)    Arthritis    Asthma    Atrial fibrillation (HCC)    Diabetes mellitus    DVT (deep venous thrombosis) (HCC)    Gout    Gout    Stroke [facility])     Patient Active Problem List   Diagnosis Date Noted   Abdominal pain 08/07/2019   Pain in left knee 06/10/2019   Chronic pain syndrome 08/04/2015   Morbid obesity (HCC) 04/07/2015   Osteoarthritis 04/07/2015   Polypharmacy 02/13/2015   Atrial fibrillation (HCC) 11/06/2014   Diabetes mellitus type 2, controlled, without complications (HCC) 11/06/2014   CAD (coronary artery disease), native coronary artery 11/06/2014   Essential hypertension 11/06/2014   Asthma in adult without complication 11/06/2014   Arthritis 04/07/2014    Past Surgical History:  Procedure Laterality Date   BACK SURGERY Left 2003   left knee replacement and left shoulder   BREAST EXCISIONAL BIOPSY Left 1983   FOOT SURGERY     JOINT REPLACEMENT     SHOULDER SURGERY      OB History   No obstetric history on file.      Home Medications    Prior to  Admission medications   Medication Sig Start Date End Date Taking? Authorizing Provider  albuterol (VENTOLIN HFA) 108 (90 Base) MCG/ACT inhaler Inhale 2 puffs into the lungs every 6 (six) hours as needed for wheezing or shortness of breath (SOB).    [provider]  allopurinol (ZYLOPRIM) 100 MG tablet TAKE 1 TABLET (100 MG TOTAL) BY MOUTH DAILY. Patient not taking: Reported on 03/29/2019 11/15/17   Myrlene Broker, MD  apixaban (ELIQUIS) 5 MG TABS tablet Take 1 tablet (5 mg total) by mouth 2 (two) times daily. 02/26/18   Myrlene Broker, MD  atenolol (TENORMIN) 50 MG tablet Take 1 tablet (50 mg total) by mouth daily. Patient not taking: Reported on 03/29/2019 01/18/17   Myrlene Broker, MD  cadexomer iodine (IODOSORB) 0.9 % gel Apply 1 application topically daily as needed for wound care. Patient not taking: Reported on 09/07/2017 01/18/17   Kerrin Champagne, MD  clotrimazole (LOTRIMIN) 1 % cream Apply 1 application topically 2 (two) times daily. Patient not taking: Reported on 09/07/2017 03/14/17   Brock Bad, MD  colchicine 0.6 MG tablet Take 0.6 mg by mouth 2 (two) times daily. 04/05/21   [provider]  diclofenac Sodium (VOLTAREN) 1 % GEL Voltaren 1 % topical gel  APPLY 2 GRAMS TO THE AFFECTED AREA(S) BY TOPICAL ROUTE 4 TIMES PER DAY    [provider]  DULoxetine (CYMBALTA) 60 MG capsule TAKE 1 CAPSULE (60 MG TOTAL) BY MOUTH DAILY. Patient not taking: Reported on 03/29/2019 11/15/17   Myrlene Broker, MD  Fluticasone-Salmeterol (ADVAIR) 100-50 MCG/DOSE AEPB Inhale 1 puff into the lungs 2 (two) times daily.    [provider]  furosemide (LASIX) 40 MG tablet Take 1 tablet (40 mg total) by mouth daily as needed (leg swelling). Keep 9/5/ appointment for further refills 08/23/17   Myrlene Broker, MD  gabapentin (NEURONTIN) 100 MG capsule TAKE ONE CAPSULE BY MOUTH AT BEDTIME Patient not taking: Reported on 03/29/2019 07/24/17   Kerrin Champagne, MD  HYDROcodone-acetaminophen (NORCO/VICODIN) 5-325 MG tablet Take 1-2 tablets by mouth every 6 (six) hours as needed for moderate pain. Patient not taking: Reported on 08/07/2019 03/29/19   Brock Bad, MD  Lactobacillus [place]) CAPS Take by mouth. Patient not taking: Reported on 12/15/2020    [provider]  lisinopril (PRINIVIL,ZESTRIL) 10 MG tablet Take 1 tablet (10 mg total) by mouth daily. Patient not taking: Reported on 03/29/2019 01/18/17   Myrlene Broker, MD  loratadine (CLARITIN) 10 MG tablet TAKE 1 TABLET (10 MG TOTAL) BY MOUTH DAILY AS NEEDED FOR ALLERGIES. Patient not taking: Reported on 03/29/2019 01/06/17   Myrlene Broker, MD  losartan (COZAAR) 25 MG tablet Take 25 mg by mouth daily. 02/03/21   [provider]  losartan (COZAAR) 50 MG tablet Take 50 mg by mouth daily.    [provider]  metFORMIN (GLUCOPHAGE) 500 MG tablet TAKE 1 TABLET (500 MG TOTAL) BY MOUTH DAILY WITH BREAKFAST. 11/15/17   Myrlene Broker, MD  metoprolol succinate (TOPROL-XL) 50 MG 24 hr tablet metoprolol succinate ER 50 mg tablet,extended release 24 hr    [provider]  metoprolol tartrate (LOPRESSOR) 50 MG tablet Take 50 mg by mouth 2 (two) times daily.    [provider]  omeprazole (PRILOSEC) 20 MG capsule Take 1 capsule (20 mg total) by mouth daily. Keep 9/5/ appointment for further refills Patient not taking: No sig reported 08/23/17   Myrlene Broker, MD  oxyCODONE-acetaminophen (PERCOCET/ROXICET) 5-325 MG tablet Take 1 tablet by mouth 3 (three) times daily as needed. 04/23/21   [provider]  potassium chloride (KLOR-CON) 20 MEQ packet Take by mouth 2 (two) times daily. Patient not taking: Reported on 12/15/2020    [provider]  potassium chloride SA (KLOR-CON) 20 MEQ tablet potassium chloride ER 20 mEq tablet,extended release(part/cryst)    [provider]  predniSONE (DELTASONE) 5 MG tablet Take 5  mg by mouth daily with breakfast. Patient not taking: Reported on 12/15/2020    [provider]  simvastatin (ZOCOR) 20 MG tablet Take 1 tablet (20 mg total) by mouth daily. Keep 9/5 appointment for further refills Patient not taking: Reported on 03/29/2019 08/23/17   Myrlene Broker, MD  terbinafine (LAMISIL) 250 MG tablet Take 250 mg by mouth daily.    [provider]    Family History Family History  Problem Relation Age of Onset   Cancer Brother    Cancer Mother    Breast cancer Mother 34    Social History Social History   Tobacco Use   Smoking status: Former    Current packs/day: 0.25    Average packs/day:  0.3 packs/day for 30.0 years (7.5 ttl pk-yrs)    Types: Cigarettes   Smokeless tobacco: Former    Quit date: 06/13/2004   Tobacco comments:    Quit when she went to nursing home/ states she light smoker; Only smoked periodically; Pack years undetermined  Substance Use Topics   Alcohol use: Yes    Alcohol/week: 1.0 standard drink of alcohol    Types: 1 Standard drinks or equivalent per week    Comment: Occ.   Drug use: Not Currently    Types: Marijuana    Comment: occasionally     Allergies   Penicillins   Review of Systems Review of Systems  Constitutional: Negative.   HENT: Negative.    Respiratory: Negative.    Cardiovascular: Negative.   Gastrointestinal: Negative.   Musculoskeletal:  Positive for joint swelling.  Skin:  Positive for wound.     Physical Exam Triage Vital Signs ED Triage Vitals  Encounter Vitals Group     BP 11/10/23 1242 (!) 157/102     Systolic BP Percentile --      Diastolic BP Percentile --      Pulse Rate 11/10/23 1242 89     Resp --      Temp 11/10/23 1242 98.5 F (36.9 C)     Temp Source 11/10/23 1242 Oral     SpO2 11/10/23 1242 99 %     Weight 11/10/23 1237 148 lb 12.8 oz (67.5 kg)     Height 11/10/23 1237 5\' 4"  (1.626 m)     Head Circumference --      Peak Flow --      Pain Score 11/10/23  1237 8     Pain Loc --      Pain Education --      Exclude from Growth Chart --    No data found.  Updated Vital Signs BP (!) 145/79 (BP Location: Left Arm)   Pulse 89   Temp 98.5 F (36.9 C) (Oral)   Ht 5\' 4"  (1.626 m)   Wt 67.5 kg   SpO2 99%   BMI 25.54 kg/m   Visual Acuity Right Eye Distance:   Left Eye Distance:   Bilateral Distance:    Right Eye Near:   Left Eye Near:    Bilateral Near:     Physical Exam Constitutional:      Appearance: Normal appearance.  Cardiovascular:     Rate and Rhythm: Normal rate and regular rhythm.  Pulmonary:     Effort: Pulmonary effort is normal.     Breath sounds: Normal breath sounds.  Musculoskeletal:     Comments: She has TTP to the left lateral and medial ankle;  very TTP to the left foot in general.  Bruising noted to the top of the left foot;  decreased rom due to pain.   Skin:    Comments: There is slight swelling to the right LE;  there is a well defined circular area of redness/old blister to the lateral aspect of the right leg, about 5cm in diameter.  No fluid collection noted;  the area is tender;  no streaking of redness up the leg  Neurological:     General: No focal deficit present.     Mental Status: She is alert.  Psychiatric:        Mood and Affect: Mood normal.      UC Treatments / Results  Labs (all labs ordered are listed, but only abnormal results are displayed) Labs Reviewed -  No data to display  EKG   Radiology  Procedures Procedures (including critical care time)  Medications Ordered in UC Medications - No data to display  Initial Impression / Assessment and Plan / UC Course  I have reviewed the triage vital signs and the nursing notes.  Pertinent labs & imaging results that were available during my care of the patient were reviewed by me and considered in my medical decision making (see chart for details).  Patient seen today for various issues.  I have sent out an antibiotic for  infection of the right leg.  She also fell yesterday, and having pain at the left foot/ankle.  She is diffusely tender, but able to ambulate.  Xray shows possible abnormality to the cuboid bone? But patient is not particular tender at this spot.  I do not see any other abnormality. Discussed that once the radiologist reads this xray, if there is anything noted we will call and notify her.   Final Clinical Impressions(s) / UC Diagnoses   Final diagnoses:  Cellulitis of right lower extremity  Pain of joint of left ankle and foot     Discharge Instructions      You were seen today for various issues.  I have sent out an antibiotic for the infection to the right leg.  Your xray does not show anything concerning, but if the radiologist sees something different we will call to notify you.  In the mean time I recommend tylenol for pain, you may rest and ice the foot/ankle. Please follow up with your podiatrist (foot doctor) if not improving.     ED Prescriptions     Medication Sig Dispense Auth. Provider   clindamycin (CLEOCIN) 150 MG capsule Take 1 capsule (150 mg total) by mouth 3 (three) times daily for 7 days. 21 capsule Jannifer Franklin, MD      PDMP not reviewed this encounter.   Jannifer Franklin, MD 11/10/23 1351

## 2024-07-23 ENCOUNTER — Other Ambulatory Visit: Payer: Self-pay

## 2024-07-23 ENCOUNTER — Emergency Department (HOSPITAL_COMMUNITY)

## 2024-07-23 ENCOUNTER — Emergency Department (HOSPITAL_COMMUNITY)
Admission: EM | Admit: 2024-07-23 | Discharge: 2024-07-24 | Disposition: A | Discharge status: Home / Self Care | Attending: Emergency Medicine | Admitting: Emergency Medicine

## 2024-07-23 DIAGNOSIS — Z23 Encounter for immunization: Secondary | ICD-10-CM | POA: Diagnosis not present

## 2024-07-23 DIAGNOSIS — W108XXA Fall (on) (from) other stairs and steps, initial encounter: Secondary | ICD-10-CM | POA: Diagnosis not present

## 2024-07-23 DIAGNOSIS — Z79899 Other long term (current) drug therapy: Secondary | ICD-10-CM | POA: Insufficient documentation

## 2024-07-23 DIAGNOSIS — I10 Essential (primary) hypertension: Secondary | ICD-10-CM | POA: Diagnosis not present

## 2024-07-23 DIAGNOSIS — I2510 Atherosclerotic heart disease of native coronary artery without angina pectoris: Secondary | ICD-10-CM | POA: Diagnosis not present

## 2024-07-23 DIAGNOSIS — E119 Type 2 diabetes mellitus without complications: Secondary | ICD-10-CM | POA: Diagnosis not present

## 2024-07-23 DIAGNOSIS — R1030 Lower abdominal pain, unspecified: Secondary | ICD-10-CM

## 2024-07-23 DIAGNOSIS — Z7984 Long term (current) use of oral hypoglycemic drugs: Secondary | ICD-10-CM | POA: Insufficient documentation

## 2024-07-23 DIAGNOSIS — W19XXXA Unspecified fall, initial encounter: Secondary | ICD-10-CM

## 2024-07-23 DIAGNOSIS — S0083XA Contusion of other part of head, initial encounter: Secondary | ICD-10-CM | POA: Insufficient documentation

## 2024-07-23 DIAGNOSIS — Z7901 Long term (current) use of anticoagulants: Secondary | ICD-10-CM | POA: Diagnosis not present

## 2024-07-23 LAB — CBC WITH DIFFERENTIAL/PLATELET
Abs Immature Granulocytes: 0.01 K/uL (ref 0.00–0.07)
Basophils Absolute: 0 K/uL (ref 0.0–0.1)
Basophils Relative: 1 %
Eosinophils Absolute: 0.1 K/uL (ref 0.0–0.5)
Eosinophils Relative: 2 %
HCT: 40.3 % (ref 36.0–46.0)
Hemoglobin: 12.3 g/dL (ref 12.0–15.0)
Immature Granulocytes: 0 %
Lymphocytes Relative: 20 %
Lymphs Abs: 0.8 K/uL (ref 0.7–4.0)
MCH: 27.6 pg (ref 26.0–34.0)
MCHC: 30.5 g/dL (ref 30.0–36.0)
MCV: 90.4 fL (ref 80.0–100.0)
Monocytes Absolute: 0.6 K/uL (ref 0.1–1.0)
Monocytes Relative: 13 %
Neutro Abs: 2.6 K/uL (ref 1.7–7.7)
Neutrophils Relative %: 64 %
Platelets: 129 K/uL — ABNORMAL LOW (ref 150–400)
RBC: 4.46 MIL/uL (ref 3.87–5.11)
RDW: 14.8 % (ref 11.5–15.5)
WBC: 4.1 K/uL (ref 4.0–10.5)
nRBC: 0 % (ref 0.0–0.2)

## 2024-07-23 LAB — BASIC METABOLIC PANEL WITH GFR
Anion gap: 8 (ref 5–15)
BUN: 10 mg/dL (ref 8–23)
CO2: 24 mmol/L (ref 22–32)
Calcium: 9 mg/dL (ref 8.9–10.3)
Chloride: 110 mmol/L (ref 98–111)
Creatinine, Ser: 0.7 mg/dL (ref 0.44–1.00)
GFR, Estimated: >60 mL/min (ref >60)
Glucose, Bld: 89 mg/dL (ref 70–99)
Potassium: 3.8 mmol/L (ref 3.5–5.1)
Sodium: 142 mmol/L (ref 135–145)

## 2024-07-23 MED ORDER — ACETAMINOPHEN 500 MG PO TABS: 500.0000 mg | ORAL_TABLET | Freq: Four times a day (QID) | ORAL | 0 refills | Status: DC | PRN

## 2024-07-23 MED ORDER — HYDROCODONE-ACETAMINOPHEN 5-325 MG PO TABS
1.0000 | ORAL_TABLET | Freq: Four times a day (QID) | ORAL | 0 refills | Status: DC | PRN
Start: 2024-07-23 — End: 2024-07-23

## 2024-07-23 MED ORDER — TETANUS-DIPHTH-ACELL PERTUSSIS 5-2.5-18.5 LF-MCG/0.5 IM SUSY
0.5000 mL | PREFILLED_SYRINGE | Freq: Once | INTRAMUSCULAR | Status: AC
  Administered 2024-07-23: 0.5 mL via INTRAMUSCULAR
  Filled 2024-07-23: qty 0.5

## 2024-07-23 MED ORDER — ACETAMINOPHEN 500 MG PO TABS
1000.0000 mg | ORAL_TABLET | Freq: Once | ORAL | Status: AC
Start: 2024-07-23 — End: 2024-07-23
  Administered 2024-07-23: 1000 mg via ORAL
  Filled 2024-07-23: qty 2

## 2024-07-23 MED ORDER — ACETAMINOPHEN 500 MG PO TABS
500.0000 mg | ORAL_TABLET | Freq: Four times a day (QID) | ORAL | 0 refills | Status: AC | PRN
Start: 2024-07-23 — End: ?

## 2024-07-23 MED ORDER — HYDROCODONE-ACETAMINOPHEN 5-325 MG PO TABS: 1.0000 | ORAL_TABLET | Freq: Three times a day (TID) | ORAL | 0 refills | Status: AC | PRN

## 2024-07-23 NOTE — ED Triage Notes (Signed)
 Pt BIB EMS from home due to a mechanical fall. Pt states she tripped over the steps w/ her cane. Hematoma to the rt forehead, pt reporting rt arm and hip pain. No LOC. GCS 15. Denies blood thinners.

## 2024-07-23 NOTE — Discharge Instructions (Addendum)
 You have been evaluated for your fall.  Fortunately CT scan did not show any concerning finding.  You do have a bruise on your forehead.  Please apply over-the-counter Vaseline to the wound to help with healing.  You may take Tylenol  as needed for pain and you may take Vicodin if the pain is intense but do not take both of them at the same time.  Follow-up with your doctor for further care.  Return if you have any concern.

## 2024-07-23 NOTE — ED Provider Notes (Signed)
 [facility] AT [facility] Provider Note   CSN: [id number] Arrival date & time: 07/23/24  8042     Patient presents with: Felton Naomie CROME Cylinda Santoli is a 83 y.o. female.   The history is provided by the patient, medical records and the EMS personnel. No language interpreter was used.  Fall     83 year old female history of atrial fibrillation currently on Eliquis , diabetes, CAD, hypertension, polypharmacy, prior stroke brought here via EMS from home for evaluation of fall.  Patient states she lives at home by herself, she was outside trying to feed the birds this afternoon but states it was really hot and while she was turning around to walk back to her house she tripped on a step and fell down striking her head against the ground.  She denies any loss of consciousness she was able to call for help.  She is currently complaining of pain to her forehead, her right side of her body and her right ankle from the fall.  Although she has Eliquis  listed on her medication she states she does not take it.  She denies any recent medication change she denies any precipitating symptoms prior to the fall except which is really hot outside.  She denies any new numbness or new weakness denies any nausea or vomiting.  Prior to Admission medications   Medication Sig Start Date End Date Taking? Authorizing Provider  albuterol  (VENTOLIN  HFA) 108 (90 Base) MCG/ACT inhaler Inhale 2 puffs into the lungs every 6 (six) hours as needed for wheezing or shortness of breath (SOB).    [provider]  allopurinol  (ZYLOPRIM ) 100 MG tablet TAKE 1 TABLET (100 MG TOTAL) BY MOUTH DAILY. Patient not taking: Reported on 03/29/2019 11/15/17   Rollene Almarie LABOR, MD  apixaban  (ELIQUIS ) 5 MG TABS tablet Take 1 tablet (5 mg total) by mouth 2 (two) times daily. 02/26/18   Rollene Almarie LABOR, MD  atenolol  (TENORMIN ) 50 MG tablet Take 1 tablet (50 mg total) by mouth daily. Patient not  taking: Reported on 03/29/2019 01/18/17   Rollene Almarie LABOR, MD  cadexomer iodine  (IODOSORB) 0.9 % gel Apply 1 application topically daily as needed for wound care. Patient not taking: Reported on 09/07/2017 01/18/17   Nitka, James E, MD  clotrimazole  (LOTRIMIN ) 1 % cream Apply 1 application topically 2 (two) times daily. Patient not taking: Reported on 09/07/2017 03/14/17   Rudy Carlin LABOR, MD  colchicine 0.6 MG tablet Take 0.6 mg by mouth 2 (two) times daily. 04/05/21   [provider]  diclofenac Sodium (VOLTAREN) 1 % GEL Voltaren 1 % topical gel  APPLY 2 GRAMS TO THE AFFECTED AREA(S) BY TOPICAL ROUTE 4 TIMES PER DAY    [provider]  DULoxetine  (CYMBALTA ) 60 MG capsule TAKE 1 CAPSULE (60 MG TOTAL) BY MOUTH DAILY. Patient not taking: Reported on 03/29/2019 11/15/17   Rollene Almarie LABOR, MD  Fluticasone-Salmeterol (ADVAIR) 100-50 MCG/DOSE AEPB Inhale 1 puff into the lungs 2 (two) times daily.    [provider]  furosemide  (LASIX ) 40 MG tablet Take 1 tablet (40 mg total) by mouth daily as needed (leg swelling). Keep 9/5/ appointment for further refills 08/23/17   Rollene Almarie LABOR, MD  gabapentin (NEURONTIN) 100 MG capsule TAKE ONE CAPSULE BY MOUTH AT BEDTIME Patient not taking: Reported on 03/29/2019 07/24/17   Nitka, James E, MD  HYDROcodone -acetaminophen  (NORCO/VICODIN) 5-325 MG tablet Take 1-2 tablets by mouth every 6 (six) hours as needed for  moderate pain. Patient not taking: Reported on 08/07/2019 03/29/19   Rudy Carlin LABOR, MD  Lactobacillus (BIOTINEX) CAPS Take by mouth. Patient not taking: Reported on 12/15/2020    [provider]  lisinopril  (PRINIVIL ,ZESTRIL ) 10 MG tablet Take 1 tablet (10 mg total) by mouth daily. Patient not taking: Reported on 03/29/2019 01/18/17   Rollene Almarie LABOR, MD  loratadine  (CLARITIN ) 10 MG tablet TAKE 1 TABLET (10 MG TOTAL) BY MOUTH DAILY AS NEEDED FOR ALLERGIES. Patient not taking: Reported on 03/29/2019 01/06/17    Rollene Almarie LABOR, MD  losartan (COZAAR) 25 MG tablet Take 25 mg by mouth daily. 02/03/21   [provider]  losartan (COZAAR) 50 MG tablet Take 50 mg by mouth daily.    [provider]  metFORMIN  (GLUCOPHAGE ) 500 MG tablet TAKE 1 TABLET (500 MG TOTAL) BY MOUTH DAILY WITH BREAKFAST. 11/15/17   Rollene Almarie LABOR, MD  metoprolol  succinate (TOPROL -XL) 50 MG 24 hr tablet metoprolol  succinate ER 50 mg tablet,extended release 24 hr    [provider]  metoprolol  tartrate (LOPRESSOR ) 50 MG tablet Take 50 mg by mouth 2 (two) times daily.    [provider]  omeprazole  (PRILOSEC) 20 MG capsule Take 1 capsule (20 mg total) by mouth daily. Keep 9/5/ appointment for further refills Patient not taking: No sig reported 08/23/17   Rollene Almarie LABOR, MD  oxyCODONE -acetaminophen  (PERCOCET/ROXICET) 5-325 MG tablet Take 1 tablet by mouth 3 (three) times daily as needed. 04/23/21   [provider]  potassium chloride  (KLOR-CON ) 20 MEQ packet Take by mouth 2 (two) times daily. Patient not taking: Reported on 12/15/2020    [provider]  potassium chloride  SA (KLOR-CON ) 20 MEQ tablet potassium chloride  ER 20 mEq tablet,extended release(part/cryst)    [provider]  predniSONE (DELTASONE) 5 MG tablet Take 5 mg by mouth daily with breakfast. Patient not taking: Reported on 12/15/2020    [provider]  simvastatin  (ZOCOR ) 20 MG tablet Take 1 tablet (20 mg total) by mouth daily. Keep 9/5 appointment for further refills Patient not taking: Reported on 03/29/2019 08/23/17   Rollene Almarie LABOR, MD  terbinafine (LAMISIL) 250 MG tablet Take 250 mg by mouth daily.    [provider]    Allergies: Penicillins    Review of Systems  All other systems reviewed and are negative.   Updated Vital Signs BP 136/81   Pulse 91   Temp 98.6 F (37 C) (Oral)   Resp 16   Ht 5' 4 (1.626 m)   Wt 64.4 kg   BMI 24.37 kg/m   Physical  Exam Vitals and nursing note reviewed.  Constitutional:      General: She is not in acute distress.    Appearance: She is well-developed.  HENT:     Head: Normocephalic.     Comments: Abrasion and hematoma noted to right forehead with mild tenderness to palpation.  No laceration noted.  No significant midface tenderness no raccoon's eyes or Battle sign no hemotympanum no septal hematoma and no malocclusion Eyes:     Extraocular Movements: Extraocular movements intact.     Conjunctiva/sclera: Conjunctivae normal.     Pupils: Pupils are equal, round, and reactive to light.  Neck:     Comments: Neck with full range of motion and no significant midline spine tenderness Cardiovascular:     Rate and Rhythm: Rhythm irregular.     Pulses: Normal pulses.     Heart sounds: Normal heart sounds.  Pulmonary:  Effort: Pulmonary effort is normal.     Breath sounds: No wheezing, rhonchi or rales.  Chest:     Chest wall: No tenderness.  Abdominal:     Palpations: Abdomen is soft.     Tenderness: There is no abdominal tenderness.  Musculoskeletal:        General: Tenderness (Mild tenderness about the right shoulder with full range of motion and no deformity noted.  Mild tenderness to right elbow with normal range of motion.) present.     Cervical back: Normal range of motion and neck supple.  Skin:    Findings: No rash.  Neurological:     Mental Status: She is alert.  Psychiatric:        Mood and Affect: Mood normal.     (all labs ordered are listed, but only abnormal results are displayed) Labs Reviewed  CBC WITH DIFFERENTIAL/PLATELET - Abnormal; Notable for the following components:      Result Value   Platelets 129 (*)    All other components within normal limits  BASIC METABOLIC PANEL WITH GFR  URINALYSIS, ROUTINE W REFLEX MICROSCOPIC    EKG: None ED ECG REPORT   Date: 07/23/2024  Rate: 84  Rhythm: atrial fibrillation  QRS Axis: normal  Intervals: normal  ST/T Wave  abnormalities: nonspecific T wave changes  Conduction Disutrbances:none  Narrative Interpretation:   Old EKG Reviewed: unchanged  I have personally reviewed the EKG tracing and agree with the computerized printout as noted.   Radiology: CT Head Wo Contrast Result Date: 07/23/2024 CLINICAL DATA:  Trauma, mechanical fall, hematoma to right forehead. EXAM: CT HEAD WITHOUT CONTRAST CT CERVICAL SPINE WITHOUT CONTRAST TECHNIQUE: Multidetector CT imaging of the head and cervical spine was performed following the standard protocol without intravenous contrast. Multiplanar CT image reconstructions of the cervical spine were also generated. RADIATION DOSE REDUCTION: This exam was performed according to the departmental dose-optimization program which includes automated exposure control, adjustment of the mA and/or kV according to patient size and/or use of iterative reconstruction technique. COMPARISON:  CT head 06/05/2014, CT cervical spine 09/04/2012. FINDINGS: CT HEAD FINDINGS Brain: No acute intracranial hemorrhage. No CT evidence of acute large territory infarct. Encephalomalacia involving the right insular cortex as well as the right parietal lobe and left occipital lobe which is similar to prior. Nonspecific hypoattenuation in the periventricular and subcortical white matter favored to reflect chronic microvascular ischemic changes. Mild parenchymal volume loss. No mass effect or midline shift. The basilar cisterns are patent. Posterior fossa is unremarkable. No extra-axial fluid collections. Ventricles: The ventricles are normal. Vascular: No hyperdense vessel or unexpected calcification. Skull: Similar postsurgical changes of right frontotemporal craniectomy. No acute or aggressive findings. Orbits: Bilateral lens replacement. Sinuses: The visualized paranasal sinuses are clear. Other: Hematoma over the right aspect of the forehead extending over the supraorbital ridge. Mastoid air cells are clear. CT  CERVICAL SPINE FINDINGS Alignment: Straightening of the normal cervical lordosis. No significant listhesis. No facet subluxation or dislocation. Skull base and vertebrae: No acute fracture. No primary bone lesion or focal pathologic process. Diffuse osteopenia. There are confluent anterior osteophytes at C2-C4. Additional partial fusion of the C5-C7 vertebral bodies. Soft tissues and spinal canal: No prevertebral fluid or swelling. No visible canal hematoma. Disc levels: Intervertebral disc space narrowing at multiple levels most pronounced at C4-5. Disc osteophyte complexes at multiple levels. Mild spinal canal stenosis at C4-5. No high-grade osseous spinal canal stenosis. Facet arthrosis and uncovertebral hypertrophy at multiple levels. Foraminal stenosis at multiple levels.  Upper chest: No acute finding. Other: None. IMPRESSION: No CT evidence of acute intracranial abnormality. No acute fracture or traumatic malalignment of the cervical spine. Hematoma in the subcutaneous tissues of the right forehead. Chronic and degenerative changes as above. Electronically Signed   By: Donnice Mania M.D.   On: 07/23/2024 21:42   CT Cervical Spine Wo Contrast Result Date: 07/23/2024 CLINICAL DATA:  Trauma, mechanical fall, hematoma to right forehead. EXAM: CT HEAD WITHOUT CONTRAST CT CERVICAL SPINE WITHOUT CONTRAST TECHNIQUE: Multidetector CT imaging of the head and cervical spine was performed following the standard protocol without intravenous contrast. Multiplanar CT image reconstructions of the cervical spine were also generated. RADIATION DOSE REDUCTION: This exam was performed according to the departmental dose-optimization program which includes automated exposure control, adjustment of the mA and/or kV according to patient size and/or use of iterative reconstruction technique. COMPARISON:  CT head 06/05/2014, CT cervical spine 09/04/2012. FINDINGS: CT HEAD FINDINGS Brain: No acute intracranial hemorrhage. No CT  evidence of acute large territory infarct. Encephalomalacia involving the right insular cortex as well as the right parietal lobe and left occipital lobe which is similar to prior. Nonspecific hypoattenuation in the periventricular and subcortical white matter favored to reflect chronic microvascular ischemic changes. Mild parenchymal volume loss. No mass effect or midline shift. The basilar cisterns are patent. Posterior fossa is unremarkable. No extra-axial fluid collections. Ventricles: The ventricles are normal. Vascular: No hyperdense vessel or unexpected calcification. Skull: Similar postsurgical changes of right frontotemporal craniectomy. No acute or aggressive findings. Orbits: Bilateral lens replacement. Sinuses: The visualized paranasal sinuses are clear. Other: Hematoma over the right aspect of the forehead extending over the supraorbital ridge. Mastoid air cells are clear. CT CERVICAL SPINE FINDINGS Alignment: Straightening of the normal cervical lordosis. No significant listhesis. No facet subluxation or dislocation. Skull base and vertebrae: No acute fracture. No primary bone lesion or focal pathologic process. Diffuse osteopenia. There are confluent anterior osteophytes at C2-C4. Additional partial fusion of the C5-C7 vertebral bodies. Soft tissues and spinal canal: No prevertebral fluid or swelling. No visible canal hematoma. Disc levels: Intervertebral disc space narrowing at multiple levels most pronounced at C4-5. Disc osteophyte complexes at multiple levels. Mild spinal canal stenosis at C4-5. No high-grade osseous spinal canal stenosis. Facet arthrosis and uncovertebral hypertrophy at multiple levels. Foraminal stenosis at multiple levels. Upper chest: No acute finding. Other: None. IMPRESSION: No CT evidence of acute intracranial abnormality. No acute fracture or traumatic malalignment of the cervical spine. Hematoma in the subcutaneous tissues of the right forehead. Chronic and degenerative  changes as above. Electronically Signed   By: Donnice Mania M.D.   On: 07/23/2024 21:42   DG Ankle Complete Right Result Date: 07/23/2024 CLINICAL DATA:  Fall, pain EXAM: RIGHT ANKLE - COMPLETE 3+ VIEW COMPARISON:  None Available. FINDINGS: No acute bony abnormality. Specifically, no fracture, subluxation, or dislocation. Soft tissues are intact. IMPRESSION: No acute bony abnormality. Electronically Signed   By: Franky Crease M.D.   On: 07/23/2024 20:42     Procedures   Medications Ordered in the ED  Tdap (BOOSTRIX ) injection 0.5 mL (0.5 mLs Intramuscular Given 07/23/24 2059)  acetaminophen  (TYLENOL ) tablet 1,000 mg (1,000 mg Oral Given 07/23/24 2059)                                    Medical Decision Making Amount and/or Complexity of Data Reviewed Labs: ordered. Radiology: ordered.  Risk  OTC drugs. Prescription drug management.   BP 136/81   Pulse 91   Temp 98.6 F (37 C) (Oral)   Resp 16   Ht 5' 4 (1.626 m)   Wt 64.4 kg   BMI 24.37 kg/m   83:29 PM  83 year old female history of atrial fibrillation currently on Eliquis , diabetes, CAD, hypertension, polypharmacy, prior stroke brought here via EMS from home for evaluation of fall.  Patient states she lives at home by herself, she was outside trying to feed the birds this afternoon but states it was really hot and while she was turning around to walk back to her house she tripped on a step and fell down striking her head against the ground.  She denies any loss of consciousness she was able to call for help.  She is currently complaining of pain to her forehead, her right side of her body and her right ankle from the fall.  Although she has Eliquis  listed on her medication she states she does not take it.  She denies any recent medication change she denies any precipitating symptoms prior to the fall except which is really hot outside.  She denies any new numbness or new weakness denies any nausea or vomiting.  On exam patient is  resting comfortably appears to be in no acute discomfort.  She does have skin abrasion and hematoma noted to her right forehead with mild tenderness to palpation.  She has mild tenderness about her right shoulder and right elbow with full range of motion.  Her right ankle was tender as well.  Her right lower extremity is edematous compared to the left foot patient states that is an ongoing issue for several months and is actually improving.  She is mentating appropriately.  Vital signs reviewed and overall reassuring no fever no hypoxia.  -Labs ordered, independently viewed and interpreted by me.  Labs remarkable for WBC, normal H&H, electrolyte panels are reassuring -The patient was maintained on a cardiac monitor.  I personally viewed and interpreted the cardiac monitored which showed an underlying rhythm of: Atrial fibrillation -Imaging independently viewed and interpreted by me and I agree with radiologist's interpretation.  Result remarkable for CT scan of head/cspine without acute finding except a hematoma to R forehead.  Xray of R ankle unremarkable -This patient presents to the ED for concern of fall, this involves an extensive number of treatment options, and is a complaint that carries with it a high risk of complications and morbidity.  The differential diagnosis includes mechanical fall, anemia, hypoglycemia, stroke, strain, sprain, fracture, dislocation, contusion, ICH -Co morbidities that complicate the patient evaluation includes afib, diabetes, cad, HTN -Treatment includes tylenol  -Reevaluation of the patient after these medicines showed that the patient improved -PCP office notes or outside notes reviewed Care discussed with DR. Nanavati -Escalation to admission/observation considered: patients feels much better, is comfortable with discharge, and will follow up with PCP -Prescription medication considered, patient comfortable with tylenol  and vicodin -Social Determinant of Health  considered which includes food insecurity      Final diagnoses:  Fall, initial encounter  Contusion of face, initial encounter    ED Discharge Orders          Ordered    HYDROcodone -acetaminophen  (NORCO/VICODIN) 5-325 MG tablet  Every 6 hours PRN        07/23/24 2302    acetaminophen  (TYLENOL ) 500 MG tablet  Every 6 hours PRN        07/23/24 2302  Nivia Colon, PA-C 07/23/24 2308    Charlyn Sora, MD 07/28/24 1415

## 2024-07-24 NOTE — ED Notes (Signed)
 Writer attempted to call patient's neighbor at [phone] twice with no success. VM left. Neighbor was intended individual to give patient a ride home per patient.

## 2024-09-05 ENCOUNTER — Other Ambulatory Visit: Payer: Self-pay

## 2024-09-05 ENCOUNTER — Emergency Department (HOSPITAL_COMMUNITY): Admission: EM | Admit: 2024-09-05 | Discharge: 2024-09-05 | Disposition: A | Discharge status: Home / Self Care

## 2024-09-05 ENCOUNTER — Emergency Department (HOSPITAL_COMMUNITY)

## 2024-09-05 DIAGNOSIS — I482 Chronic atrial fibrillation, unspecified: Secondary | ICD-10-CM | POA: Diagnosis not present

## 2024-09-05 DIAGNOSIS — L03115 Cellulitis of right lower limb: Secondary | ICD-10-CM | POA: Diagnosis not present

## 2024-09-05 DIAGNOSIS — I10 Essential (primary) hypertension: Secondary | ICD-10-CM | POA: Diagnosis not present

## 2024-09-05 DIAGNOSIS — L0390 Cellulitis, unspecified: Secondary | ICD-10-CM

## 2024-09-05 DIAGNOSIS — E119 Type 2 diabetes mellitus without complications: Secondary | ICD-10-CM | POA: Insufficient documentation

## 2024-09-05 DIAGNOSIS — Z7901 Long term (current) use of anticoagulants: Secondary | ICD-10-CM | POA: Diagnosis not present

## 2024-09-05 DIAGNOSIS — I2510 Atherosclerotic heart disease of native coronary artery without angina pectoris: Secondary | ICD-10-CM | POA: Diagnosis not present

## 2024-09-05 DIAGNOSIS — Z4800 Encounter for change or removal of nonsurgical wound dressing: Secondary | ICD-10-CM | POA: Diagnosis present

## 2024-09-05 DIAGNOSIS — R030 Elevated blood-pressure reading, without diagnosis of hypertension: Secondary | ICD-10-CM

## 2024-09-05 DIAGNOSIS — Z79899 Other long term (current) drug therapy: Secondary | ICD-10-CM | POA: Insufficient documentation

## 2024-09-05 DIAGNOSIS — Z7984 Long term (current) use of oral hypoglycemic drugs: Secondary | ICD-10-CM | POA: Diagnosis not present

## 2024-09-05 LAB — CBC WITH DIFFERENTIAL/PLATELET
Abs Immature Granulocytes: 0.01 K/uL (ref 0.00–0.07)
Basophils Absolute: 0 K/uL (ref 0.0–0.1)
Basophils Relative: 0 %
Eosinophils Absolute: 0.1 K/uL (ref 0.0–0.5)
Eosinophils Relative: 1 %
HCT: 42.1 % (ref 36.0–46.0)
Hemoglobin: 12.7 g/dL (ref 12.0–15.0)
Immature Granulocytes: 0 %
Lymphocytes Relative: 19 %
Lymphs Abs: 0.9 K/uL (ref 0.7–4.0)
MCH: 27.1 pg (ref 26.0–34.0)
MCHC: 30.2 g/dL (ref 30.0–36.0)
MCV: 90 fL (ref 80.0–100.0)
Monocytes Absolute: 0.5 K/uL (ref 0.1–1.0)
Monocytes Relative: 10 %
Neutro Abs: 3.3 K/uL (ref 1.7–7.7)
Neutrophils Relative %: 70 %
Platelets: 147 K/uL — ABNORMAL LOW (ref 150–400)
RBC: 4.68 MIL/uL (ref 3.87–5.11)
RDW: 14.9 % (ref 11.5–15.5)
WBC: 4.7 K/uL (ref 4.0–10.5)
nRBC: 0 % (ref 0.0–0.2)

## 2024-09-05 LAB — BASIC METABOLIC PANEL WITH GFR
Anion gap: 9 (ref 5–15)
BUN: 10 mg/dL (ref 8–23)
CO2: 27 mmol/L (ref 22–32)
Calcium: 9.5 mg/dL (ref 8.9–10.3)
Chloride: 105 mmol/L (ref 98–111)
Creatinine, Ser: 0.64 mg/dL (ref 0.44–1.00)
GFR, Estimated: >60 mL/min (ref >60)
Glucose, Bld: 87 mg/dL (ref 70–99)
Potassium: 3.8 mmol/L (ref 3.5–5.1)
Sodium: 141 mmol/L (ref 135–145)

## 2024-09-05 LAB — LACTIC ACID, PLASMA: Lactic Acid, Venous: 1.1 mmol/L (ref 0.5–1.9)

## 2024-09-05 LAB — CBG MONITORING, ED: Glucose-Capillary: 82 mg/dL (ref 70–99)

## 2024-09-05 MED ORDER — CEPHALEXIN 500 MG PO CAPS: 500.0000 mg | ORAL_CAPSULE | Freq: Four times a day (QID) | ORAL | 0 refills | Status: AC

## 2024-09-05 MED ORDER — METOPROLOL TARTRATE 50 MG PO TABS
50.0000 mg | ORAL_TABLET | Freq: Two times a day (BID) | ORAL | 0 refills | Status: DC
Start: 2024-09-05 — End: 2024-09-05

## 2024-09-05 MED ORDER — CEPHALEXIN 500 MG PO CAPS: 500.0000 mg | ORAL_CAPSULE | Freq: Four times a day (QID) | ORAL | 0 refills | Status: DC

## 2024-09-05 MED ORDER — LOSARTAN POTASSIUM 50 MG PO TABS: 50.0000 mg | ORAL_TABLET | Freq: Every day | ORAL | 0 refills | Status: AC

## 2024-09-05 MED ORDER — DOXYCYCLINE HYCLATE 100 MG PO CAPS: 100.0000 mg | ORAL_CAPSULE | Freq: Two times a day (BID) | ORAL | 0 refills | Status: DC

## 2024-09-05 MED ORDER — DOXYCYCLINE HYCLATE 100 MG PO CAPS: 100.0000 mg | ORAL_CAPSULE | Freq: Two times a day (BID) | ORAL | 0 refills | Status: AC

## 2024-09-05 MED ORDER — LOSARTAN POTASSIUM 50 MG PO TABS
50.0000 mg | ORAL_TABLET | Freq: Once | ORAL | Status: AC
  Administered 2024-09-05: 50 mg via ORAL
  Filled 2024-09-05: qty 1

## 2024-09-05 MED ORDER — METOPROLOL TARTRATE 25 MG PO TABS
50.0000 mg | ORAL_TABLET | Freq: Once | ORAL | Status: AC
  Administered 2024-09-05: 50 mg via ORAL
  Filled 2024-09-05: qty 2

## 2024-09-05 MED ORDER — LOSARTAN POTASSIUM 50 MG PO TABS
50.0000 mg | ORAL_TABLET | Freq: Every day | ORAL | 0 refills | Status: DC
Start: 2024-09-05 — End: 2024-09-05

## 2024-09-05 MED ORDER — METOPROLOL TARTRATE 50 MG PO TABS: 50.0000 mg | ORAL_TABLET | Freq: Two times a day (BID) | ORAL | 0 refills | Status: AC

## 2024-09-05 MED ORDER — SODIUM CHLORIDE 0.9 % IV SOLN
1.0000 g | Freq: Once | INTRAVENOUS | Status: AC
  Administered 2024-09-05: 1 g via INTRAVENOUS
  Filled 2024-09-05: qty 10

## 2024-09-05 NOTE — ED Provider Notes (Signed)
 83 yo female with right leg wound x 4 months or November 2024.  Labs normal. Dc on abx. Hold for repeat BP after meds. Physical Exam  BP (!) 155/110   Pulse 84   Temp 98.7 F (37.1 C) (Oral)   Resp 14   SpO2 100%   Physical Exam  Procedures  Procedures  ED Course / MDM    Medical Decision Making Amount and/or Complexity of Data Reviewed Labs: ordered. Radiology: ordered.  Risk Prescription drug management.   BP 118/96. Patient has met dc goal of BP improvement. Can be dc to recheck with PCP and take abx as prescribed by care team today.        Beverley Leita LABOR, PA-C 09/05/24 2249    Simon Lavonia SAILOR, MD 09/05/24 [phone]

## 2024-09-05 NOTE — ED Provider Notes (Signed)
 [place] EMERGENCY DEPARTMENT AT [facility] Provider Note   CSN: [id number] Arrival date & time: 09/05/24  8191     Patient presents with: Wound Check and Hypertension   Al Gagen is a 83 y.o. female with a past medical history significant for A-fib on chronic Eliquis , diabetes, CAD, hypertension, and chronic pain syndrome who presents to the ED due to wound to right lower extremity that has been present for 3 to 4 months.  Patient sent from urgent care for further evaluation.  Notes she was possibly bit by a spider 3 to 4 months ago and has had worsening of wound ever since.  No fever or chills.  Has not been on any antibiotics.  Admits to drainage from site. Has not seen wound care.  History obtained from patient and past medical records. No interpreter used during encounter.       Prior to Admission medications   Medication Sig Start Date End Date Taking? Authorizing Provider  cephALEXin  (KEFLEX ) 500 MG capsule Take 1 capsule (500 mg total) by mouth 4 (four) times daily for 7 days. 09/05/24 09/12/24 Yes Shaelin Lalley, Aleck BROCKS, PA-C  doxycycline  (VIBRAMYCIN ) 100 MG capsule Take 1 capsule (100 mg total) by mouth 2 (two) times daily for 7 days. 09/05/24 09/12/24 Yes Abbigail Anstey, Aleck BROCKS, PA-C  losartan  (COZAAR ) 50 MG tablet Take 1 tablet (50 mg total) by mouth daily. 09/05/24  Yes Fremont Skalicky, Aleck BROCKS, PA-C  metoprolol  tartrate (LOPRESSOR ) 50 MG tablet Take 1 tablet (50 mg total) by mouth 2 (two) times daily. 09/05/24  Yes Creedence Heiss, Aleck BROCKS, PA-C  acetaminophen  (TYLENOL ) 500 MG tablet Take 1 tablet (500 mg total) by mouth every 6 (six) hours as needed. 07/23/24   Charlyn Sora, MD  albuterol  (VENTOLIN  HFA) 108 (90 Base) MCG/ACT inhaler Inhale 2 puffs into the lungs every 6 (six) hours as needed for wheezing or shortness of breath (SOB).    [provider]  apixaban  (ELIQUIS ) 5 MG TABS tablet Take 1 tablet (5 mg total) by mouth 2 (two) times daily. 02/26/18    Rollene Almarie LABOR, MD  colchicine 0.6 MG tablet Take 0.6 mg by mouth 2 (two) times daily. 04/05/21   [provider]  diclofenac Sodium (VOLTAREN) 1 % GEL Voltaren 1 % topical gel  APPLY 2 GRAMS TO THE AFFECTED AREA(S) BY TOPICAL ROUTE 4 TIMES PER DAY    [provider]  Fluticasone-Salmeterol (ADVAIR) 100-50 MCG/DOSE AEPB Inhale 1 puff into the lungs 2 (two) times daily.    [provider]  furosemide  (LASIX ) 40 MG tablet Take 1 tablet (40 mg total) by mouth daily as needed (leg swelling). Keep 9/5/ appointment for further refills 08/23/17   Rollene Almarie LABOR, MD  loratadine  (CLARITIN ) 10 MG tablet TAKE 1 TABLET (10 MG TOTAL) BY MOUTH DAILY AS NEEDED FOR ALLERGIES. Patient not taking: Reported on 03/29/2019 01/06/17   Rollene Almarie LABOR, MD  losartan  (COZAAR ) 25 MG tablet Take 25 mg by mouth daily. 02/03/21   [provider]  losartan  (COZAAR ) 50 MG tablet Take 50 mg by mouth daily.    [provider]  metFORMIN  (GLUCOPHAGE ) 500 MG tablet TAKE 1 TABLET (500 MG TOTAL) BY MOUTH DAILY WITH BREAKFAST. 11/15/17   Rollene Almarie LABOR, MD  metoprolol  succinate (TOPROL -XL) 50 MG 24 hr tablet metoprolol  succinate ER 50 mg tablet,extended release 24 hr    [provider]  metoprolol  tartrate (LOPRESSOR ) 50 MG tablet Take 50 mg by mouth 2 (two) times  daily.    [provider]  oxyCODONE -acetaminophen  (PERCOCET/ROXICET) 5-325 MG tablet Take 1 tablet by mouth 3 (three) times daily as needed. 04/23/21   [provider]  potassium chloride  (KLOR-CON ) 20 MEQ packet Take by mouth 2 (two) times daily. Patient not taking: Reported on 12/15/2020    [provider]  potassium chloride  SA (KLOR-CON ) 20 MEQ tablet potassium chloride  ER 20 mEq tablet,extended release(part/cryst)    [provider]  terbinafine (LAMISIL) 250 MG tablet Take 250 mg by mouth daily.    [provider]    Allergies: Penicillins    Review  of Systems  Constitutional:  Negative for chills and fever.  Skin:  Positive for wound.    Updated Vital Signs BP (!) 155/110   Pulse 84   Temp 98.7 F (37.1 C) (Oral)   Resp 14   SpO2 100%   Physical Exam Vitals and nursing note reviewed.  Constitutional:      General: She is not in acute distress.    Appearance: She is not ill-appearing.  HENT:     Head: Normocephalic.  Eyes:     Pupils: Pupils are equal, round, and reactive to light.  Cardiovascular:     Rate and Rhythm: Normal rate and regular rhythm.     Pulses: Normal pulses.     Heart sounds: Normal heart sounds. No murmur heard.    No friction rub. No gallop.  Pulmonary:     Effort: Pulmonary effort is normal.     Breath sounds: Normal breath sounds.  Abdominal:     General: Abdomen is flat. There is no distension.     Palpations: Abdomen is soft.     Tenderness: There is no abdominal tenderness. There is no guarding or rebound.  Musculoskeletal:        General: Normal range of motion.     Cervical back: Neck supple.     Comments: 2+ pitting edema to RLE. Wound to right lower extremity. See photo below.   Skin:    General: Skin is warm and dry.  Neurological:     General: No focal deficit present.     Mental Status: She is alert.  Psychiatric:        Mood and Affect: Mood normal.        Behavior: Behavior normal.     (all labs ordered are listed, but only abnormal results are displayed) Labs Reviewed  CBC WITH DIFFERENTIAL/PLATELET - Abnormal; Notable for the following components:      Result Value   Platelets 147 (*)    All other components within normal limits  CULTURE, BLOOD (ROUTINE X 2)  CULTURE, BLOOD (ROUTINE X 2)  BASIC METABOLIC PANEL WITH GFR  LACTIC ACID, PLASMA  CBG MONITORING, ED    EKG: None  Radiology: DG Tibia/Fibula Right Result Date: 09/05/2024 CLINICAL DATA:  Infection. EXAM: RIGHT TIBIA AND FIBULA - 2 VIEW COMPARISON:  Right lower extremity radiograph dated 07/23/2024  FINDINGS: Partially visualized total right knee arthroplasty. No acute fracture or dislocation. The bones are osteopenic. There is diffuse subcutaneous edema, likely cellulitis. No soft tissue gas. IMPRESSION: 1. No acute fracture or dislocation. 2. Diffuse subcutaneous edema, likely cellulitis. Electronically Signed   By: Vanetta Chou M.D.   On: 09/05/2024 21:15     Procedures   Medications Ordered in the ED  losartan  (COZAAR ) tablet 50 mg (has no administration in time range)  metoprolol  tartrate (LOPRESSOR ) tablet 50 mg (has no administration in time range)  cefTRIAXone  (ROCEPHIN ) 1 g in sodium chloride  0.9 % 100 mL IVPB (0 g Intravenous Stopped 09/05/24 2137)                                    Medical Decision Making Amount and/or Complexity of Data Reviewed Labs: ordered. Decision-making details documented in ED Course. Radiology: ordered and independent interpretation performed. Decision-making details documented in ED Course.   This patient presents to the ED for concern of wound, this involves an extensive number of treatment options, and is a complaint that carries with it a high risk of complications and morbidity.  The differential diagnosis includes cellulitis, osteomyelitis, sepsis, etc  83 year old female presents to the ED due to right lower extremity wound for the past 3 to 4 months.  No fever or chills.  Believes she was bit by insect 3 to 4 months ago.  Patient does have an urgent care note from November 2024 about right lower extremity wound unclear whether or not this is the same wound.  Upon arrival patient afebrile, not tachycardic or hypoxic.  BP elevated 174/107.  Patient with 2+ pitting edema to right lower extremity.  Wound on lower extremity.  See photo above. Pedal pulses palpable. Routine labs ordered.  Blood cultures.  X-ray to rule out evidence of osteomyelitis.  IV antibiotics started. Discussed with Dr. Simon who evaluated patient at bedside and agrees with  assessment and plan.  CBC with no leukocytosis.  Normal hemoglobin.  BMP unremarkable.  Normal renal function.  No major electrolyte derangements.  X-ray personally reviewed and interpreted which is negative for any acute abnormalities.  Does demonstrate diffuse subcutaneous edema likely related to cellulitis. Lactic normal. Low suspicion for sepsis. Given the wound has been present for numerous months, do not feel patient needs to be admitted to the hospital for IV antibiotics. Will trial outpatient po antibiotics. Wound care referral placed for patient.   Just prior to discharge patient's BP rechecked which was 197/120. Patient ran out of her BP medications. Home doses given to patient and will recheck BP. Patient asymptomatic. Low suspicion for hypertensive emergency/urgency.  Patient handed off to Leita Chancy, PA-C at shift change pending BP improvement.   Has PCP Hx HTN Elderly >65    Final diagnoses:  Wound cellulitis  Elevated blood pressure reading    ED Discharge Orders          Ordered    AMB referral to wound care center        09/05/24 2131    doxycycline  (VIBRAMYCIN ) 100 MG capsule  2 times daily        09/05/24 2137    cephALEXin  (KEFLEX ) 500 MG capsule  4 times daily        09/05/24 2137    losartan  (COZAAR ) 50 MG tablet  Daily        09/05/24 2155    metoprolol  tartrate (LOPRESSOR ) 50 MG tablet  2 times daily        09/05/24 2155               Ephraim Reichel C, PA-C 09/05/24 2214    Simon Lavonia SAILOR, MD 09/05/24 2357

## 2024-09-05 NOTE — Discharge Instructions (Addendum)
 It was a pleasure taking care of you today.  As discussed, I am sending you home with 2 antibiotics.  Take as prescribed and finish all antibiotics. I have placed a referral to wound care. Call to schedule an appointment.   Your blood pressure was elevated in the ED. Have your PCP recheck your BP in 2 days. I have refilled your BP medications. Take as prescribed.  Return to the ER for new or worsening symptoms.

## 2024-09-05 NOTE — ED Triage Notes (Signed)
 Pt BIB [facility] from [facility] C/O RLE skin infection X 4 months. Uncontrolled DM, also requesting help with diabetes control. SBP 206 for EMS, denies headache, vision changes, weakness or numbness.

## 2024-09-10 LAB — CULTURE, BLOOD (ROUTINE X 2)
Culture: NO GROWTH
Culture: NO GROWTH

## 2024-09-11 ENCOUNTER — Encounter (HOSPITAL_BASED_OUTPATIENT_CLINIC_OR_DEPARTMENT_OTHER): Admitting: Internal Medicine

## 2024-09-18 ENCOUNTER — Encounter (HOSPITAL_BASED_OUTPATIENT_CLINIC_OR_DEPARTMENT_OTHER): Attending: Internal Medicine | Admitting: Internal Medicine

## 2024-09-18 DIAGNOSIS — L97818 Non-pressure chronic ulcer of other part of right lower leg with other specified severity: Secondary | ICD-10-CM | POA: Insufficient documentation

## 2024-09-18 DIAGNOSIS — E11622 Type 2 diabetes mellitus with other skin ulcer: Secondary | ICD-10-CM | POA: Insufficient documentation

## 2024-09-18 DIAGNOSIS — I70242 Atherosclerosis of native arteries of left leg with ulceration of calf: Secondary | ICD-10-CM | POA: Insufficient documentation

## 2024-09-23 ENCOUNTER — Encounter (HOSPITAL_COMMUNITY): Payer: Self-pay | Admitting: Registered Nurse

## 2024-09-23 ENCOUNTER — Other Ambulatory Visit (HOSPITAL_COMMUNITY): Payer: Self-pay | Admitting: Registered Nurse

## 2024-09-23 DIAGNOSIS — S91309A Unspecified open wound, unspecified foot, initial encounter: Secondary | ICD-10-CM

## 2024-09-24 ENCOUNTER — Ambulatory Visit (HOSPITAL_COMMUNITY): Admission: RE | Admit: 2024-09-24 | Source: Ambulatory Visit

## 2024-09-26 ENCOUNTER — Telehealth: Payer: Self-pay

## 2024-09-26 ENCOUNTER — Encounter (HOSPITAL_COMMUNITY): Payer: Self-pay

## 2024-09-26 ENCOUNTER — Encounter (HOSPITAL_COMMUNITY)

## 2024-09-26 ENCOUNTER — Ambulatory Visit (HOSPITAL_COMMUNITY)

## 2024-09-26 NOTE — Telephone Encounter (Signed)
 FYI/ and advise as MD is out of office

## 2024-09-26 NOTE — Telephone Encounter (Signed)
 Copied from CRM [id number]. Topic: General - Other >> Sep 26, 2024 10:58 AM Tinnie BROCKS wrote: Reason for CRM: FYI onlyGLENWOOD Pulling with [facility] calling to let us  know that pt was very confused while speaking with them about scheduling her needed ultrasounds for 10/2. Pt stated she probably wouldn't have a ride. Pulling wanted to make sure we were aware that pt may not be able to attend her ultrasounds and that she might have something going on w/ memory.

## 2024-09-26 NOTE — Telephone Encounter (Signed)
 I have not seen patient since 2018 so likely not her current pcp.

## 2024-09-27 ENCOUNTER — Ambulatory Visit (HOSPITAL_BASED_OUTPATIENT_CLINIC_OR_DEPARTMENT_OTHER): Admitting: Internal Medicine

## 2024-11-25 ENCOUNTER — Encounter (HOSPITAL_BASED_OUTPATIENT_CLINIC_OR_DEPARTMENT_OTHER): Attending: Internal Medicine | Admitting: Internal Medicine

## 2024-11-25 DIAGNOSIS — I70242 Atherosclerosis of native arteries of left leg with ulceration of calf: Secondary | ICD-10-CM | POA: Insufficient documentation

## 2024-11-25 DIAGNOSIS — E11622 Type 2 diabetes mellitus with other skin ulcer: Secondary | ICD-10-CM | POA: Insufficient documentation

## 2024-11-25 DIAGNOSIS — L97818 Non-pressure chronic ulcer of other part of right lower leg with other specified severity: Secondary | ICD-10-CM | POA: Insufficient documentation

## 2024-11-26 ENCOUNTER — Other Ambulatory Visit: Payer: Self-pay | Admitting: Registered Nurse

## 2024-11-26 DIAGNOSIS — K769 Liver disease, unspecified: Secondary | ICD-10-CM

## 2024-11-28 ENCOUNTER — Ambulatory Visit (HOSPITAL_COMMUNITY): Admission: RE | Admit: 2024-11-28 | Discharge: 2024-11-28 | Attending: Registered Nurse

## 2024-11-28 ENCOUNTER — Ambulatory Visit (HOSPITAL_COMMUNITY)
Admission: RE | Admit: 2024-11-28 | Discharge: 2024-11-28 | Disposition: A | Discharge status: Home / Self Care | Source: Ambulatory Visit | Attending: Registered Nurse | Admitting: Registered Nurse

## 2024-11-28 DIAGNOSIS — S91301A Unspecified open wound, right foot, initial encounter: Secondary | ICD-10-CM

## 2024-11-28 DIAGNOSIS — S91309A Unspecified open wound, unspecified foot, initial encounter: Secondary | ICD-10-CM | POA: Insufficient documentation

## 2024-11-29 LAB — VAS US ABI WITH/WO TBI
Left ABI: 1.18
Right ABI: 0.98

## 2024-12-02 ENCOUNTER — Encounter (HOSPITAL_BASED_OUTPATIENT_CLINIC_OR_DEPARTMENT_OTHER): Admitting: Internal Medicine

## 2024-12-10 ENCOUNTER — Inpatient Hospital Stay: Admission: RE | Admit: 2024-12-10 | Discharge: 2024-12-10 | Attending: Registered Nurse

## 2024-12-10 ENCOUNTER — Encounter (HOSPITAL_BASED_OUTPATIENT_CLINIC_OR_DEPARTMENT_OTHER): Admitting: Internal Medicine

## 2024-12-10 DIAGNOSIS — I70242 Atherosclerosis of native arteries of left leg with ulceration of calf: Secondary | ICD-10-CM | POA: Diagnosis not present

## 2024-12-10 DIAGNOSIS — K769 Liver disease, unspecified: Secondary | ICD-10-CM

## 2024-12-10 DIAGNOSIS — L97818 Non-pressure chronic ulcer of other part of right lower leg with other specified severity: Secondary | ICD-10-CM | POA: Diagnosis not present

## 2024-12-10 DIAGNOSIS — E11622 Type 2 diabetes mellitus with other skin ulcer: Secondary | ICD-10-CM | POA: Diagnosis not present

## 2024-12-17 ENCOUNTER — Telehealth: Payer: Self-pay

## 2024-12-17 NOTE — Telephone Encounter (Signed)
 Copied from CRM #[id number]. Topic: Clinical - Medical Advice >> Dec 17, 2024  8:01 AM Mia F wrote: Reason for CRM: [facility] that[facility] is receiving care from [facility] and they are unable to provide additional service.   Nurse [phone]

## 2024-12-31 ENCOUNTER — Encounter (HOSPITAL_BASED_OUTPATIENT_CLINIC_OR_DEPARTMENT_OTHER): Admitting: Internal Medicine

## 2025-01-02 ENCOUNTER — Encounter (HOSPITAL_BASED_OUTPATIENT_CLINIC_OR_DEPARTMENT_OTHER): Attending: Internal Medicine | Admitting: Internal Medicine

## 2025-01-02 DIAGNOSIS — E11622 Type 2 diabetes mellitus with other skin ulcer: Secondary | ICD-10-CM | POA: Insufficient documentation

## 2025-01-02 DIAGNOSIS — I70242 Atherosclerosis of native arteries of left leg with ulceration of calf: Secondary | ICD-10-CM | POA: Insufficient documentation

## 2025-01-02 DIAGNOSIS — L97818 Non-pressure chronic ulcer of other part of right lower leg with other specified severity: Secondary | ICD-10-CM | POA: Insufficient documentation

## 2025-01-04 ENCOUNTER — Encounter (HOSPITAL_COMMUNITY): Payer: Self-pay | Admitting: *Deleted

## 2025-01-04 ENCOUNTER — Other Ambulatory Visit: Payer: Self-pay

## 2025-01-04 ENCOUNTER — Emergency Department (HOSPITAL_COMMUNITY)

## 2025-01-04 ENCOUNTER — Emergency Department (HOSPITAL_COMMUNITY)
Admission: EM | Admit: 2025-01-04 | Discharge: 2025-01-05 | Disposition: A | Attending: Emergency Medicine | Admitting: Emergency Medicine

## 2025-01-04 DIAGNOSIS — R079 Chest pain, unspecified: Secondary | ICD-10-CM | POA: Insufficient documentation

## 2025-01-04 DIAGNOSIS — Z7984 Long term (current) use of oral hypoglycemic drugs: Secondary | ICD-10-CM | POA: Diagnosis not present

## 2025-01-04 DIAGNOSIS — R600 Localized edema: Secondary | ICD-10-CM | POA: Diagnosis not present

## 2025-01-04 DIAGNOSIS — J45909 Unspecified asthma, uncomplicated: Secondary | ICD-10-CM | POA: Insufficient documentation

## 2025-01-04 DIAGNOSIS — I4891 Unspecified atrial fibrillation: Secondary | ICD-10-CM | POA: Insufficient documentation

## 2025-01-04 DIAGNOSIS — J90 Pleural effusion, not elsewhere classified: Secondary | ICD-10-CM | POA: Insufficient documentation

## 2025-01-04 DIAGNOSIS — E119 Type 2 diabetes mellitus without complications: Secondary | ICD-10-CM | POA: Insufficient documentation

## 2025-01-04 DIAGNOSIS — K828 Other specified diseases of gallbladder: Secondary | ICD-10-CM | POA: Diagnosis not present

## 2025-01-04 LAB — TROPONIN T, HIGH SENSITIVITY
Troponin T High Sensitivity: 29 ng/L — ABNORMAL HIGH (ref 0–19)
Troponin T High Sensitivity: 28 ng/L — ABNORMAL HIGH (ref 0–19)

## 2025-01-04 LAB — CBC
HCT: 44.3 % (ref 36.0–46.0)
Hemoglobin: 13.7 g/dL (ref 12.0–15.0)
MCH: 27 pg (ref 26.0–34.0)
MCHC: 30.9 g/dL (ref 30.0–36.0)
MCV: 87.4 fL (ref 80.0–100.0)
Platelets: 129 K/uL — ABNORMAL LOW (ref 150–400)
RBC: 5.07 MIL/uL (ref 3.87–5.11)
RDW: 15.6 % — ABNORMAL HIGH (ref 11.5–15.5)
WBC: 3.6 K/uL — ABNORMAL LOW (ref 4.0–10.5)
nRBC: 0 % (ref 0.0–0.2)

## 2025-01-04 LAB — BASIC METABOLIC PANEL WITH GFR
Anion gap: 11 (ref 5–15)
BUN: 11 mg/dL (ref 8–23)
CO2: 24 mmol/L (ref 22–32)
Calcium: 9.5 mg/dL (ref 8.9–10.3)
Chloride: 106 mmol/L (ref 98–111)
Creatinine, Ser: 0.82 mg/dL (ref 0.44–1.00)
GFR, Estimated: >60 mL/min
Glucose, Bld: 111 mg/dL — ABNORMAL HIGH (ref 70–99)
Potassium: 4.2 mmol/L (ref 3.5–5.1)
Sodium: 141 mmol/L (ref 135–145)

## 2025-01-04 LAB — HEPATIC FUNCTION PANEL
ALT: 14 U/L (ref 0–44)
AST: 34 U/L (ref 15–41)
Albumin: 4.1 g/dL (ref 3.5–5.0)
Alkaline Phosphatase: 78 U/L (ref 38–126)
Bilirubin, Direct: 0.3 mg/dL — ABNORMAL HIGH (ref 0.0–0.2)
Indirect Bilirubin: 0.6 mg/dL (ref 0.3–0.9)
Total Bilirubin: 0.9 mg/dL (ref 0.0–1.2)
Total Protein: 7.2 g/dL (ref 6.5–8.1)

## 2025-01-04 LAB — LIPASE, BLOOD: Lipase: 25 U/L (ref 11–51)

## 2025-01-04 MED ORDER — MORPHINE SULFATE (PF) 4 MG/ML IV SOLN
4.0000 mg | Freq: Once | INTRAVENOUS | Status: AC
  Administered 2025-01-04: 4 mg via INTRAVENOUS
  Filled 2025-01-04: qty 1

## 2025-01-04 MED ORDER — ASPIRIN 81 MG PO CHEW
324.0000 mg | CHEWABLE_TABLET | Freq: Once | ORAL | Status: AC
  Administered 2025-01-04: 324 mg via ORAL
  Filled 2025-01-04: qty 4

## 2025-01-04 MED ORDER — PANTOPRAZOLE SODIUM 40 MG IV SOLR
40.0000 mg | Freq: Once | INTRAVENOUS | Status: AC
  Administered 2025-01-04: 40 mg via INTRAVENOUS
  Filled 2025-01-04: qty 10

## 2025-01-04 MED ORDER — ACETAMINOPHEN 325 MG PO TABS
650.0000 mg | ORAL_TABLET | Freq: Once | ORAL | Status: AC
  Administered 2025-01-04: 650 mg via ORAL
  Filled 2025-01-04: qty 2

## 2025-01-04 MED ORDER — DILTIAZEM HCL 25 MG/5ML IV SOLN
10.0000 mg | Freq: Once | INTRAVENOUS | Status: AC
  Administered 2025-01-04: 10 mg via INTRAVENOUS
  Filled 2025-01-04: qty 5

## 2025-01-04 MED ORDER — IOHEXOL 350 MG/ML SOLN
75.0000 mL | Freq: Once | INTRAVENOUS | Status: AC | PRN
  Administered 2025-01-04: 75 mL via INTRAVENOUS

## 2025-01-04 MED ORDER — HYDRALAZINE HCL 20 MG/ML IJ SOLN
5.0000 mg | Freq: Once | INTRAMUSCULAR | Status: AC
  Administered 2025-01-04: 5 mg via INTRAVENOUS
  Filled 2025-01-04: qty 1

## 2025-01-04 NOTE — ED Triage Notes (Addendum)
 Pt here via [facility] from home.  Called 911 c/o chest pain.  Acute onset while sitting on couch - accompanied by nausea (1 pm).  Pt was given 1 breathing tx with improvement of pain.  Minimal wheezing noted per [facility].  Pt also had 1 episode of loose stool before getting on the ambulance.  174/100 80-100 afib (hx of ) 98% ra 18 rr Cbg 134

## 2025-01-04 NOTE — ED Notes (Signed)
 Patient transported to CT scan .

## 2025-01-04 NOTE — ED Provider Notes (Signed)
 6:58 AM Assumed care from Dr. Randol, please see their note for full history, physical and decision making until this point. In brief this is a 84 y.o. year old female who presented to the ED tonight with Chest Pain      Pending US  and BNP. Maybe increase diuresis depending. Surg consult possible.   BNP elevated.  Ultrasound negative.  No hypoxia, dyspnea or respiratory distress.  Troponin slightly elevated likely related to pulmonary edema.  She takes her Lasix  as needed in fact I do not think she takes it at all because the medicines that she normally takes are in her bag and it is not one of them.  Will represcribe at previous dose and asked her to take it every day for the next 7 days and then return to taking it as needed.  She will need to follow-up with her doctor in a week to make sure things are improving.  She will return here for any new or worsening symptoms.  Overall patient appears well stable for discharge.  Discharge instructions, including strict return precautions for new or worsening symptoms, given. Patient and/or family verbalized understanding and agreement with the plan as described.   Labs, studies and imaging reviewed by myself and considered in medical decision making if ordered. Imaging interpreted by radiology.  Labs Reviewed  BASIC METABOLIC PANEL WITH GFR - Abnormal; Notable for the following components:      Result Value   Glucose, Bld 111 (*)    All other components within normal limits  CBC - Abnormal; Notable for the following components:   WBC 3.6 (*)    RDW 15.6 (*)    Platelets 129 (*)    All other components within normal limits  HEPATIC FUNCTION PANEL - Abnormal; Notable for the following components:   Bilirubin, Direct 0.3 (*)    All other components within normal limits  PRO BRAIN NATRIURETIC PEPTIDE - Abnormal; Notable for the following components:   Pro Brain Natriuretic Peptide 9,705.0 (*)    All other components within normal limits  TROPONIN T,  HIGH SENSITIVITY - Abnormal; Notable for the following components:   Troponin T High Sensitivity 29 (*)    All other components within normal limits  TROPONIN T, HIGH SENSITIVITY - Abnormal; Notable for the following components:   Troponin T High Sensitivity 28 (*)    All other components within normal limits  LIPASE, BLOOD  CBG MONITORING, ED    US  Abdomen Limited RUQ (LIVER/GB)  Final Result    CT Angio Chest PE W and/or Wo Contrast  Final Result    CT ABDOMEN PELVIS W CONTRAST  Final Result    DG Chest Portable 1 View  Final Result      No follow-ups on file.    Jhoselyn Ruffini, Selinda, MD 01/05/25 [phone]

## 2025-01-04 NOTE — ED Provider Notes (Signed)
 " [place] EMERGENCY DEPARTMENT AT [facility] Provider Note   CSN: [id number] Arrival date & time: 01/04/25  1740     Patient presents with: Chest Pain   [name] is a 84 y.o. female.  {Add pertinent medical, surgical, social history, OB history to YEP:67052}  Chest Pain    Patient has a history of stroke, myocardial infarction, asthma, diabetes, arthritis, gout, atrial fibrillation, DVT.  Patient states she had an episode of feeling acutely short of breath today.  Patient also was having discomfort in the center of her chest.  It occurred while she was at rest.  Patient states she had did have some nausea.  Symptoms lasted for maybe an hour.  EMS noted some mild wheezing she was given a breathing treatment.  Patient states her symptoms have resolved.  She denies any fevers.  She has not noticed any significant leg swelling.  Prior to Admission medications  Medication Sig Start Date End Date Taking? Authorizing Provider  acetaminophen  (TYLENOL ) 500 MG tablet Take 1 tablet (500 mg total) by mouth every 6 (six) hours as needed. 07/23/24   Charlyn Sora, MD  albuterol  (VENTOLIN  HFA) 108 (90 Base) MCG/ACT inhaler Inhale 2 puffs into the lungs every 6 (six) hours as needed for wheezing or shortness of breath (SOB).    [provider]  apixaban  (ELIQUIS ) 5 MG TABS tablet Take 1 tablet (5 mg total) by mouth 2 (two) times daily. 02/26/18   Rollene Almarie LABOR, MD  colchicine 0.6 MG tablet Take 0.6 mg by mouth 2 (two) times daily. 04/05/21   [provider]  diclofenac Sodium (VOLTAREN) 1 % GEL Voltaren 1 % topical gel  APPLY 2 GRAMS TO THE AFFECTED AREA(S) BY TOPICAL ROUTE 4 TIMES PER DAY    [provider]  Fluticasone-Salmeterol (ADVAIR) 100-50 MCG/DOSE AEPB Inhale 1 puff into the lungs 2 (two) times daily.    [provider]  furosemide  (LASIX ) 40 MG tablet Take 1 tablet (40 mg total) by mouth daily as needed (leg swelling). Keep  9/5/ appointment for further refills 08/23/17   Rollene Almarie LABOR, MD  loratadine  (CLARITIN ) 10 MG tablet TAKE 1 TABLET (10 MG TOTAL) BY MOUTH DAILY AS NEEDED FOR ALLERGIES. Patient not taking: Reported on 03/29/2019 01/06/17   Rollene Almarie LABOR, MD  losartan  (COZAAR ) 25 MG tablet Take 25 mg by mouth daily. 02/03/21   [provider]  losartan  (COZAAR ) 50 MG tablet Take 50 mg by mouth daily.    [provider]  losartan  (COZAAR ) 50 MG tablet Take 1 tablet (50 mg total) by mouth daily. 09/05/24   Minnie Tinnie BRAVO, PA  metFORMIN  (GLUCOPHAGE ) 500 MG tablet TAKE 1 TABLET (500 MG TOTAL) BY MOUTH DAILY WITH BREAKFAST. 11/15/17   Rollene Almarie LABOR, MD  metoprolol  succinate (TOPROL -XL) 50 MG 24 hr tablet metoprolol  succinate ER 50 mg tablet,extended release 24 hr    [provider]  metoprolol  tartrate (LOPRESSOR ) 50 MG tablet Take 50 mg by mouth 2 (two) times daily.    [provider]  metoprolol  tartrate (LOPRESSOR ) 50 MG tablet Take 1 tablet (50 mg total) by mouth 2 (two) times daily. 09/05/24   Minnie Tinnie BRAVO, PA  oxyCODONE -acetaminophen  (PERCOCET/ROXICET) 5-325 MG tablet Take 1 tablet by mouth 3 (three) times daily as needed. 04/23/21   [provider]  potassium chloride  (KLOR-CON ) 20 MEQ packet Take by mouth 2 (two) times daily. Patient not taking: Reported on 12/15/2020    [provider]  potassium chloride  SA (KLOR-CON ) 20 MEQ tablet potassium chloride  ER 20 mEq tablet,extended release(part/cryst)    [provider]  terbinafine (LAMISIL) 250 MG tablet Take 250 mg by mouth daily.    [provider]    Allergies: Penicillins    Review of Systems  Cardiovascular:  Positive for chest pain.    Updated Vital Signs Pulse (!) 26   Temp 97.8 F (36.6 C) (Oral)   Resp 14   Ht 1.626 m (5' 4)   Wt 64.4 kg   SpO2 100%   BMI 24.37 kg/m   Physical Exam Vitals and nursing note reviewed.  Constitutional:      General:  She is not in acute distress.    Appearance: She is well-developed.  HENT:     Head: Normocephalic and atraumatic.     Right Ear: External ear normal.     Left Ear: External ear normal.  Eyes:     General: No scleral icterus.       Right eye: No discharge.        Left eye: No discharge.     Conjunctiva/sclera: Conjunctivae normal.  Neck:     Trachea: No tracheal deviation.  Cardiovascular:     Rate and Rhythm: Tachycardia present. Rhythm irregular.  Pulmonary:     Effort: Pulmonary effort is normal. No respiratory distress.     Breath sounds: Normal breath sounds. No stridor. No wheezing or rales.  Abdominal:     General: Bowel sounds are normal. There is no distension.     Palpations: Abdomen is soft.     Tenderness: There is no abdominal tenderness. There is no guarding or rebound.  Musculoskeletal:        General: No tenderness or deformity.     Cervical back: Neck supple.     Right lower leg: Edema present.     Left lower leg: Edema present.  Skin:    General: Skin is warm and dry.     Findings: No rash.  Neurological:     General: No focal deficit present.     Mental Status: She is alert.     Cranial Nerves: No cranial nerve deficit, dysarthria or facial asymmetry.     Sensory: No sensory deficit.     Motor: No abnormal muscle tone or seizure activity.     Coordination: Coordination normal.  Psychiatric:        Mood and Affect: Mood normal.     (all labs ordered are listed, but only abnormal results are displayed) Labs Reviewed  BASIC METABOLIC PANEL WITH GFR  CBC  CBG MONITORING, ED  TROPONIN T, HIGH SENSITIVITY    EKG: EKG Interpretation Date/Time:  Saturday January 04 2025 17:50:03 EST Ventricular Rate:  102 PR Interval:    QRS Duration:  102 QT Interval:  379 QTC Calculation: 494 R Axis:   20  Text Interpretation: Atrial fibrillation Ventricular premature complex Anterior infarct, old Borderline repolarization abnormality Minimal ST elevation,  inferior leads Since last tracing rate faster Confirmed by Randol Simmonds [phone] on 01/04/2025 6:04:41 PM  Radiology: No results found.  {Document cardiac monitor, telemetry assessment procedure when appropriate:32947} Ultrasound ED Echo  Date/Time: 01/04/2025 7:44 PM  Performed by: Randol Simmonds, MD Authorized by: Randol Simmonds, MD   Procedure details:    Indications: chest pain     Views: subxiphoid     Images: archived   Findings:    Pericardium: no pericardial effusion      Medications Ordered  in the ED  aspirin  chewable tablet 324 mg (has no administration in time range)  diltiazem  (CARDIZEM ) injection 10 mg (has no administration in time range)    Clinical Course as of 01/04/25 1944  Sat Jan 04, 2025  1859 Troponin T, High Sensitivity(!) Troponin increased at 29.  CBC metabolic panel normal [JK]  1859 DG Chest Portable 1 View Chest x-ray shows marked enlargement of the pericardial silhouette [JK]  1944 Blood pressure increased now 190 systolic at the bedside.  Bedside echo performed no [JK]  1944  pericardial effusion noted [JK]    Clinical Course User Index [JK] Randol Simmonds, MD   {Click here for ABCD2, HEART and other calculators REFRESH Note before signing:1}                              Medical Decision Making Amount and/or Complexity of Data Reviewed Labs: ordered. Decision-making details documented in ED Course. Radiology: ordered. Decision-making details documented in ED Course.  Risk OTC drugs. Prescription drug management.   ***  {Document critical care time when appropriate  Document review of labs and clinical decision tools ie CHADS2VASC2, etc  Document your independent review of radiology images and any outside records  Document your discussion with family members, caretakers and with consultants  Document social determinants of health affecting pt's care  Document your decision making why or why not admission, treatments were needed:32947:::1}   Final  diagnoses:  None    ED Discharge Orders     None        "

## 2025-01-05 ENCOUNTER — Emergency Department (HOSPITAL_COMMUNITY)

## 2025-01-05 LAB — PRO BRAIN NATRIURETIC PEPTIDE: Pro Brain Natriuretic Peptide: 9705 pg/mL — ABNORMAL HIGH

## 2025-01-05 MED ORDER — FUROSEMIDE 40 MG PO TABS: 40.0000 mg | ORAL_TABLET | Freq: Every day | ORAL | 0 refills | Status: DC

## 2025-01-05 MED ORDER — FUROSEMIDE 40 MG PO TABS: 40.0000 mg | ORAL_TABLET | Freq: Every day | ORAL | 0 refills | Status: AC

## 2025-01-05 NOTE — ED Notes (Signed)
 Patient transported to Ultrasound

## 2025-01-05 NOTE — ED Notes (Signed)
 Dr. Lorette (EDP) explained plan of care and discharge to patient .

## 2025-01-06 ENCOUNTER — Encounter (HOSPITAL_COMMUNITY): Payer: Self-pay | Admitting: Emergency Medicine

## 2025-01-06 ENCOUNTER — Other Ambulatory Visit: Payer: Self-pay

## 2025-01-06 ENCOUNTER — Emergency Department (HOSPITAL_COMMUNITY)
Admission: EM | Admit: 2025-01-06 | Discharge: 2025-01-06 | Discharge status: Against Medical Advice | Attending: Emergency Medicine | Admitting: Emergency Medicine

## 2025-01-06 DIAGNOSIS — R0602 Shortness of breath: Secondary | ICD-10-CM | POA: Insufficient documentation

## 2025-01-06 DIAGNOSIS — R110 Nausea: Secondary | ICD-10-CM | POA: Diagnosis not present

## 2025-01-06 DIAGNOSIS — Z5321 Procedure and treatment not carried out due to patient leaving prior to being seen by health care provider: Secondary | ICD-10-CM | POA: Diagnosis not present

## 2025-01-06 DIAGNOSIS — R1010 Upper abdominal pain, unspecified: Secondary | ICD-10-CM | POA: Insufficient documentation

## 2025-01-06 DIAGNOSIS — R0981 Nasal congestion: Secondary | ICD-10-CM | POA: Insufficient documentation

## 2025-01-06 DIAGNOSIS — R059 Cough, unspecified: Secondary | ICD-10-CM | POA: Diagnosis not present

## 2025-01-06 LAB — CBC WITH DIFFERENTIAL/PLATELET
Abs Immature Granulocytes: 0.01 K/uL (ref 0.00–0.07)
Basophils Absolute: 0 K/uL (ref 0.0–0.1)
Basophils Relative: 1 %
Eosinophils Absolute: 0.1 K/uL (ref 0.0–0.5)
Eosinophils Relative: 2 %
HCT: 39.7 % (ref 36.0–46.0)
Hemoglobin: 12.4 g/dL (ref 12.0–15.0)
Immature Granulocytes: 0 %
Lymphocytes Relative: 18 %
Lymphs Abs: 0.6 K/uL — ABNORMAL LOW (ref 0.7–4.0)
MCH: 26.9 pg (ref 26.0–34.0)
MCHC: 31.2 g/dL (ref 30.0–36.0)
MCV: 86.1 fL (ref 80.0–100.0)
Monocytes Absolute: 0.4 K/uL (ref 0.1–1.0)
Monocytes Relative: 11 %
Neutro Abs: 2.2 K/uL (ref 1.7–7.7)
Neutrophils Relative %: 68 %
Platelets: 131 K/uL — ABNORMAL LOW (ref 150–400)
RBC: 4.61 MIL/uL (ref 3.87–5.11)
RDW: 15.7 % — ABNORMAL HIGH (ref 11.5–15.5)
WBC: 3.2 K/uL — ABNORMAL LOW (ref 4.0–10.5)
nRBC: 0 % (ref 0.0–0.2)

## 2025-01-06 LAB — COMPREHENSIVE METABOLIC PANEL WITH GFR
ALT: 13 U/L (ref 0–44)
AST: 32 U/L (ref 15–41)
Albumin: 3.6 g/dL (ref 3.5–5.0)
Alkaline Phosphatase: 66 U/L (ref 38–126)
Anion gap: 9 (ref 5–15)
BUN: 11 mg/dL (ref 8–23)
CO2: 26 mmol/L (ref 22–32)
Calcium: 9.1 mg/dL (ref 8.9–10.3)
Chloride: 105 mmol/L (ref 98–111)
Creatinine, Ser: 0.78 mg/dL (ref 0.44–1.00)
GFR, Estimated: >60 mL/min
Glucose, Bld: 80 mg/dL (ref 70–99)
Potassium: 4.1 mmol/L (ref 3.5–5.1)
Sodium: 140 mmol/L (ref 135–145)
Total Bilirubin: 0.8 mg/dL (ref 0.0–1.2)
Total Protein: 6.3 g/dL — ABNORMAL LOW (ref 6.5–8.1)

## 2025-01-06 LAB — LIPASE, BLOOD: Lipase: 22 U/L (ref 11–51)

## 2025-01-06 MED ORDER — OXYCODONE-ACETAMINOPHEN 5-325 MG PO TABS
1.0000 | ORAL_TABLET | Freq: Once | ORAL | Status: AC
  Administered 2025-01-06: 1 via ORAL
  Filled 2025-01-06: qty 1

## 2025-01-06 NOTE — ED Provider Triage Note (Signed)
 Emergency Medicine Provider Triage Evaluation Note  [name] , a 84 y.o. female  was evaluated in triage.  Pt complains of abdominal pain.  Review of Systems  Positive: Abdominal pain, nausea, nasal congestion Negative: Fevers, chills, shortness of breath  Physical Exam  There were no vitals taken for this visit. Gen:   Awake, no distress   Resp:  Normal effort  MSK:   Moves extremities without difficulty    Medical Decision Making  Medically screening exam initiated at 12:57 PM.  Appropriate orders placed.  [name] was informed that the remainder of the evaluation will be completed by another provider, this initial triage assessment does not replace that evaluation, and the importance of remaining in the ED until their evaluation is complete.  Patient presenting for generalized abdominal pain.  She was seen in the ED yesterday.  At the time, she was endorsing shortness of breath.  She did undergo abdominal imaging, including CT and ultrasound.  These showed gallbladder sludge without cholecystitis.  Patient presents today for ongoing and worsening abdominal pain.  Severity is currently 7/10.   Melvenia Motto, MD 01/06/25 1259

## 2025-01-06 NOTE — ED Triage Notes (Signed)
 Pt is here with abdominal pain upper and mid that radiates down to lower abdomen with tenderness for 2 days. Seen for same complaint 2 days ago. Dry cough and mild sob with coughing. BP 138/90, p-80. R 20 spo2- 98, cbg 111

## 2025-01-06 NOTE — ED Notes (Signed)
 PT states she does not want to wait anymore.  She was here yesterday and she just wants to go home.

## 2025-01-16 ENCOUNTER — Encounter (HOSPITAL_BASED_OUTPATIENT_CLINIC_OR_DEPARTMENT_OTHER): Admitting: Internal Medicine

## 2025-01-16 DIAGNOSIS — E11622 Type 2 diabetes mellitus with other skin ulcer: Secondary | ICD-10-CM

## 2025-01-16 DIAGNOSIS — I70242 Atherosclerosis of native arteries of left leg with ulceration of calf: Secondary | ICD-10-CM

## 2025-01-16 DIAGNOSIS — L97818 Non-pressure chronic ulcer of other part of right lower leg with other specified severity: Secondary | ICD-10-CM

## 2025-01-22 ENCOUNTER — Encounter (HOSPITAL_COMMUNITY): Payer: Self-pay

## 2025-01-22 ENCOUNTER — Emergency Department (HOSPITAL_COMMUNITY)
Admission: EM | Admit: 2025-01-22 | Discharge: 2025-01-23 | Attending: Emergency Medicine | Admitting: Emergency Medicine

## 2025-01-22 ENCOUNTER — Other Ambulatory Visit: Payer: Self-pay

## 2025-01-22 DIAGNOSIS — Z5321 Procedure and treatment not carried out due to patient leaving prior to being seen by health care provider: Secondary | ICD-10-CM | POA: Diagnosis not present

## 2025-01-22 DIAGNOSIS — R109 Unspecified abdominal pain: Secondary | ICD-10-CM | POA: Insufficient documentation

## 2025-01-22 DIAGNOSIS — R600 Localized edema: Secondary | ICD-10-CM | POA: Insufficient documentation

## 2025-01-22 LAB — CBC WITH DIFFERENTIAL/PLATELET
Abs Immature Granulocytes: 0.01 10*3/uL (ref 0.00–0.07)
Basophils Absolute: 0 10*3/uL (ref 0.0–0.1)
Basophils Relative: 0 %
Eosinophils Absolute: 0 10*3/uL (ref 0.0–0.5)
Eosinophils Relative: 0 %
HCT: 47.2 % — ABNORMAL HIGH (ref 36.0–46.0)
Hemoglobin: 14.5 g/dL (ref 12.0–15.0)
Immature Granulocytes: 0 %
Lymphocytes Relative: 9 %
Lymphs Abs: 0.6 10*3/uL — ABNORMAL LOW (ref 0.7–4.0)
MCH: 26.6 pg (ref 26.0–34.0)
MCHC: 30.7 g/dL (ref 30.0–36.0)
MCV: 86.4 fL (ref 80.0–100.0)
Monocytes Absolute: 0.5 10*3/uL (ref 0.1–1.0)
Monocytes Relative: 9 %
Neutro Abs: 4.9 10*3/uL (ref 1.7–7.7)
Neutrophils Relative %: 82 %
Platelets: 124 10*3/uL — ABNORMAL LOW (ref 150–400)
RBC: 5.46 MIL/uL — ABNORMAL HIGH (ref 3.87–5.11)
RDW: 15.8 % — ABNORMAL HIGH (ref 11.5–15.5)
WBC: 6 10*3/uL (ref 4.0–10.5)
nRBC: 0 % (ref 0.0–0.2)

## 2025-01-22 LAB — BASIC METABOLIC PANEL WITH GFR
Anion gap: 12 (ref 5–15)
BUN: 17 mg/dL (ref 8–23)
CO2: 26 mmol/L (ref 22–32)
Calcium: 9.4 mg/dL (ref 8.9–10.3)
Chloride: 105 mmol/L (ref 98–111)
Creatinine, Ser: 0.87 mg/dL (ref 0.44–1.00)
GFR, Estimated: >60 mL/min
Glucose, Bld: 98 mg/dL (ref 70–99)
Potassium: 4.5 mmol/L (ref 3.5–5.1)
Sodium: 143 mmol/L (ref 135–145)

## 2025-01-22 NOTE — ED Provider Triage Note (Signed)
 Emergency Medicine Provider Triage Evaluation Note  [name] , a 84 y.o. female  was evaluated in triage.  Pt complains of bilateral lower extremity edema.  She has had bilateral lower extremity edema.  Had developed an ulceration to the right leg.  Is seen by wound care nurse that sees her twice a week for this.  She was seen today and there was some concern that the left leg was no developing a wound and so she was told to come here for evaluation..  Review of Systems  Positive: Leg edema Negative:   Physical Exam  BP (!) 152/112   Pulse (!) 50   Temp 97.8 F (36.6 C) (Oral)   Resp 16   Ht 5' 5 (1.651 m)   Wt 63.5 kg   SpO2 92%   BMI 23.30 kg/m  Gen:   Awake, no distress   Resp:  Normal effort  MSK:   Moves extremities without difficulty  Other:  Bilateral lower extremity edema perhaps left greater than right.  Old appearing ulceration to the posterior aspect of the right lower leg.  Multiple punctate lesions to the left lower extremity.  Wound with signs of drainage.  No significant erythema or warmth for me.  Medical Decision Making  Medically screening exam initiated at 2:13 PM.  Appropriate orders placed.  [name] was informed that the remainder of the evaluation will be completed by another provider, this initial triage assessment does not replace that evaluation, and the importance of remaining in the ED until their evaluation is complete.     Emil Share, DO 01/22/25 1414

## 2025-01-22 NOTE — ED Triage Notes (Signed)
 Pt BIB [facility] from home d/t Lt lower leg edema the last 2 days, also reports she noticed some yellow drainage from that leg yesterday & is not aware of any recent bug bites or injuries in that leg. Also endorses some recent abd pain, A/Ox4, no PIV SBP 156, denies n/v/d.

## 2025-01-23 ENCOUNTER — Encounter: Payer: Self-pay | Admitting: Vascular Surgery

## 2025-01-23 ENCOUNTER — Ambulatory Visit: Admitting: Vascular Surgery

## 2025-01-23 VITALS — BP 134/73 | HR 61 | Temp 97.8°F | Resp 20 | Ht <= 58 in | Wt 140.0 lb

## 2025-01-23 DIAGNOSIS — L97909 Non-pressure chronic ulcer of unspecified part of unspecified lower leg with unspecified severity: Secondary | ICD-10-CM | POA: Diagnosis not present

## 2025-01-23 DIAGNOSIS — I998 Other disorder of circulatory system: Secondary | ICD-10-CM | POA: Diagnosis not present

## 2025-01-23 NOTE — Progress Notes (Signed)
 " Office Note     CC: Chronic bilateral lower extremity wounds Requesting Provider:  Rollene Almarie LABOR, MD  HPI: [name] is a 84 y.o. ([birth year]) female presenting at the request of .Nguyen, Kim, NP for evaluation of chronic bilateral lower extremity wounds.  On exam, [name] was doing well, accompanied by her granddaughter.  A native of [place] , she has lived in [place] for several decades.  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  She states that she elevates her feet, but per her granddaughter, she only elevates them about 4 inches off of the ground.  She has had wounds to bilateral lower extremities for 9 months.  These wounds are above the malleolus and are most consistent with venous ulcerations.  She is currently having [facility] come to her home twice a week to manage the wounds.  She has never had Unna boot therapy.  Denies symptoms of claudication, ischemic rest pain, tissue loss at the toes   Past Medical History:  Diagnosis Date   Acute MI (HCC)    Arthritis    Asthma    Atrial fibrillation (HCC)    Diabetes mellitus    DVT (deep venous thrombosis) (HCC)    Gout    Gout    Stroke [facility])     Past Surgical History:  Procedure Laterality Date   BACK SURGERY Left 2003   left knee replacement and left shoulder   BREAST EXCISIONAL BIOPSY Left 1983   FOOT SURGERY     JOINT REPLACEMENT     SHOULDER SURGERY      Social History   Socioeconomic History   Marital status: Divorced    Spouse name: Not on file   Number of children: Not on file   Years of education: Not on file   Highest education level: Not on file  Occupational History   Not on file  Tobacco Use   Smoking status: Former    Current packs/day: 0.25    Average packs/day: 0.3 packs/day for 30.0 years (7.5 ttl pk-yrs)    Types: Cigarettes   Smokeless tobacco: Former    Quit date: 06/13/2004    Tobacco comments:    Quit when she went to nursing home/ states she light smoker; Only smoked periodically; Pack years undetermined  Substance and Sexual Activity   Alcohol use: Yes    Alcohol/week: 1.0 standard drink of alcohol    Types: 1 Standard drinks or equivalent per week    Comment: Occ.   Drug use: Not Currently    Types: Marijuana    Comment: occasionally   Sexual activity: Not Currently  Other Topics Concern   Not on file  Social History Narrative   Lives alone; Lives apt; safe area.   3 children; adopted 2 grandsons.   Have 20 great-grandchildren   Some live in [place]; some in [place] and one in [place]   Dtr lives in [place] and son is in [place]   Wears a lifeline   Social Drivers of Health   Tobacco Use: Medium Risk (01/22/2025)   Patient History    Smoking Tobacco Use: Former    Smokeless Tobacco Use: Former    Passive Exposure: Not on Actuary Strain: Low Risk (03/03/2023)   Received from [facility]   Overall Financial Resource Strain (CARDIA)    Difficulty of Paying Living Expenses: Not hard at  all  Food Insecurity: Food Insecurity Present (03/03/2023)   Received from [facility]   Epic    Within the past 12 months, you worried that your food would run out before you got the money to buy more.: Sometimes true    Within the past 12 months, the food you bought just didn't last and you didn't have money to get more.: Sometimes true  Transportation Needs: No Transportation Needs (03/03/2023)   Received from [facility]   PRAPARE - Transportation    Lack of Transportation (Medical): No    Lack of Transportation (Non-Medical): No  Physical Activity: Not on file  Stress: Not on file  Social Connections: Not on file  Intimate Partner Violence: Not on file  Depression (EYV7-0): Not on file  Alcohol Screen: Not on file  Housing: Not on file  Utilities: Not At Risk (03/03/2023)   Received from [facility] Utilities     Threatened with loss of utilities: No  Health Literacy: Not on file   Family History  Problem Relation Age of Onset   Cancer Brother    Cancer Mother    Breast cancer Mother 54    Current Outpatient Medications  Medication Sig Dispense Refill   acetaminophen  (TYLENOL ) 500 MG tablet Take 1 tablet (500 mg total) by mouth every 6 (six) hours as needed. 30 tablet 0   albuterol  (VENTOLIN  HFA) 108 (90 Base) MCG/ACT inhaler Inhale 2 puffs into the lungs every 6 (six) hours as needed for wheezing or shortness of breath (SOB).     apixaban  (ELIQUIS ) 5 MG TABS tablet Take 1 tablet (5 mg total) by mouth 2 (two) times daily. 180 tablet 0   colchicine 0.6 MG tablet Take 0.6 mg by mouth 2 (two) times daily.     diclofenac Sodium (VOLTAREN) 1 % GEL Voltaren 1 % topical gel  APPLY 2 GRAMS TO THE AFFECTED AREA(S) BY TOPICAL ROUTE 4 TIMES PER DAY     Fluticasone-Salmeterol (ADVAIR) 100-50 MCG/DOSE AEPB Inhale 1 puff into the lungs 2 (two) times daily.     furosemide  (LASIX ) 40 MG tablet Take 1 tablet (40 mg total) by mouth daily. THEN as needed for leg swelling. 30 tablet 0   loratadine  (CLARITIN ) 10 MG tablet TAKE 1 TABLET (10 MG TOTAL) BY MOUTH DAILY AS NEEDED FOR ALLERGIES. (Patient not taking: Reported on 03/29/2019) 90 tablet 1   losartan  (COZAAR ) 25 MG tablet Take 25 mg by mouth daily.     losartan  (COZAAR ) 50 MG tablet Take 50 mg by mouth daily.     losartan  (COZAAR ) 50 MG tablet Take 1 tablet (50 mg total) by mouth daily. 30 tablet 0   metFORMIN  (GLUCOPHAGE ) 500 MG tablet TAKE 1 TABLET (500 MG TOTAL) BY MOUTH DAILY WITH BREAKFAST. 90 tablet 1   metoprolol  succinate (TOPROL -XL) 50 MG 24 hr tablet metoprolol  succinate ER 50 mg tablet,extended release 24 hr     metoprolol  tartrate (LOPRESSOR ) 50 MG tablet Take 50 mg by mouth 2 (two) times daily.     metoprolol  tartrate (LOPRESSOR ) 50 MG tablet Take 1 tablet (50 mg total) by mouth 2 (two) times daily. 60 tablet 0   oxyCODONE -acetaminophen   (PERCOCET/ROXICET) 5-325 MG tablet Take 1 tablet by mouth 3 (three) times daily as needed.     potassium chloride  (KLOR-CON ) 20 MEQ packet Take by mouth 2 (two) times daily. (Patient not taking: Reported on 12/15/2020)     potassium chloride  SA (KLOR-CON ) 20 MEQ tablet potassium chloride  ER 20  mEq tablet,extended release(part/cryst)     terbinafine (LAMISIL) 250 MG tablet Take 250 mg by mouth daily.     No current facility-administered medications for this visit.    Allergies[1]   REVIEW OF SYSTEMS:  [X]  denotes positive finding, [ ]  denotes negative finding Cardiac  Comments:  Chest pain or chest pressure:    Shortness of breath upon exertion:    Short of breath when lying flat:    Irregular heart rhythm:        Vascular    Pain in calf, thigh, or hip brought on by ambulation:    Pain in feet at night that wakes you up from your sleep:     Blood clot in your veins:    Leg swelling:         Pulmonary    Oxygen at home:    Productive cough:     Wheezing:         Neurologic    Sudden weakness in arms or legs:     Sudden numbness in arms or legs:     Sudden onset of difficulty speaking or slurred speech:    Temporary loss of vision in one eye:     Problems with dizziness:         Gastrointestinal    Blood in stool:     Vomited blood:         Genitourinary    Burning when urinating:     Blood in urine:        Psychiatric    Major depression:         Hematologic    Bleeding problems:    Problems with blood clotting too easily:        Skin    Rashes or ulcers:        Constitutional    Fever or chills:      PHYSICAL EXAMINATION:  There were no vitals filed for this visit.  General:  WDWN in NAD; vital signs documented above Gait: Not observed HENT: WNL, normocephalic Pulmonary: normal non-labored breathing , without wheezing Cardiac: regular HR Abdomen: soft, NT, no masses Skin: without rashes Vascular Exam/Pulses:  Right Left  Radial 2+ (normal) 2+  (normal)  Ulnar    Femoral    Popliteal    DP absent absent  PT     Extremities: without ischemic changes, without Gangrene , without cellulitis; without open wounds;  Musculoskeletal: no muscle wasting or atrophy  Neurologic: A&O X 3;  No focal weakness or paresthesias are detected Psychiatric:  The pt has Normal affect.   Non-Invasive Vascular Imaging:    ABI Findings:  +---------+------------------+-----+-------------------+--------+  Right   Rt Pressure (mmHg)IndexWaveform           Comment   +---------+------------------+-----+-------------------+--------+  Brachial 175                                                 +---------+------------------+-----+-------------------+--------+  PTA     172               0.98 dampened monophasic          +---------+------------------+-----+-------------------+--------+  DP      166               0.95 multiphasic                  +---------+------------------+-----+-------------------+--------+  Great Toe23                0.13 Abnormal                     +---------+------------------+-----+-------------------+--------+   +---------+------------------+-----+-----------+-------+  Left    Lt Pressure (mmHg)IndexWaveform   Comment  +---------+------------------+-----+-----------+-------+  Brachial 172                                        +---------+------------------+-----+-----------+-------+  PTA     201               1.15 multiphasic         +---------+------------------+-----+-----------+-------+  DP      206               1.18 multiphasic         +---------+------------------+-----+-----------+-------+  Great Toe59                0.34 Abnormal            +---------+------------------+-----+-----------+-------+   +-------+-----------+-----------+------------+------------+  ABI/TBIToday's ABIToday's TBIPrevious ABIPrevious TBI   +-------+-----------+-----------+------------+------------+  Right .98        .13        .84         .27           +-------+-----------+-----------+------------+------------+  Left  1.18       .34        .94         .42           +-------+-----------+-----------+------------+------------+        +-----------+--------+-----+--------+----------+--------+  RIGHT     PSV cm/sRatioStenosisWaveform  Comments  +-----------+--------+-----+--------+----------+--------+  CFA Prox   135                  triphasic           +-----------+--------+-----+--------+----------+--------+  DFA       121                  biphasic            +-----------+--------+-----+--------+----------+--------+  SFA Prox   96                   triphasic           +-----------+--------+-----+--------+----------+--------+  SFA Mid    95                   triphasic           +-----------+--------+-----+--------+----------+--------+  SFA Distal 118                  triphasic           +-----------+--------+-----+--------+----------+--------+  POP Prox   119                  triphasic           +-----------+--------+-----+--------+----------+--------+  POP Mid    51                   triphasic           +-----------+--------+-----+--------+----------+--------+  POP Distal 41                   triphasic           +-----------+--------+-----+--------+----------+--------+  TP Trunk   33  triphasic           +-----------+--------+-----+--------+----------+--------+  ATA Prox   35                   monophasic          +-----------+--------+-----+--------+----------+--------+  ATA Mid    0            occluded                    +-----------+--------+-----+--------+----------+--------+  ATA Distal 0            occluded                    +-----------+--------+-----+--------+----------+--------+  PTA Prox    82                   monophasic          +-----------+--------+-----+--------+----------+--------+  PTA Mid    0            occluded                    +-----------+--------+-----+--------+----------+--------+  PTA Distal 0            occluded                    +-----------+--------+-----+--------+----------+--------+  PERO Prox  71                   triphasic           +-----------+--------+-----+--------+----------+--------+  PERO Mid   50                   triphasic           +-----------+--------+-----+--------+----------+--------+  PERO Distal49                   triphasic           +-----------+--------+-----+--------+----------+--------+      ASSESSMENT/PLAN: [name] is a 84 y.o. female presenting with bilateral lower extremity mixed arterial venous disease.  On physical exam, she had nonpalpable pulses at the feet.  Wounds were above the malleolus most indicative of venous ulcerations.  She had significant edema as well with active serous drainage.  ABI was reviewed.  This was found to be depressed.  She does not have a toe pressure consistent with wound healing.  Furthermore, recent right lower extremity arterial duplex ultrasound study demonstrates normal artery to the level of the popliteal artery, however severely diminished outflow which appears to be single-vessel peroneal.  The wound did not heal in the 9 months.  I think that she would be best served with bilateral lower extremity angiogram with possible intervention with emphasis on the right as the toe pressure is lower, and the wound is larger.  After discussing the risks and benefits, [name] elected to proceed   Fonda FORBES Rim, MD [facility] [phone]     [1]  Allergies Allergen Reactions   Penicillins Other (See Comments)    Has patient had a PCN reaction causing immediate rash, facial/tongue/throat swelling, SOB or lightheadedness  with hypotension: unknown Has patient had a PCN reaction causing severe rash involving mucus membranes or skin necrosis:unknown Has patient had a PCN reaction that required hospitalization unknown Has patient had a PCN reaction occurring within the last 10 years: unknown If all of the above answers are NO, then may proceed with Cephalosporin use.    "

## 2025-01-24 ENCOUNTER — Other Ambulatory Visit: Payer: Self-pay

## 2025-01-24 DIAGNOSIS — L97909 Non-pressure chronic ulcer of unspecified part of unspecified lower leg with unspecified severity: Secondary | ICD-10-CM

## 2025-01-27 LAB — CULTURE, BLOOD (ROUTINE X 2): Culture: NO GROWTH

## 2025-01-30 ENCOUNTER — Encounter (HOSPITAL_BASED_OUTPATIENT_CLINIC_OR_DEPARTMENT_OTHER): Admitting: Internal Medicine

## 2025-02-05 ENCOUNTER — Ambulatory Visit (HOSPITAL_COMMUNITY): Admission: RE | Admit: 2025-02-05 | Admitting: Vascular Surgery

## 2025-02-05 ENCOUNTER — Encounter (HOSPITAL_COMMUNITY): Admission: RE | Payer: Self-pay

## 2025-02-06 ENCOUNTER — Encounter (HOSPITAL_BASED_OUTPATIENT_CLINIC_OR_DEPARTMENT_OTHER): Admitting: Internal Medicine
